# Patient Record
Sex: Female | Born: 1985 | Race: Black or African American | Hispanic: No | Marital: Single | Smoking: Current every day smoker
Health system: Southern US, Community
[De-identification: ages and names within clinical notes are randomized; demographics above are authoritative.]

## PROBLEM LIST (undated history)

## (undated) ENCOUNTER — Ambulatory Visit (HOSPITAL_COMMUNITY): Payer: Medicaid Other

## (undated) DIAGNOSIS — N200 Calculus of kidney: Secondary | ICD-10-CM

## (undated) DIAGNOSIS — J45909 Unspecified asthma, uncomplicated: Secondary | ICD-10-CM

## (undated) DIAGNOSIS — Z7289 Other problems related to lifestyle: Secondary | ICD-10-CM

## (undated) DIAGNOSIS — F32A Depression, unspecified: Secondary | ICD-10-CM

## (undated) DIAGNOSIS — N809 Endometriosis, unspecified: Secondary | ICD-10-CM

## (undated) DIAGNOSIS — I10 Essential (primary) hypertension: Secondary | ICD-10-CM

## (undated) DIAGNOSIS — R040 Epistaxis: Secondary | ICD-10-CM

## (undated) HISTORY — PX: CHOLECYSTECTOMY: SHX55

## (undated) HISTORY — DX: Depression, unspecified: F32.A

## (undated) HISTORY — PX: ABDOMINAL HYSTERECTOMY: SHX81

## (undated) HISTORY — PX: OTHER SURGICAL HISTORY: SHX169

---

## 2011-09-15 ENCOUNTER — Emergency Department (HOSPITAL_COMMUNITY)
Admission: EM | Admit: 2011-09-15 | Discharge: 2011-09-16 | Disposition: A | Discharge status: Home / Self Care | Payer: Medicaid Other | Attending: Emergency Medicine | Admitting: Emergency Medicine

## 2011-09-15 ENCOUNTER — Encounter (HOSPITAL_COMMUNITY): Payer: Self-pay | Admitting: *Deleted

## 2011-09-15 DIAGNOSIS — Q308 Other congenital malformations of nose: Secondary | ICD-10-CM | POA: Insufficient documentation

## 2011-09-15 DIAGNOSIS — R51 Headache: Secondary | ICD-10-CM | POA: Insufficient documentation

## 2011-09-15 DIAGNOSIS — J3489 Other specified disorders of nose and nasal sinuses: Secondary | ICD-10-CM | POA: Insufficient documentation

## 2011-09-15 DIAGNOSIS — R040 Epistaxis: Secondary | ICD-10-CM | POA: Insufficient documentation

## 2011-09-15 DIAGNOSIS — I10 Essential (primary) hypertension: Secondary | ICD-10-CM | POA: Insufficient documentation

## 2011-09-15 MED ORDER — ACETAMINOPHEN 325 MG PO TABS
650.0000 mg | ORAL_TABLET | Freq: Once | ORAL | Status: AC
  Administered 2011-09-16: 650 mg via ORAL
  Filled 2011-09-15: qty 2

## 2011-09-15 MED ORDER — PSEUDOEPHEDRINE HCL ER 120 MG PO TB12: 120.0000 mg | ORAL_TABLET | Freq: Two times a day (BID) | ORAL | Status: AC

## 2011-09-15 MED ORDER — OXYMETAZOLINE HCL 0.05 % NA SOLN: 2.0000 | NASAL | Status: AC | PRN

## 2011-09-15 NOTE — ED Notes (Signed)
Nose bleeding  All day x3.  None at present.  She has had a cold for the past few days.  Headache also

## 2011-09-15 NOTE — ED Notes (Addendum)
No bleeding from the nose at this time. Pt not in any kind of distress. Alert x4.

## 2011-09-16 NOTE — ED Provider Notes (Signed)
Medical screening examination/treatment/procedure(s) were performed by non-physician practitioner and as supervising physician I was immediately available for consultation/collaboration.  Loren Racer, MD 09/16/11 2317

## 2011-09-16 NOTE — ED Provider Notes (Signed)
History     CSN: [id number]  Arrival date & time 09/15/11  2205   First MD Initiated Contact with Patient 09/15/11 2308      Chief Complaint  Patient presents with  . Epistaxis    (Consider location/radiation/quality/duration/timing/severity/associated sxs/prior treatment) HPI History provided by pt.   Pt had three brief episodes of epistaxis today.  Has never had a nose bleed in the past.  Associated w/ pain in her nose and left frontal headache.  Denies trauma.  Has had sinus pressure, nasal congestion and rhinorrhea for the past week.  Is not anti-coagulated.    History reviewed. No pertinent past medical history.  Past Surgical History  Procedure Date  . Cholecystectomy     History reviewed. No pertinent family history.  History  Substance Use Topics  . Smoking status: Current Everyday Smoker  . Smokeless tobacco: Not on file  . Alcohol Use: Yes    OB History    Grav Para Term Preterm Abortions TAB SAB Ect Mult Living                  Review of Systems  All other systems reviewed and are negative.    Allergies  Review of patient's allergies indicates no known allergies.  Home Medications   Current Outpatient Rx  Name Route Sig Dispense Refill  . NYQUIL COLD & FLU PO Oral Take 2 tablets by mouth at bedtime as needed. For cold and sinus symptoms    . OXYMETAZOLINE HCL 0.05 % NA SOLN Nasal Place 2 sprays into the nose as needed (nose bleed). 30 mL 0  . PSEUDOEPHEDRINE HCL ER 120 MG PO TB12 Oral Take 1 tablet (120 mg total) by mouth every 12 (twelve) hours. 20 tablet 0    BP 140/85  Pulse 82  Temp(Src) 98.3 F (36.8 C) (Oral)  Resp 20  SpO2 99%  LMP 08/29/2011  Physical Exam  Nursing note and vitals reviewed. Constitutional: She is oriented to person, place, and time. She appears well-developed and well-nourished. No distress.  HENT:  Head: Normocephalic and atraumatic.       Pt has a congenital deformity of nose.  Mild tenderness at bridge of  nose as well as frontal, ethmoid and maxillary sinuses.  No active epistaxis and nasal mucosa appears normal.    Eyes:       Normal appearance  Neck: Normal range of motion.  Cardiovascular: Normal rate and regular rhythm.   Pulmonary/Chest: Effort normal and breath sounds normal.  Neurological: She is alert and oriented to person, place, and time.  Psychiatric: She has a normal mood and affect. Her behavior is normal.    ED Course  Procedures (including critical care time)  Labs Reviewed - No data to display No results found.   1. Epistaxis   2. Hypertension       MDM  Pt presents w/ intermittent epistaxis today. No trauma.  No active epistaxis on exam.  Has s/sx consistent w/ sinusitis, likely viral based on duration. Explained to patient how to control a nose bleed.  Recommended sudafed for nasal and sinus congestion.  Also discussed complications of uncontrolled HTN and advised compliance with her medications.  Referred to ENT in case of persistent sx.  Return precautions discussed.         Arie Sabina Mosses, [place] 09/16/11 1515

## 2013-05-18 ENCOUNTER — Emergency Department (HOSPITAL_COMMUNITY)
Admission: EM | Admit: 2013-05-18 | Discharge: 2013-05-19 | Disposition: A | Discharge status: Home / Self Care | Payer: Medicaid Other | Attending: Emergency Medicine | Admitting: Emergency Medicine

## 2013-05-18 DIAGNOSIS — S51809A Unspecified open wound of unspecified forearm, initial encounter: Secondary | ICD-10-CM | POA: Insufficient documentation

## 2013-05-18 DIAGNOSIS — Z7289 Other problems related to lifestyle: Secondary | ICD-10-CM

## 2013-05-18 DIAGNOSIS — S61509A Unspecified open wound of unspecified wrist, initial encounter: Secondary | ICD-10-CM | POA: Insufficient documentation

## 2013-05-18 DIAGNOSIS — X789XXA Intentional self-harm by unspecified sharp object, initial encounter: Secondary | ICD-10-CM | POA: Insufficient documentation

## 2013-05-18 DIAGNOSIS — Z3202 Encounter for pregnancy test, result negative: Secondary | ICD-10-CM | POA: Insufficient documentation

## 2013-05-18 LAB — CBC WITH DIFFERENTIAL/PLATELET
Eosinophils Relative: 2 % (ref 0–5)
HCT: 41.3 % (ref 36.0–46.0)
Lymphocytes Relative: 32 % (ref 12–46)
Lymphs Abs: 2.4 10*3/uL (ref 0.7–4.0)
MCV: 82.1 fL (ref 78.0–100.0)
Monocytes Absolute: 0.4 10*3/uL (ref 0.1–1.0)
RDW: 13.9 % (ref 11.5–15.5)
WBC: 7.4 10*3/uL (ref 4.0–10.5)

## 2013-05-18 LAB — RAPID URINE DRUG SCREEN, HOSP PERFORMED
Cocaine: NOT DETECTED
Opiates: NOT DETECTED

## 2013-05-18 LAB — COMPREHENSIVE METABOLIC PANEL
BUN: 9 mg/dL (ref 6–23)
CO2: 27 mEq/L (ref 19–32)
Calcium: 9.9 mg/dL (ref 8.4–10.5)
Creatinine, Ser: 0.91 mg/dL (ref 0.50–1.10)
GFR calc Af Amer: >90 mL/min (ref >90)
GFR calc non Af Amer: 86 mL/min — ABNORMAL LOW (ref >90)
Glucose, Bld: 104 mg/dL — ABNORMAL HIGH (ref 70–99)

## 2013-05-18 LAB — POCT PREGNANCY, URINE: Preg Test, Ur: NEGATIVE

## 2013-05-18 MED ORDER — ONDANSETRON HCL 4 MG PO TABS: 4.0000 mg | ORAL_TABLET | Freq: Three times a day (TID) | ORAL | Status: DC | PRN

## 2013-05-18 MED ORDER — ACETAMINOPHEN 325 MG PO TABS: 650.0000 mg | ORAL_TABLET | ORAL | Status: DC | PRN

## 2013-05-18 MED ORDER — IBUPROFEN 200 MG PO TABS: 600.0000 mg | ORAL_TABLET | Freq: Three times a day (TID) | ORAL | Status: DC | PRN

## 2013-05-18 MED ORDER — ALUM & MAG HYDROXIDE-SIMETH 200-200-20 MG/5ML PO SUSP: 30.0000 mL | ORAL | Status: DC | PRN

## 2013-05-18 MED ORDER — LORAZEPAM 1 MG PO TABS: 1.0000 mg | ORAL_TABLET | Freq: Three times a day (TID) | ORAL | Status: DC | PRN

## 2013-05-18 MED ORDER — ZOLPIDEM TARTRATE 5 MG PO TABS: 5.0000 mg | ORAL_TABLET | Freq: Every evening | ORAL | Status: DC | PRN

## 2013-05-18 MED ORDER — NICOTINE 21 MG/24HR TD PT24: 21.0000 mg | MEDICATED_PATCH | Freq: Every day | TRANSDERMAL | Status: DC | PRN

## 2013-05-18 NOTE — ED Notes (Signed)
EMS called to home.  Patient found on bathroom floor with response to verbal after drinking a whole  Bottle on Nyquil and cutting her wrist.  Stating she wants her husband to get away from her.

## 2013-05-18 NOTE — ED Provider Notes (Signed)
CSN: [id number]     Arrival date & time 05/18/13  2055 History   First MD Initiated Contact with Patient 05/18/13 2102     Chief Complaint  Patient presents with  . Suicide Attempt    nyquil   (Consider location/radiation/quality/duration/timing/severity/associated sxs/prior Treatment) The history is provided by the patient and medical records.   Patient presents to the ED via EMS for OD and self mutilation. Patient is very limited in her responses, mumbling a few words but does not give any details of the events from earlier tonight. Per EMS report patient found on her bathroom floor by her fianc with a razor blade and into bottle of NyQuil.  Patient states she did cut her left wrist and forearm, but states she did it to "calm herself down" not as a suicide attempt. She continuously states "I should not even be here".  I have asked several times what she means by that statement but she will not give further details.  Denies HI or AVH.  States no EtOH or illicit drug use today.  Tetanus UTD.  Spoke with the patient's fiance, states events from earlier occurred after a telephone conversation with her brother.  Fiance states she has no history of prior suicidal attempts. No history of anxiety or depression. States when she is under a great deal of stress, which she has been recently, she tends to isolate herself and will not talk with anyone.  States she has no known issues with EtOH or illicit drug abuse.  No past medical history on file. No past surgical history on file. No family history on file. History  Substance Use Topics  . Smoking status: Not on file  . Smokeless tobacco: Not on file  . Alcohol Use: Not on file   OB History   No data available     Review of Systems  Psychiatric/Behavioral: Positive for self-injury.       OD  All other systems reviewed and are negative.    Allergies  Review of patient's allergies indicates not on file.  Home Medications  No current  outpatient prescriptions on file. BP 155/101  Temp(Src) 98.7 F (37.1 C) (Oral)  Resp 15  SpO2 100%  Physical Exam  Nursing note and vitals reviewed. Constitutional: She is oriented to person, place, and time. She appears well-developed and well-nourished. No distress.  HENT:  Head: Normocephalic and atraumatic.  Mouth/Throat: Oropharynx is clear and moist.  Airway patent  Eyes: Conjunctivae and EOM are normal. Pupils are equal, round, and reactive to light.  Neck: Normal range of motion.  Cardiovascular: Normal rate, regular rhythm and normal heart sounds.   Pulmonary/Chest: Effort normal and breath sounds normal. No respiratory distress. She has no wheezes.  Abdominal: Soft. Bowel sounds are normal. There is no tenderness. There is no guarding.  Musculoskeletal: Normal range of motion.  Multiple superficial scratches to left wrist and forearm; bleeding minimal; no surrounding swelling, erythema, induration, or signs of cellulitis; strong radial pulse, sensation intact  Neurological: She is alert and oriented to person, place, and time. She has normal strength. She displays no tremor. No cranial nerve deficit or sensory deficit. She displays no seizure activity.  CN grossly intact, moves all extremities appropriately without ataxia, no focal neuro deficits or facial droop appreciated  Skin: Skin is warm and dry. She is not diaphoretic.  Psychiatric: She is not actively hallucinating. Thought content is not delusional. She expresses suicidal ideation. She expresses no homicidal ideation. She expresses suicidal plans.  She expresses no homicidal plans.  Pt tearful, limited responses to questioning, staring at her left arm; denies HI or AVH    ED Course  Procedures (including critical care time)   Date: 05/18/2013  Rate: 102  Rhythm: sinus tachycardia  QRS Axis: normal  Intervals: normal  ST/T Wave abnormalities: normal  Conduction Disutrbances:none  Narrative Interpretation:    Old EKG Reviewed: none available   Labs Review Labs Reviewed  COMPREHENSIVE METABOLIC PANEL - Abnormal; Notable for the following:    Potassium 3.4 (*)    Glucose, Bld 104 (*)    Total Bilirubin 0.2 (*)    GFR calc non Af Amer 86 (*)    All other components within normal limits  URINE RAPID DRUG SCREEN (HOSP PERFORMED) - Abnormal; Notable for the following:    Tetrahydrocannabinol POSITIVE (*)    All other components within normal limits  SALICYLATE LEVEL - Abnormal; Notable for the following:    Salicylate Lvl <2.0 (*)    All other components within normal limits  CBC WITH DIFFERENTIAL  ETHANOL  ACETAMINOPHEN LEVEL  POCT PREGNANCY, URINE   Imaging Review No results found.  MDM  No diagnosis found.  Labs as above-- ethanol WNL.  UDS + for cannabis.  Wounds cleansed and bandaged.  Pt has been resting comfortably in bed, VS remained stable.  Pt medically cleared.  Pt denies SI but given events that occurred, will obtain psych consult.  TTS contacted, they will see her.  Psych holding orders placed.  VS stable.  Garlon Hatchet, PA-C 05/19/13 0058  Garlon Hatchet, PA-C 05/19/13 0100

## 2013-05-18 NOTE — ED Notes (Signed)
Patient is lethargic and unwilling to answer questions.  She will make small murmuring comments, but they have no information on how or why she is here.

## 2013-05-18 NOTE — ED Notes (Signed)
Bed: RESA Expected date:  Expected time:  Means of arrival:  Comments: EMS overdose/SI 

## 2013-05-18 NOTE — ED Notes (Signed)
Patient wanded by security and belongings searched.  Lockers full.  Patient belongings at nurses station

## 2013-05-19 ENCOUNTER — Encounter (HOSPITAL_COMMUNITY): Payer: Self-pay | Admitting: *Deleted
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
## 2013-05-19 NOTE — ED Provider Notes (Signed)
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 She should cut herself superficially today and drank some NyQuil. She states was to calm herself down. She has has a history of cutting but no previous suicide attempt. Patient has information for followup at day [facility]. Will discharge. Does not appear to be in acute risk for self at this time.  Samantha Yoder. Rubin Payor, MD 05/19/13 [phone]

## 2013-05-19 NOTE — ED Notes (Signed)
Patient unable to ambulate without assistance.   Will move to phych ED with she is able to ambulate

## 2013-05-19 NOTE — ED Provider Notes (Signed)
Medical screening examination/treatment/procedure(s) were performed by non-physician practitioner and as supervising physician I was immediately available for consultation/collaboration.    Gilda Crease, MD 05/19/13 [phone]

## 2013-05-19 NOTE — ED Notes (Signed)
Bed: WA30 Expected date:  Expected time:  Means of arrival:  Comments: 

## 2013-11-02 ENCOUNTER — Encounter (HOSPITAL_COMMUNITY): Payer: Self-pay | Admitting: Emergency Medicine

## 2013-11-02 ENCOUNTER — Emergency Department (HOSPITAL_COMMUNITY)
Admission: EM | Admit: 2013-11-02 | Discharge: 2013-11-02 | Discharge status: Against Medical Advice | Payer: Medicaid Other | Attending: Emergency Medicine | Admitting: Emergency Medicine

## 2013-11-02 ENCOUNTER — Emergency Department (HOSPITAL_COMMUNITY): Payer: Medicaid Other

## 2013-11-02 DIAGNOSIS — J45909 Unspecified asthma, uncomplicated: Secondary | ICD-10-CM | POA: Insufficient documentation

## 2013-11-02 DIAGNOSIS — F17200 Nicotine dependence, unspecified, uncomplicated: Secondary | ICD-10-CM | POA: Insufficient documentation

## 2013-11-02 DIAGNOSIS — I10 Essential (primary) hypertension: Secondary | ICD-10-CM | POA: Insufficient documentation

## 2013-11-02 DIAGNOSIS — N898 Other specified noninflammatory disorders of vagina: Secondary | ICD-10-CM | POA: Insufficient documentation

## 2013-11-02 HISTORY — DX: Unspecified asthma, uncomplicated: J45.909

## 2013-11-02 HISTORY — DX: Essential (primary) hypertension: I10

## 2013-11-02 NOTE — ED Notes (Signed)
Pt updated.  PT states she is leaving d/t the wait and will come back tomorrow morning.

## 2013-11-02 NOTE — ED Notes (Signed)
Per pt sts that she has a vaginal infection with thick white discharge and foul odor. sts also that her BP has been up the last couple of days. sts she hasn't had her BP meds in 1 month. sts also cough and congestion and is out of her albuterol.

## 2013-11-06 ENCOUNTER — Emergency Department (HOSPITAL_COMMUNITY): Payer: Medicaid Other

## 2013-11-06 ENCOUNTER — Encounter (HOSPITAL_COMMUNITY): Payer: Self-pay | Admitting: Emergency Medicine

## 2013-11-06 ENCOUNTER — Emergency Department (HOSPITAL_COMMUNITY)
Admission: EM | Admit: 2013-11-06 | Discharge: 2013-11-06 | Disposition: A | Discharge status: Home / Self Care | Payer: Medicaid Other | Attending: Emergency Medicine | Admitting: Emergency Medicine

## 2013-11-06 DIAGNOSIS — I10 Essential (primary) hypertension: Secondary | ICD-10-CM | POA: Insufficient documentation

## 2013-11-06 DIAGNOSIS — Z9089 Acquired absence of other organs: Secondary | ICD-10-CM | POA: Insufficient documentation

## 2013-11-06 DIAGNOSIS — J45909 Unspecified asthma, uncomplicated: Secondary | ICD-10-CM | POA: Insufficient documentation

## 2013-11-06 DIAGNOSIS — Z3202 Encounter for pregnancy test, result negative: Secondary | ICD-10-CM | POA: Insufficient documentation

## 2013-11-06 DIAGNOSIS — F17200 Nicotine dependence, unspecified, uncomplicated: Secondary | ICD-10-CM | POA: Insufficient documentation

## 2013-11-06 DIAGNOSIS — N898 Other specified noninflammatory disorders of vagina: Secondary | ICD-10-CM | POA: Insufficient documentation

## 2013-11-06 DIAGNOSIS — Z79899 Other long term (current) drug therapy: Secondary | ICD-10-CM | POA: Insufficient documentation

## 2013-11-06 LAB — CBC WITH DIFFERENTIAL/PLATELET
BASOS ABS: 0 10*3/uL (ref 0.0–0.1)
BASOS PCT: 0 % (ref 0–1)
Eosinophils Absolute: 0.1 10*3/uL (ref 0.0–0.7)
Eosinophils Relative: 2 % (ref 0–5)
HEMATOCRIT: 48.1 % — AB (ref 36.0–46.0)
Hemoglobin: 16.4 g/dL — ABNORMAL HIGH (ref 12.0–15.0)
Lymphocytes Relative: 44 % (ref 12–46)
Lymphs Abs: 2.8 10*3/uL (ref 0.7–4.0)
MCH: 28 pg (ref 26.0–34.0)
MCHC: 34.1 g/dL (ref 30.0–36.0)
MCV: 82.2 fL (ref 78.0–100.0)
MONO ABS: 0.3 10*3/uL (ref 0.1–1.0)
Monocytes Relative: 4 % (ref 3–12)
NEUTROS ABS: 3.1 10*3/uL (ref 1.7–7.7)
NEUTROS PCT: 49 % (ref 43–77)
PLATELETS: 310 10*3/uL (ref 150–400)
RBC: 5.85 MIL/uL — ABNORMAL HIGH (ref 3.87–5.11)
RDW: 14.3 % (ref 11.5–15.5)
WBC: 6.3 10*3/uL (ref 4.0–10.5)

## 2013-11-06 LAB — COMPREHENSIVE METABOLIC PANEL
ALBUMIN: 3.2 g/dL — AB (ref 3.5–5.2)
ALT: 11 U/L (ref 0–35)
AST: 18 U/L (ref 0–37)
Alkaline Phosphatase: 69 U/L (ref 39–117)
BILIRUBIN TOTAL: 0.2 mg/dL — AB (ref 0.3–1.2)
BUN: 9 mg/dL (ref 6–23)
CHLORIDE: 102 meq/L (ref 96–112)
CO2: 25 mEq/L (ref 19–32)
CREATININE: 0.94 mg/dL (ref 0.50–1.10)
Calcium: 8.9 mg/dL (ref 8.4–10.5)
GFR calc Af Amer: >90 mL/min (ref >90)
GFR calc non Af Amer: 82 mL/min — ABNORMAL LOW (ref >90)
Glucose, Bld: 111 mg/dL — ABNORMAL HIGH (ref 70–99)
Potassium: 3.8 mEq/L (ref 3.7–5.3)
Sodium: 140 mEq/L (ref 137–147)
Total Protein: 6.2 g/dL (ref 6.0–8.3)

## 2013-11-06 LAB — URINALYSIS, ROUTINE W REFLEX MICROSCOPIC
Bilirubin Urine: NEGATIVE
GLUCOSE, UA: NEGATIVE mg/dL
Ketones, ur: NEGATIVE mg/dL
LEUKOCYTES UA: NEGATIVE
Nitrite: NEGATIVE
PH: 6 (ref 5.0–8.0)
Protein, ur: NEGATIVE mg/dL
Specific Gravity, Urine: 1.026 (ref 1.005–1.030)
Urobilinogen, UA: 0.2 mg/dL (ref 0.0–1.0)

## 2013-11-06 LAB — URINE MICROSCOPIC-ADD ON

## 2013-11-06 LAB — WET PREP, GENITAL
Clue Cells Wet Prep HPF POC: NONE SEEN
Trich, Wet Prep: NONE SEEN
WBC, Wet Prep HPF POC: NONE SEEN
Yeast Wet Prep HPF POC: NONE SEEN

## 2013-11-06 LAB — POC URINE PREG, ED: Preg Test, Ur: NEGATIVE

## 2013-11-06 MED ORDER — LISINOPRIL 10 MG PO TABS: 20.0000 mg | ORAL_TABLET | Freq: Every day | ORAL | Status: DC

## 2013-11-06 MED ORDER — AEROCHAMBER PLUS W/MASK MISC
Status: DC
Start: 2013-11-06 — End: 2014-04-06

## 2013-11-06 MED ORDER — ALBUTEROL SULFATE HFA 108 (90 BASE) MCG/ACT IN AERS: 2.0000 | INHALATION_SPRAY | RESPIRATORY_TRACT | Status: DC | PRN

## 2013-11-06 NOTE — ED Provider Notes (Signed)
CSN: [id number]     Arrival date & time 11/06/13  1523 History   First MD Initiated Contact with Patient 11/06/13 2033     Chief Complaint  Patient presents with  . Hypertension  . Abdominal Pain     (Consider location/radiation/quality/duration/timing/severity/associated sxs/prior Treatment) HPI Complains of mild crampy low bowel pain for one month accompanied by vaginal discharge for one month. Feels like vaginitis she's had in the past. He is asymptomatic presently. She denies urinary symptoms. Last menstrual period 10/27/2013. She also reports that she ran out of her lisinopril a month ago when she moved here, also ran out of her albuterol one month ago. She is presently asymptomatic she denies cough denies fever denies shortness of breath denies headache no bowel pain at present. No other associated symptoms. Nothing makes symptoms better or worse. Past Medical History  Diagnosis Date  . Hypertension   . Asthma    Past Surgical History  Procedure Laterality Date  . Cholecystectomy     History reviewed. No pertinent family history. History  Substance Use Topics  . Smoking status: Current Every Day Smoker  . Smokeless tobacco: Not on file  . Alcohol Use: Yes     Comment: Social drinker    positive marijuana use OB History   Grav Para Term Preterm Abortions TAB SAB Ect Mult Living                 Review of Systems  Gastrointestinal: Positive for abdominal pain.  Genitourinary: Positive for vaginal discharge.  All other systems reviewed and are negative.      Allergies  Review of patient's allergies indicates no known allergies.  Home Medications   Current Outpatient Rx  Name  Route  Sig  Dispense  Refill  . albuterol (PROVENTIL HFA;VENTOLIN HFA) 108 (90 BASE) MCG/ACT inhaler   Inhalation   Inhale 2 puffs into the lungs every 6 (six) hours as needed for wheezing or shortness of breath.         . lisinopril (PRINIVIL,ZESTRIL) 20 MG tablet   Oral   Take 20 mg  by mouth daily.          BP 135/94  Pulse 86  Temp(Src) 98.8 F (37.1 C) (Oral)  Resp 16  Ht 4\' 11"  (1.499 m)  Wt 187 lb (84.823 kg)  BMI 37.75 kg/m2  SpO2 99%  LMP 10/24/2013 Physical Exam  Nursing note and vitals reviewed. Constitutional: She appears well-developed and well-nourished. No distress.  HENT:  Head: Normocephalic and atraumatic.  Eyes: Conjunctivae are normal. Pupils are equal, round, and reactive to light.  Neck: Neck supple. No tracheal deviation present. No thyromegaly present.  Cardiovascular: Normal rate and regular rhythm.   No murmur heard. Pulmonary/Chest: Effort normal and breath sounds normal.  Abdominal: Soft. Bowel sounds are normal. She exhibits no distension. There is no tenderness.  Obese  Genitourinary:  No external lesion. Cervical os closed. No cervical motion tenderness no adnexal masses or tenderness. Positive slight brown vaginal discharge.  Musculoskeletal: Normal range of motion. She exhibits no edema and no tenderness.  Neurological: She is alert. Coordination normal.  Skin: Skin is warm and dry. No rash noted.  Psychiatric: She has a normal mood and affect.    ED Course  Procedures (including critical care time) Labs Review Labs Reviewed  CBC WITH DIFFERENTIAL - Abnormal; Notable for the following:    RBC 5.85 (*)    Hemoglobin 16.4 (*)    HCT 48.1 (*)  All other components within normal limits  COMPREHENSIVE METABOLIC PANEL - Abnormal; Notable for the following:    Glucose, Bld 111 (*)    Albumin 3.2 (*)    Total Bilirubin 0.2 (*)    GFR calc non Af Amer 82 (*)    All other components within normal limits  URINALYSIS, ROUTINE W REFLEX MICROSCOPIC  POC URINE PREG, ED   Imaging Review No results found.   EKG Interpretation None     11:15 PM patient remains asymptomatic. MDM  No evidence of pelvic infection. Final diagnoses:  None   CT scan of chest ordered as patient had abnormal chest x-ray earlier this week,  and did not wait for full ED evaluation No evidence of vaginitis. I feel the patient is a low risk for lung cancer, 28 year old smoker. She has been advised to stop smoking as it increased risk of cancer. Referral wellness Center in resource guide to get a primary care physician Cervical cultures HIV and RPR pending Safe sex encouraged. Prescriptions lisinopril, albuterol Diagnosis #1 vaginal discharge #2 medication noncompliance     Orlie Dakin, MD 11/07/13 0001

## 2013-11-06 NOTE — Discharge Instructions (Signed)
Stop smoking as smoking can lend itself to lung cancer and heart attacks. Call the wellness Center or any of the numbers on the resource guide to get a primary care physician. You can also call the [facility] at [phone] to get a gynecologist. use a condom each time that you have sex to prevent sexually transmitted diseases.  Emergency Department Resource Guide 1) Find a Doctor and Pay Out of Pocket Although you won't have to find out who is covered by your insurance plan, it is a good idea to ask around and get recommendations. You will then need to call the office and see if the doctor you have chosen will accept you as a new patient and what types of options they offer for patients who are self-pay. Some doctors offer discounts or will set up payment plans for their patients who do not have insurance, but you will need to ask so you aren't surprised when you get to your appointment.  2) Contact Your Local Health Department Not all health departments have doctors that can see patients for sick visits, but many do, so it is worth a call to see if yours does. If you don't know where your local health department is, you can check in your phone book. The CDC also has a tool to help you locate your state's health department, and many state websites also have listings of all of their local health departments.  3) Find a [facility] If your illness is not likely to be very severe or complicated, you may want to try a walk in clinic. These are popping up all over the country in pharmacies, drugstores, and shopping centers. They're usually staffed by nurse practitioners or physician assistants that have been trained to treat common illnesses and complaints. They're usually fairly quick and inexpensive. However, if you have serious medical issues or chronic medical problems, these are probably not your best option.  No Primary Care Doctor: - Call Health Connect at  [phone] - they can help you  locate a primary care doctor that  accepts your insurance, provides certain services, etc. - Physician Referral Service- [phone]  Chronic Pain Problems: Organization         Address  Phone   Notes  [facility]  [phone] Patients need to be referred by their primary care doctor.   Medication Assistance: Organization         Address  Phone   Notes  [facility] Medication [facility] [address] [place], [place] [address] [phone] --Must be a resident of [facility] -- Must have NO insurance coverage whatsoever (no Medicaid/ Medicare, etc.) -- The pt. MUST have a primary care doctor that directs their care regularly and follows them in the community   MedAssist  [phone]   [employer]  [phone]    Agencies that provide inexpensive medical care: Organization         Address  Phone   Notes  [place]  [phone]   [facility]    [phone]   [facility] [place], [place] [address] [phone]   [place] [address] [place] [phone]   Planned Parenthood    [phone]   [facility]    [phone]   [place] and [place], [place] Phone:  [phone],  Fax:  [phone] Hours of Operation:  9 am - 6 pm, M-F.  Also accepts Medicaid/Medicare and self-pay.  [facility] for [place], [address], [place] Phone: [phone], Fax: [phone]. Hours of Operation:  8:30 am - 5:30 pm, M-F.  Also accepts Medicaid and self-pay.  [facility] [place] [address], [place] Phone: [phone]   [place], [place], [place] [phone], Ext. [phone] Mondays & Thursdays: 7-9 AM.  First 15 patients are seen on a first come, first serve basis.    [place]  Providers:  Organization         Address  Phone   Notes  [facility] [address], [place] [phone] Also accepts self-pay patients.  [facility] [id number] [place], [place]  [phone]   [place], [address], [place] [phone]   [facility] [address], [place] [phone]   Lucianne Lei [address], [place]   [phone] Only accepts [place] Access [place] patients after they have their name applied to their card.   Self-Pay (no insurance) in [facility]:  Organization         Address  Phone   Notes  Sickle Cell Patients, [facility] [place] [phone]   [facility] [place] [phone]   [facility] [place]  [place], [place], [place] [phone]   [facility]/Dr. Osei-Bonsu  [address], [place] or [address], [place] [phone] Phone number for both [place] and [place] locations is the same.  Urgent Medical and [facility] [address], [place] [phone]   [facility] [address] [place] or [address] [phone]   [facility] [address], [place] [phone], phone; [phone], fax Sees patients 1st and 3rd Saturday of every month.  Must not qualify for public or private insurance (i.e. Medicaid, Medicare, [place] Health Choice, Veterans' Benefits)  Household income should be no more than 200% of the poverty level The clinic cannot treat you if you are pregnant or think you are pregnant  Sexually transmitted diseases are not treated at the clinic.    Dental Care: Organization         Address  Phone  Notes  [facility] of [facility] [place] [phone] Accepts children up to age 53 who are enrolled in [place] or [place]; pregnant women with a Medicaid card; and children who have applied for Medicaid or [place] Health Choice, but were declined, whose parents can pay a reduced fee at time of service.  [facility]  [address], [place] [phone] Accepts children up to age 75 who are enrolled in [place] or [place]; pregnant women with a Medicaid card; and children who have applied for Medicaid or [place] Health Choice, but were declined, whose parents can pay a reduced fee at time of service.  [facility] Adult [facility] Access PROGRAM  [place] [phone] Patients are seen by appointment only. Walk-ins are not  accepted. [place] will see patients 71 years of age and older. Monday - Tuesday (8am-5pm) Most Wednesdays (8:30-5pm) $30 per visit, cash only  [facility] Adult Dental Access PROGRAM  [address], [facility] [phone] Patients are seen by appointment only. Walk-ins are not accepted. [place] will see patients 36 years of age and older. One Wednesday Evening (Monthly: Volunteer Based).  $30 per visit, cash only  [place]  [phone] for adults; Children under age 68, call Graduate Pediatric Dentistry at [phone]. Children aged 110-14, please call [phone] to request a pediatric application.  Dental services are provided in all areas of dental care including fillings, crowns and bridges, complete and partial dentures, implants, gum treatment, root canals, and extractions. Preventive care is also provided. Treatment is provided to both adults and children. Patients are selected via a lottery and there is often a waiting list.   [facility] [address] [place]  [phone] [email or web address]   [facility]  [address] [place], [place] [phone], Ext. [phone] Second and Fourth Thursday of each month, opens at 6:30 AM; Clinic ends at 9 AM.  Patients are seen on a first-come first-served basis, and a limited number are seen during each clinic.   [facility]  [address] Hillard Danker [place], [place] [phone]   Eligibility Requirements You must have lived in [place], [place], or [place] counties for at least the last three months.   You cannot be eligible for state or federal sponsored [employer], including [employer], [place], or [employer].   You generally cannot be eligible for healthcare insurance through your employer.    How to apply: Eligibility screenings are held every Tuesday and Wednesday afternoon from 1:00 pm until 4:00 pm. You do not need an appointment for the interview!  [facility] [address] [place], [place]   [place]  Christine Department  [place]  [phone]    Behavioral Health Resources in the Community: Intensive Outpatient Programs Organization         Address  Phone  Notes  [facility] [address] [place], [place] [phone]   [facility] [address], [place], [place]   ADS: Alcohol & Drug Svcs [address] [place], [place]   [place] [address]  [place], [place] or [phone]   Substance Abuse Resources Organization         Address  Phone  Notes  Al[facility] and Drug Services  [phone]   [place]  [phone]   [place]   Chinita Pester  [phone]   Residential & Outpatient Substance Abuse Program  [phone]   Psychological Services Organization         Address  Phone  Notes  [facility] [place]  [place]  [phone]   [place] [address], [place] [phone] or [phone]    Mobile Crisis Teams Organization         Address  Phone  Notes  [facility], Mobile Crisis Care Unit  [phone]   [facility]ive Psychotherapeutic Services  [address] [place], [place]   [facility] [address], [place] [phone]    Self-Help/Support  Groups Organization         Address  Phone             Notes  [facility]ental [facility] Assoc. of [place] - variety of support groups  [place] Call for more information  Narcotics Anonymous (NA), [facility] [address], [employer] [place]  2 meetings at this location   Special educational needs teacher         Address  Phone  Notes  [facility] [place],    [place]  [phone])[phone]   [facility]  [address] [place] [address], [place], [place]   [place], [place] [phone] Admissions: 8am-3pm M-F  Incentives Substance [place] [place] [address]    [place], [place] [phone]   [facility] [address] [place], [place], [place]   [facility] [address],  [place], [place]   [facility]ight Programs - Intensive Outpatient [address] Kristeen [address], [place], [place]   [facility] [place].,  [place], [place] [phone] or [phone]   [facility] Services (RTS) [address] [place], [place] Accepts Medicaid  [facility] [place] [address]  [place] [phone] Substance Abuse/Addiction Treatment   [facility] Organization         Address  Phone  Notes  [facility]  [phone]   Domenic Schwab, PhD [address] Arlis Porta [place], [place]   [phone] or [phone]    [place], [place] [phone]   [facility] [address] [place], [place] [phone] Insurance/Medicaid/sponsorship through [facility] and Families [address] [place]                                    [place], [place] [phone] [place] [address], [place] [phone]    Dr. Adele Schilder  [phone]   [facility] Dept. 1) [address] [place] 2) [place] 3)  [place] [address], [place] [phone]  [phone]   [place] [phone] or [phone] (After Hours)

## 2013-11-06 NOTE — ED Notes (Signed)
Pt. Still having intercourse.

## 2013-11-06 NOTE — ED Notes (Addendum)
Pt reports her BP has been elevated for past 2 weeks when she checked it at home. She was on lisinopril but ran out 3 weeks ago. Shes also been congested for past month. Shes also had abd pain and was diagnosed with bacterial vaginosis but was not given any medications to treat it and her pain has gotten worse, shes having abnormal colored discharge. She came in a few days ago to be treated but had to leave to pick up child

## 2013-11-07 LAB — RPR: RPR: NONREACTIVE

## 2013-11-07 LAB — HIV ANTIBODY (ROUTINE TESTING W REFLEX): HIV: NONREACTIVE

## 2013-11-08 LAB — GC/CHLAMYDIA PROBE AMP
CT PROBE, AMP APTIMA: NEGATIVE
GC PROBE AMP APTIMA: NEGATIVE

## 2013-11-09 ENCOUNTER — Emergency Department (HOSPITAL_COMMUNITY): Payer: Medicaid Other

## 2013-11-09 ENCOUNTER — Encounter (HOSPITAL_COMMUNITY): Payer: Self-pay | Admitting: Emergency Medicine

## 2013-11-09 ENCOUNTER — Emergency Department (HOSPITAL_COMMUNITY)
Admission: EM | Admit: 2013-11-09 | Discharge: 2013-11-10 | Disposition: A | Discharge status: Home / Self Care | Payer: Medicaid Other | Attending: Emergency Medicine | Admitting: Emergency Medicine

## 2013-11-09 DIAGNOSIS — Z79899 Other long term (current) drug therapy: Secondary | ICD-10-CM | POA: Insufficient documentation

## 2013-11-09 DIAGNOSIS — K029 Dental caries, unspecified: Secondary | ICD-10-CM | POA: Insufficient documentation

## 2013-11-09 DIAGNOSIS — J45901 Unspecified asthma with (acute) exacerbation: Secondary | ICD-10-CM | POA: Insufficient documentation

## 2013-11-09 DIAGNOSIS — I959 Hypotension, unspecified: Secondary | ICD-10-CM

## 2013-11-09 DIAGNOSIS — R55 Syncope and collapse: Secondary | ICD-10-CM

## 2013-11-09 DIAGNOSIS — I10 Essential (primary) hypertension: Secondary | ICD-10-CM | POA: Insufficient documentation

## 2013-11-09 DIAGNOSIS — J45909 Unspecified asthma, uncomplicated: Secondary | ICD-10-CM

## 2013-11-09 DIAGNOSIS — F17200 Nicotine dependence, unspecified, uncomplicated: Secondary | ICD-10-CM | POA: Insufficient documentation

## 2013-11-09 DIAGNOSIS — R110 Nausea: Secondary | ICD-10-CM | POA: Insufficient documentation

## 2013-11-09 DIAGNOSIS — K089 Disorder of teeth and supporting structures, unspecified: Secondary | ICD-10-CM | POA: Insufficient documentation

## 2013-11-09 LAB — COMPREHENSIVE METABOLIC PANEL
ALK PHOS: 52 U/L (ref 39–117)
ALT: 9 U/L (ref 0–35)
AST: 17 U/L (ref 0–37)
Albumin: 2.6 g/dL — ABNORMAL LOW (ref 3.5–5.2)
BUN: 7 mg/dL (ref 6–23)
CO2: 25 meq/L (ref 19–32)
Calcium: 7.8 mg/dL — ABNORMAL LOW (ref 8.4–10.5)
Chloride: 103 mEq/L (ref 96–112)
Creatinine, Ser: 0.99 mg/dL (ref 0.50–1.10)
GFR, EST AFRICAN AMERICAN: 90 mL/min — AB (ref >90)
GFR, EST NON AFRICAN AMERICAN: 77 mL/min — AB (ref >90)
GLUCOSE: 147 mg/dL — AB (ref 70–99)
POTASSIUM: 3.4 meq/L — AB (ref 3.7–5.3)
SODIUM: 139 meq/L (ref 137–147)
TOTAL PROTEIN: 5 g/dL — AB (ref 6.0–8.3)
Total Bilirubin: <0.2 mg/dL — ABNORMAL LOW (ref 0.3–1.2)

## 2013-11-09 LAB — CBC
HCT: 41.5 % (ref 36.0–46.0)
HEMOGLOBIN: 14.3 g/dL (ref 12.0–15.0)
MCH: 28.2 pg (ref 26.0–34.0)
MCHC: 34.5 g/dL (ref 30.0–36.0)
MCV: 81.9 fL (ref 78.0–100.0)
Platelets: 222 10*3/uL (ref 150–400)
RBC: 5.07 MIL/uL (ref 3.87–5.11)
RDW: 14.3 % (ref 11.5–15.5)
WBC: 7.8 10*3/uL (ref 4.0–10.5)

## 2013-11-09 MED ORDER — ALBUTEROL SULFATE (2.5 MG/3ML) 0.083% IN NEBU
5.0000 mg | INHALATION_SOLUTION | Freq: Once | RESPIRATORY_TRACT | Status: AC
  Administered 2013-11-09: 5 mg via RESPIRATORY_TRACT
  Filled 2013-11-09: qty 6

## 2013-11-09 MED ORDER — ONDANSETRON HCL 4 MG/2ML IJ SOLN
4.0000 mg | Freq: Once | INTRAMUSCULAR | Status: AC
  Administered 2013-11-09: 4 mg via INTRAVENOUS

## 2013-11-09 MED ORDER — ONDANSETRON HCL 4 MG/2ML IJ SOLN
INTRAMUSCULAR | Status: AC
  Filled 2013-11-09: qty 2

## 2013-11-09 MED ORDER — SODIUM CHLORIDE 0.9 % IV BOLUS (SEPSIS)
1000.0000 mL | Freq: Once | INTRAVENOUS | Status: AC
  Administered 2013-11-09: 1000 mL via INTRAVENOUS

## 2013-11-09 NOTE — ED Notes (Signed)
Patient transported to X-ray 

## 2013-11-09 NOTE — ED Provider Notes (Signed)
CSN: [id number]     Arrival date & time 11/09/13  2208 History   First MD Initiated Contact with Patient 11/09/13 2215     Chief Complaint  Patient presents with  . Hypotension     (Consider location/radiation/quality/duration/timing/severity/associated sxs/prior Treatment) The history is provided by the patient and medical records. No language interpreter was used.    Samantha Yoder is a 28 y.o. female  with a hx of asthma, HTN (out of her medications for both > 1 week) presents to the Emergency Department complaining of gradual, persistent, progressively worsening general illness progressing to near syncope onset 30 min prior to arrival.  Pt reports she began donating plasma one week ago. She reports they told her she could donate on Mondays and Fridays. This is her third donation in 1 week.  Patient denies. Will episode, falling or hitting her head.  EMS reports first blood pressure was 56/34. Associated symptoms include nausea and shortness of breath.  Standing and position change make her symptoms worse. Laying flat makes it better. EMS gave 400 cc of fluid prior to arrival and place the patient in Trendelenburg with moderate improvement in her blood pressure..  Pt denies fever, chills, headache, neck pain, chest pain, abdominal pain, vomiting, diarrhea, dysuria, hematuria.  Patient reports cough x1 month and mild shortness of breath for the last several days.  She denies exogenous estrogen, recent travel, recent surgery or swelling in her legs. Patient reports that she smokes every day.     Past Medical History  Diagnosis Date  . Hypertension   . Asthma    Past Surgical History  Procedure Laterality Date  . Cholecystectomy     History reviewed. No pertinent family history. History  Substance Use Topics  . Smoking status: Current Every Day Smoker  . Smokeless tobacco: Not on file  . Alcohol Use: Yes     Comment: Social drinker    OB History   Grav Para Term Preterm Abortions  TAB SAB Ect Mult Living                 Review of Systems  Constitutional: Negative for fever, diaphoresis, appetite change, fatigue and unexpected weight change.  HENT: Positive for dental problem. Negative for mouth sores.   Eyes: Negative for visual disturbance.  Respiratory: Positive for shortness of breath and wheezing. Negative for cough and chest tightness.   Cardiovascular: Negative for chest pain.  Gastrointestinal: Negative for nausea, vomiting, abdominal pain, diarrhea and constipation.  Endocrine: Negative for polydipsia, polyphagia and polyuria.  Genitourinary: Negative for dysuria, urgency, frequency and hematuria.  Musculoskeletal: Negative for back pain and neck stiffness.  Skin: Negative for rash.  Allergic/Immunologic: Negative for immunocompromised state.  Neurological: Positive for syncope (near syncope). Negative for light-headedness and headaches.  Hematological: Does not bruise/bleed easily.  Psychiatric/Behavioral: Negative for sleep disturbance. The patient is not nervous/anxious.       Allergies  Review of patient's allergies indicates no known allergies.  Home Medications   Current Outpatient Rx  Name  Route  Sig  Dispense  Refill  . albuterol (PROVENTIL HFA;VENTOLIN HFA) 108 (90 BASE) MCG/ACT inhaler   Inhalation   Inhale 2 puffs into the lungs every 6 (six) hours as needed for wheezing or shortness of breath.         . lisinopril (PRINIVIL,ZESTRIL) 20 MG tablet   Oral   Take 20 mg by mouth daily.         [place] Kitchen Spacer/Aero-Holding Chambers (AEROCHAMBER PLUS WITH MASK)  inhaler      Use as instructed   1 each   2   . HYDROcodone-acetaminophen (NORCO/VICODIN) 5-325 MG per tablet   Oral   Take 1 tablet by mouth every 4 (four) hours as needed.   6 tablet   0   . penicillin v potassium (VEETID) 500 MG tablet   Oral   Take 1 tablet (500 mg total) by mouth 3 (three) times daily.   30 tablet   0    BP 125/70  Pulse 91  Resp 20  SpO2 99%   LMP 10/24/2013 Physical Exam  Nursing note and vitals reviewed. Constitutional: She is oriented to person, place, and time. She appears well-developed and well-nourished. No distress.  Awake, alert, nontoxic appearance  HENT:  Head: Normocephalic and atraumatic.  Mouth/Throat: Uvula is midline, oropharynx is clear and moist and mucous membranes are normal. Abnormal dentition. No dental abscesses or uvula swelling. No oropharyngeal exudate, posterior oropharyngeal edema, posterior oropharyngeal erythema or tonsillar abscesses.    Tooth #20 were given without erythema, induration or evidence of abscess  Eyes: Conjunctivae and EOM are normal. Pupils are equal, round, and reactive to light. No scleral icterus.  Neck: Normal range of motion. Neck supple.  Cardiovascular: Normal rate, regular rhythm, normal heart sounds and intact distal pulses.   Pulmonary/Chest: Effort normal. No accessory muscle usage. Not tachypneic. No respiratory distress. She has no decreased breath sounds. She has wheezes. She has no rhonchi. She has no rales.  No respiratory distress, mild wheezes throughout, no rhonchi or rales  Abdominal: Soft. Bowel sounds are normal. She exhibits no mass. There is no tenderness. There is no rebound and no guarding.  Musculoskeletal: Normal range of motion. She exhibits no edema.  Lymphadenopathy:    She has no cervical adenopathy.  Neurological: She is alert and oriented to person, place, and time. She has normal reflexes. No cranial nerve deficit. She exhibits normal muscle tone. Coordination normal.  Speech is clear and goal oriented, follows commands Cranial nerves III - XII without deficit, no facial droop Normal strength in upper and lower extremities bilaterally, strong and equal grip strength Sensation normal to light and sharp touch Moves extremities without ataxia, coordination intact Normal finger to nose and rapid alternating movements Neg romberg, no pronator  drift Normal gait Normal heel-shin and balance   Skin: Skin is warm and dry. No rash noted. She is not diaphoretic.  Psychiatric: She has a normal mood and affect. Her behavior is normal. Judgment and thought content normal.    ED Course  Procedures (including critical care time) Labs Review Labs Reviewed  COMPREHENSIVE METABOLIC PANEL - Abnormal; Notable for the following:    Potassium 3.4 (*)    Glucose, Bld 147 (*)    Calcium 7.8 (*)    Total Protein 5.0 (*)    Albumin 2.6 (*)    Total Bilirubin <0.2 (*)    GFR calc non Af Amer 77 (*)    GFR calc Af Amer 90 (*)    All other components within normal limits  CBC   Imaging Review Dg Chest 2 View  11/09/2013   CLINICAL DATA:  Shortness of breath, wheezing.  EXAM: CHEST  2 VIEW  COMPARISON:  November 02, 2013.  FINDINGS: The heart size and mediastinal contours are within normal limits. Both lungs are clear. No pleural effusion or pneumothorax is noted. The visualized skeletal structures are unremarkable.  IMPRESSION: No acute cardiopulmonary abnormality seen.   Electronically Signed  By: Sabino Dick M.D.   On: 11/09/2013 23:00     EKG Interpretation None     ECG:  Date: 11/10/2013  Rate: 86  Rhythm: normal sinus rhythm  QRS Axis: normal  Intervals: normal  ST/T Wave abnormalities: normal  Conduction Disutrbances:none  Narrative Interpretation: Nonischemic ECG, unchanged from 05/17/2013  Old EKG Reviewed: unchanged    MDM   Final diagnoses:  Hypotension  Nausea  Near syncope  Pain due to dental caries   Samantha Yoder presents hypotensive after for the third time in one week.  EMS reports systolic blood pressure in the 50s however after 400 mL she presents here with blood pressure in the 100s. Patient given 2 L of fluid. Basic labs checked and unremarkable. Patient with negative orthostatics after fluid challenge.  Nonischemic ECG in no personal or family history of cardiac disease.  Pt without full syncopal episode.   Patient advised not to donate plasma for a minimum of 3 weeks but highly recommend followup with primary care physician before any further plasma donation.    She also a history of asthma and out of her asthma medications. She presents with mild wheezing on exam and mild shortness of breath.  Clear and equal breath sounds immediately after one nebulizer treatment.  Patient reports this does not feel like an asthma exacerbation.  She reports wheezing only because she has been out of her home medications.  Chest x-ray without evidence of pneumonia.  Pt given MDI for home use.  I personally reviewed the imaging tests through PACS system  I reviewed available ER/hospitalization records through the EMR  Patient also with toothache.  No gross abscess.  Exam unconcerning for Ludwig's angina or spread of infection.  Will treat with penicillin and pain medicine.  Urged patient to follow-up with dentist.    It has been determined that no acute conditions requiring further emergency intervention are present at this time. The patient/guardian have been advised of the diagnosis and plan. We have discussed signs and symptoms that warrant return to the ED, such as changes or worsening in symptoms.   Vital signs are stable at discharge.   BP 125/70  Pulse 91  Resp 20  SpO2 99%  LMP 10/24/2013  Patient/guardian has voiced understanding and agreed to follow-up with the PCP or specialist.       Abigail Butts, PA-C 11/10/13 [phone]

## 2013-11-09 NOTE — ED Notes (Signed)
Pt gave plasma today; did not eat or drink well; pt diaphoretic; hypotensive 24'S systolic.

## 2013-11-09 NOTE — ED Notes (Signed)
Pt eatting and drinking without nausea or vomiting. Does have tooth pain.

## 2013-11-10 MED ORDER — PENICILLIN V POTASSIUM 500 MG PO TABS: 500.0000 mg | ORAL_TABLET | Freq: Three times a day (TID) | ORAL | Status: DC

## 2013-11-10 MED ORDER — ALBUTEROL SULFATE HFA 108 (90 BASE) MCG/ACT IN AERS
2.0000 | INHALATION_SPRAY | Freq: Once | RESPIRATORY_TRACT | Status: AC
  Administered 2013-11-10: 2 via RESPIRATORY_TRACT
  Filled 2013-11-10: qty 6.7

## 2013-11-10 MED ORDER — HYDROCODONE-ACETAMINOPHEN 5-325 MG PO TABS: 1.0000 | ORAL_TABLET | ORAL | Status: DC | PRN

## 2013-11-10 NOTE — Discharge Instructions (Signed)
1. Medications: penicillin, vicodin, usual home medications 2. Treatment: rest, drink plenty of fluids, do not donate plasma for 3 weeks 3. Follow Up: Please followup with your primary doctor for discussion of your diagnoses and further evaluation after today's visit; if you do not have a primary care doctor use the resource guide provided to find one;    Dental Pain A tooth ache may be caused by cavities (tooth decay). Cavities expose the nerve of the tooth to air and hot or cold temperatures. It may come from an infection or abscess (also called a boil or furuncle) around your tooth. It is also often caused by dental caries (tooth decay). This causes the pain you are having. DIAGNOSIS  Your caregiver can diagnose this problem by exam. TREATMENT   If caused by an infection, it may be treated with medications which kill germs (antibiotics) and pain medications as prescribed by your caregiver. Take medications as directed.  Only take over-the-counter or prescription medicines for pain, discomfort, or fever as directed by your caregiver.  Whether the tooth ache today is caused by infection or dental disease, you should see your dentist as soon as possible for further care. SEEK MEDICAL CARE IF: The exam and treatment you received today has been provided on an emergency basis only. This is not a substitute for complete medical or dental care. If your problem worsens or new problems (symptoms) appear, and you are unable to meet with your dentist, call or return to this location. SEEK IMMEDIATE MEDICAL CARE IF:   You have a fever.  You develop redness and swelling of your face, jaw, or neck.  You are unable to open your mouth.  You have severe pain uncontrolled by pain medicine. MAKE SURE YOU:   Understand these instructions.  Will watch your condition.  Will get help right away if you are not doing well or get worse. Document Released: 08/20/2005 Document Revised: 11/12/2011 Document  Reviewed: 04/07/2008 [facility] Patient Information 2014 [place].  Hypotension As your heart beats, it forces blood through your arteries. This force is your blood pressure. If your blood pressure is too low for you to go about your normal activities or to support the organs of your body, you have hypotension. Hypotension is also referred to as low blood pressure. When your blood pressure becomes too low, you may not get enough blood to your brain. As a result, you may feel weak, feel lightheaded, or develop a rapid heart rate. In a more severe case, you may faint. CAUSES Various conditions can cause hypotension. These include:  Blood loss.  Dehydration.  Heart or endocrine problems.  Pregnancy.  Severe infection.  Not having a well-balanced diet filled with needed nutrients.  Severe allergic reactions (anaphylaxis). Some medicines, such as blood pressure medicine or water pills (diuretics), may lower your blood pressure below normal. Sometimes taking too much medicine or taking medicine not as directed can cause hypotension. TREATMENT  Hospitalization is sometimes required for hypotension if fluid or blood replacement is needed, if time is needed for medicines to wear off, or if further monitoring is needed. Treatment might include changing your diet, changing your medicines (including medicines aimed at raising your blood pressure), and use of support stockings. HOME CARE INSTRUCTIONS   Drink enough fluids to keep your urine clear or pale yellow.  Take your medicines as directed by your health care provider.  Get up slowly from reclining or sitting positions. This gives your blood pressure a chance to adjust.  Wear support stockings as directed by your health care provider.  Maintain a healthy diet by including nutritious food, such as fruits, vegetables, nuts, whole grains, and lean meats. SEEK MEDICAL CARE IF:  You have vomiting or diarrhea.  You have a fever for more  than 2 3 days.  You feel more thirsty than usual.  You feel weak and tired. SEEK IMMEDIATE MEDICAL CARE IF:   You have chest pain or a fast or irregular heartbeat.  You have a loss of feeling in some part of your body, or you lose movement in your arms or legs.  You have trouble speaking.  You become sweaty or feel lightheaded.  You faint. MAKE SURE YOU:   Understand these instructions.  Will watch your condition.  Will get help right away if you are not doing well or get worse. Document Released: 08/20/2005 Document Revised: 06/10/2013 Document Reviewed: 02/20/2013 [facility] Patient Information 2014 [place].    Emergency Department Resource Guide 1) Find a Doctor and Pay Out of Pocket Although you won't have to find out who is covered by your insurance plan, it is a good idea to ask around and get recommendations. You will then need to call the office and see if the doctor you have chosen will accept you as a new patient and what types of options they offer for patients who are self-pay. Some doctors offer discounts or will set up payment plans for their patients who do not have insurance, but you will need to ask so you aren't surprised when you get to your appointment.  2) Contact Your Local Health Department Not all health departments have doctors that can see patients for sick visits, but many do, so it is worth a call to see if yours does. If you don't know where your local health department is, you can check in your phone book. The CDC also has a tool to help you locate your state's health department, and many state websites also have listings of all of their local health departments.  3) Find a [facility] If your illness is not likely to be very severe or complicated, you may want to try a walk in clinic. These are popping up all over the country in pharmacies, drugstores, and shopping centers. They're usually staffed by nurse practitioners or physician  assistants that have been trained to treat common illnesses and complaints. They're usually fairly quick and inexpensive. However, if you have serious medical issues or chronic medical problems, these are probably not your best option.  No Primary Care Doctor: - Call Health Connect at  [phone] - they can help you locate a primary care doctor that  accepts your insurance, provides certain services, etc. - Physician Referral Service- [phone]  Chronic Pain Problems: Organization         Address  Phone   Notes  [facility]  [phone] Patients need to be referred by their primary care doctor.   Medication Assistance: Organization         Address  Phone   Notes  [facility] Medication [facility] [place], [place] [address] [phone] --Must be a resident of [facility] -- Must have NO insurance coverage whatsoever (no Medicaid/ Medicare, etc.) -- The pt. MUST have a primary care doctor that directs their care regularly and follows them in the community   MedAssist  [phone]   [employer]  [phone]    Agencies  that provide inexpensive medical care: Organization         Address  Phone   Notes  [place]  [phone]   [facility]    [phone]   [facility] [place], [place] [address] [phone]   [place] [address], [place] [phone]   Planned Parenthood    [phone]   [facility]    [phone]   [place] and [place] [address], [place] Phone:  [phone], Fax:  [phone] Hours of Operation:  9 am - 6 pm, M-F.  Also accepts Medicaid/Medicare and self-pay.  [facility] for [place], [address], [place] Phone: [phone], Fax: [phone]. Hours of Operation:  8:30 am - 5:30 pm, M-F.  Also accepts  Medicaid and self-pay.  [facility] [address] [place] Phone: [phone]   [place], [place], [place] [phone], Ext. [phone] Mondays & Thursdays: 7-9 AM.  First 15 patients are seen on a first come, first serve basis.    [place] Providers:  Organization         Address  Phone   Notes  [facility] [address], [place] [phone] Also accepts self-pay patients.  [facility] [address], [place]  [phone]   [place], [address], [place] [phone]   [facility] [address], [place] [phone]   Lucianne Lei [address], [place]   [phone] Only accepts [place] Access [place] patients after they have their name applied to their card.   Self-Pay (no insurance) in [facility]:  Organization         Address  Phone   Notes  Sickle Cell Patients, [facility] [place] [phone]   [facility] [place] [phone]   [facility] [place]  [place], [place], [place] [phone]   [facility]/Dr. Osei-Bonsu  [address] [place] or [address], [place] [phone] Phone number for both [place] and [place] locations is the same.  Urgent Medical and [facility] [address], [place] [phone]   [facility] [address], [place] or [address] [phone]   [facility] [address] [place] [phone], phone; [phone], fax Sees patients 1st and 3rd Saturday of every month.  Must not qualify for public or private insurance (i.e. Medicaid, Medicare, [place] Health Choice, Veterans' Benefits)  Household  income should be no more than 200% of the poverty level The clinic cannot treat you if you are pregnant or think you are pregnant  Sexually transmitted diseases are not treated at the clinic.    Dental Care: Organization         Address  Phone  Notes  [facility] Department of [facility] [place] [phone] Accepts children up to age 25 who are enrolled in [place] or [place]; pregnant women with a Medicaid card; and children who have applied for Medicaid or [place]  Health Choice, but were declined, whose parents can pay a reduced fee at time of service.  [facility] Department of [facility]  [address], [place] [phone] Accepts children up to age 28 who are enrolled in [place] or [place]; pregnant women with a Medicaid card; and children who have applied for Medicaid or [place] Health Choice, but were declined, whose parents can pay a reduced fee at time of service.  [place] Adult Dental Access PROGRAM  [place] [phone] Patients are seen by appointment only. Walk-ins are not accepted. [place] will see patients 78 years of age and older. Monday - Tuesday (8am-5pm) Most Wednesdays (8:30-5pm) $30 per visit, cash only  [facility] Adult Dental Access PROGRAM  [address], [facility] [phone] Patients are seen by appointment only. Walk-ins are not accepted. [place] will see patients 104 years of age and older. One Wednesday Evening (Monthly: Volunteer Based).  $30 per visit, cash only  [place]  [phone] for adults; Children under age 53, call Graduate Pediatric Dentistry at [phone]. Children aged 58-14, please call [phone] to request a pediatric application.  Dental services are provided in all areas of dental care including fillings, crowns and bridges, complete and partial dentures, implants, gum  treatment, root canals, and extractions. Preventive care is also provided. Treatment is provided to both adults and children. Patients are selected via a lottery and there is often a waiting list.   [facility] [address], [place]  [phone] [email or web address]   [facility] [address] [place], [place] [phone], Ext. [phone] Second and Fourth Thursday of each month, opens at 6:30 AM; Clinic ends at 9 AM.  Patients are seen on a first-come first-served basis, and a limited number are seen during each clinic.   [facility]  [address] Hillard Danker [place], [place] [phone]   Eligibility Requirements You must have lived in [place], [place], or [place] counties for at least the last three months.   You cannot be eligible for state or federal sponsored [employer], including [employer], [place], or [employer].   You generally cannot be eligible for healthcare insurance through your employer.    How to apply: Eligibility screenings are held every Tuesday and Wednesday afternoon from 1:00 pm until 4:00 pm. You do not need an appointment for the interview!  [facility] [address] [place], [place]   [place]  [place] Department  [place]  [phone]    Behavioral Health Resources in the Community: Intensive Outpatient Programs Organization         Address  Phone  Notes  [place]. [address], [place], [place] [phone]   [facility] [address] [place], [place]   ADS: Alcohol & Drug Svcs [address], [place], [place]   [employer] [address]  [place], [place] or [phone]   Substance Abuse Resources Organization         Address  Phone  Notes  Alcohol and  Drug Services  [phone]   [facility]  [phone]   [facility] [place]  [phone]   Chinita Pester  [phone]   Residential & Outpatient Substance Abuse Program  [phone]  Psychological Services Organization         Address  Phone  Notes  [facility] [place]  [place]  [phone]   [place] [address] [place] or [phone]    Mobile Crisis Teams Organization         Address  Phone  Notes  [facility]atives, Mobile Crisis Care Unit  [phone]   [facility]  [address] [place], [place]   [place] [address], [place] [phone]    Self-Help/Support Groups Organization         Address  Phone             Notes  [place]. of [place] - variety of support groups  [place] Call for more information  Narcotics Anonymous (NA), [facility] [address], [employer] [place]  2 meetings at this location   Special educational needs teacher         Address  Phone  Notes  [facility]AP Residential Treatment [place],    [place]  [phone]   [facility]  [address], [place] [address], [place], [place]   [place], [place] [phone] Admissions: 8am-3pm M-F  Incentives Substance [place] [address]    [place], [place] [phone]   [facility] [address] [place], [place], [place]   [facility] [address],  [place], [place]   [facility]ight Programs - Intensive Outpatient [address], [place], [place]   [facility] ([place].) [place].,  [place], [place] [phone] or [phone]   [facility] (RTS) [address], [place], [place] Accepts Medicaid  Fellowship  [place] [address],  [place] [phone] Substance Abuse/Addiction Treatment   [facility] Organization         Address  Phone  Notes  [facility]  [phone]   Domenic Schwab, PhD [address] Arlis [place], [place]   [phone] or [phone]   [place], [place] [phone]   [facility] [address], [place], [place] [phone] Insurance/Medicaid/sponsorship through [facility] and Families [address], [address] [place]                                    [place], [place] [phone] [place] [address] [place], [place] [phone]    Dr. Adele Schilder  [phone]   [facility] Dept. 1) [address] [place] 2) [place] 3)  [place] [address], [place] [phone]  [phone]   [place] [phone] or [phone] (After Hours)

## 2013-11-11 NOTE — ED Provider Notes (Signed)
Medical screening examination/treatment/procedure(s) were performed by non-physician practitioner and as supervising physician I was immediately available for consultation/collaboration.   EKG Interpretation   Date/Time:  Monday November 09 2013 22:18:02 EDT Ventricular Rate:  86 PR Interval:  170 QRS Duration: 90 QT Interval:  378 QTC Calculation: 452 R Axis:   50 Text Interpretation:  Sinus rhythm possible left atrial enlargement  Otherwise normal ECG No previous tracing Reconfirmed by [facility]  MD, Hoover Browns  ([id number]) on 11/11/2013 8:39:15 PM        Saddie Benders. Dorna Mai, MD 11/11/13 2039

## 2014-02-21 ENCOUNTER — Encounter (HOSPITAL_COMMUNITY): Payer: Self-pay | Admitting: Emergency Medicine

## 2014-02-21 ENCOUNTER — Emergency Department (HOSPITAL_COMMUNITY)
Admission: EM | Admit: 2014-02-21 | Discharge: 2014-02-21 | Discharge status: Against Medical Advice | Payer: Medicaid Other | Attending: Emergency Medicine | Admitting: Emergency Medicine

## 2014-02-21 DIAGNOSIS — K089 Disorder of teeth and supporting structures, unspecified: Secondary | ICD-10-CM | POA: Insufficient documentation

## 2014-02-21 DIAGNOSIS — F17200 Nicotine dependence, unspecified, uncomplicated: Secondary | ICD-10-CM | POA: Insufficient documentation

## 2014-02-21 DIAGNOSIS — I10 Essential (primary) hypertension: Secondary | ICD-10-CM | POA: Insufficient documentation

## 2014-02-21 DIAGNOSIS — J45909 Unspecified asthma, uncomplicated: Secondary | ICD-10-CM | POA: Insufficient documentation

## 2014-02-21 DIAGNOSIS — N898 Other specified noninflammatory disorders of vagina: Secondary | ICD-10-CM | POA: Insufficient documentation

## 2014-02-21 LAB — URINALYSIS, ROUTINE W REFLEX MICROSCOPIC
BILIRUBIN URINE: NEGATIVE
GLUCOSE, UA: NEGATIVE mg/dL
Ketones, ur: NEGATIVE mg/dL
Leukocytes, UA: NEGATIVE
Nitrite: NEGATIVE
PH: 5.5 (ref 5.0–8.0)
Protein, ur: NEGATIVE mg/dL
Specific Gravity, Urine: 1.027 (ref 1.005–1.030)
Urobilinogen, UA: 1 mg/dL (ref 0.0–1.0)

## 2014-02-21 LAB — COMPREHENSIVE METABOLIC PANEL
ALBUMIN: 3.9 g/dL (ref 3.5–5.2)
ALK PHOS: 88 U/L (ref 39–117)
ALT: 19 U/L (ref 0–35)
AST: 21 U/L (ref 0–37)
BILIRUBIN TOTAL: 0.2 mg/dL — AB (ref 0.3–1.2)
BUN: 9 mg/dL (ref 6–23)
CHLORIDE: 101 meq/L (ref 96–112)
CO2: 22 mEq/L (ref 19–32)
Calcium: 10 mg/dL (ref 8.4–10.5)
Creatinine, Ser: 0.98 mg/dL (ref 0.50–1.10)
GFR calc Af Amer: >90 mL/min (ref >90)
GFR calc non Af Amer: 78 mL/min — ABNORMAL LOW (ref >90)
Glucose, Bld: 93 mg/dL (ref 70–99)
POTASSIUM: 3.5 meq/L — AB (ref 3.7–5.3)
SODIUM: 140 meq/L (ref 137–147)
TOTAL PROTEIN: 8.2 g/dL (ref 6.0–8.3)

## 2014-02-21 LAB — CBC WITH DIFFERENTIAL/PLATELET
BASOS PCT: 0 % (ref 0–1)
Basophils Absolute: 0 10*3/uL (ref 0.0–0.1)
Eosinophils Absolute: 0.2 10*3/uL (ref 0.0–0.7)
Eosinophils Relative: 2 % (ref 0–5)
HCT: 40.5 % (ref 36.0–46.0)
HEMOGLOBIN: 13.9 g/dL (ref 12.0–15.0)
Lymphocytes Relative: 34 % (ref 12–46)
Lymphs Abs: 2.9 10*3/uL (ref 0.7–4.0)
MCH: 28.2 pg (ref 26.0–34.0)
MCHC: 34.3 g/dL (ref 30.0–36.0)
MCV: 82.2 fL (ref 78.0–100.0)
Monocytes Absolute: 0.4 10*3/uL (ref 0.1–1.0)
Monocytes Relative: 4 % (ref 3–12)
NEUTROS ABS: 5.1 10*3/uL (ref 1.7–7.7)
NEUTROS PCT: 60 % (ref 43–77)
PLATELETS: 279 10*3/uL (ref 150–400)
RBC: 4.93 MIL/uL (ref 3.87–5.11)
RDW: 14.3 % (ref 11.5–15.5)
WBC: 8.5 10*3/uL (ref 4.0–10.5)

## 2014-02-21 LAB — URINE MICROSCOPIC-ADD ON

## 2014-02-21 LAB — PREGNANCY, URINE: Preg Test, Ur: NEGATIVE

## 2014-02-21 LAB — LIPASE, BLOOD: LIPASE: 26 U/L (ref 11–59)

## 2014-02-21 NOTE — ED Notes (Signed)
The pt is  C/o a toothache for 3 days.  She has a cold  With chest ciongestion for 4 days and she also wants to bec=seen for a vaginal discharge that she has had for 7 daYS.Marland Kitchen lmp may 25

## 2014-02-21 NOTE — ED Notes (Signed)
Pt gave pager to registration and told them she was leaving.

## 2014-04-06 ENCOUNTER — Emergency Department (HOSPITAL_COMMUNITY)
Admission: EM | Admit: 2014-04-06 | Discharge: 2014-04-07 | Disposition: A | Discharge status: Home / Self Care | Payer: Medicaid Other | Attending: Emergency Medicine | Admitting: Emergency Medicine

## 2014-04-06 ENCOUNTER — Encounter (HOSPITAL_COMMUNITY): Payer: Self-pay | Admitting: Emergency Medicine

## 2014-04-06 DIAGNOSIS — Z9089 Acquired absence of other organs: Secondary | ICD-10-CM | POA: Diagnosis not present

## 2014-04-06 DIAGNOSIS — Z3202 Encounter for pregnancy test, result negative: Secondary | ICD-10-CM | POA: Diagnosis not present

## 2014-04-06 DIAGNOSIS — I10 Essential (primary) hypertension: Secondary | ICD-10-CM | POA: Diagnosis not present

## 2014-04-06 DIAGNOSIS — N83209 Unspecified ovarian cyst, unspecified side: Secondary | ICD-10-CM | POA: Diagnosis not present

## 2014-04-06 DIAGNOSIS — R1030 Lower abdominal pain, unspecified: Secondary | ICD-10-CM

## 2014-04-06 DIAGNOSIS — R109 Unspecified abdominal pain: Secondary | ICD-10-CM | POA: Insufficient documentation

## 2014-04-06 DIAGNOSIS — F17200 Nicotine dependence, unspecified, uncomplicated: Secondary | ICD-10-CM | POA: Insufficient documentation

## 2014-04-06 DIAGNOSIS — Z8659 Personal history of other mental and behavioral disorders: Secondary | ICD-10-CM | POA: Insufficient documentation

## 2014-04-06 DIAGNOSIS — J45909 Unspecified asthma, uncomplicated: Secondary | ICD-10-CM | POA: Diagnosis not present

## 2014-04-06 DIAGNOSIS — N83201 Unspecified ovarian cyst, right side: Secondary | ICD-10-CM

## 2014-04-06 DIAGNOSIS — N83202 Unspecified ovarian cyst, left side: Secondary | ICD-10-CM

## 2014-04-06 HISTORY — DX: Epistaxis: R04.0

## 2014-04-06 HISTORY — DX: Other problems related to lifestyle: Z72.89

## 2014-04-06 LAB — URINALYSIS, ROUTINE W REFLEX MICROSCOPIC
BILIRUBIN URINE: NEGATIVE
GLUCOSE, UA: NEGATIVE mg/dL
HGB URINE DIPSTICK: NEGATIVE
Ketones, ur: NEGATIVE mg/dL
Leukocytes, UA: NEGATIVE
Nitrite: NEGATIVE
PROTEIN: NEGATIVE mg/dL
Specific Gravity, Urine: 1.018 (ref 1.005–1.030)
Urobilinogen, UA: 0.2 mg/dL (ref 0.0–1.0)
pH: 7 (ref 5.0–8.0)

## 2014-04-06 LAB — CBC WITH DIFFERENTIAL/PLATELET
BASOS PCT: 0 % (ref 0–1)
Basophils Absolute: 0 10*3/uL (ref 0.0–0.1)
Eosinophils Absolute: 0.1 10*3/uL (ref 0.0–0.7)
Eosinophils Relative: 2 % (ref 0–5)
HCT: 39.3 % (ref 36.0–46.0)
HEMOGLOBIN: 13.3 g/dL (ref 12.0–15.0)
Lymphocytes Relative: 44 % (ref 12–46)
Lymphs Abs: 3.5 10*3/uL (ref 0.7–4.0)
MCH: 28.1 pg (ref 26.0–34.0)
MCHC: 33.8 g/dL (ref 30.0–36.0)
MCV: 82.9 fL (ref 78.0–100.0)
MONO ABS: 0.6 10*3/uL (ref 0.1–1.0)
Monocytes Relative: 7 % (ref 3–12)
NEUTROS ABS: 3.8 10*3/uL (ref 1.7–7.7)
NEUTROS PCT: 47 % (ref 43–77)
Platelets: 323 10*3/uL (ref 150–400)
RBC: 4.74 MIL/uL (ref 3.87–5.11)
RDW: 14.3 % (ref 11.5–15.5)
WBC: 8 10*3/uL (ref 4.0–10.5)

## 2014-04-06 LAB — COMPREHENSIVE METABOLIC PANEL
ALK PHOS: 83 U/L (ref 39–117)
ALT: 15 U/L (ref 0–35)
AST: 20 U/L (ref 0–37)
Albumin: 3.9 g/dL (ref 3.5–5.2)
Anion gap: 13 (ref 5–15)
BUN: 7 mg/dL (ref 6–23)
CO2: 26 mEq/L (ref 19–32)
CREATININE: 1.01 mg/dL (ref 0.50–1.10)
Calcium: 9.2 mg/dL (ref 8.4–10.5)
Chloride: 100 mEq/L (ref 96–112)
GFR calc Af Amer: 88 mL/min — ABNORMAL LOW (ref >90)
GFR, EST NON AFRICAN AMERICAN: 76 mL/min — AB (ref >90)
Glucose, Bld: 100 mg/dL — ABNORMAL HIGH (ref 70–99)
POTASSIUM: 3.8 meq/L (ref 3.7–5.3)
Sodium: 139 mEq/L (ref 137–147)
Total Bilirubin: 0.2 mg/dL — ABNORMAL LOW (ref 0.3–1.2)
Total Protein: 7.4 g/dL (ref 6.0–8.3)

## 2014-04-06 LAB — PREGNANCY, URINE: Preg Test, Ur: NEGATIVE

## 2014-04-06 MED ORDER — ONDANSETRON HCL 4 MG/2ML IJ SOLN
4.0000 mg | Freq: Once | INTRAMUSCULAR | Status: AC
  Administered 2014-04-06: 4 mg via INTRAVENOUS
  Filled 2014-04-06: qty 2

## 2014-04-06 MED ORDER — FENTANYL CITRATE 0.05 MG/ML IJ SOLN
50.0000 ug | Freq: Once | INTRAMUSCULAR | Status: AC
  Administered 2014-04-06: 50 ug via INTRAVENOUS
  Filled 2014-04-06: qty 2

## 2014-04-06 NOTE — ED Notes (Signed)
Pt. reports RLQ pain onset this evening , denies nausea/vomittting or diarrhea . No fever or chills.

## 2014-04-06 NOTE — ED Provider Notes (Signed)
CSN: [id number]     Arrival date & time 04/06/14  2032 History   First MD Initiated Contact with Patient 04/06/14 2223     Chief Complaint  Patient presents with  . Abdominal Pain     (Consider location/radiation/quality/duration/timing/severity/associated sxs/prior Treatment) HPI Comments: Patient presents to the emergency department with chief complaint of abdominal pain. She states pain started this morning. She states pain has been constant since. She states pain is moderate in severity. She denies any associated nausea, vomiting, or diarrhea. Denies any fevers or chills. She denies any dysuria. She has not tried taking anything to alleviate her symptoms. She reports having a bowel movement earlier today, but states that it was bright red. She is uncertain if this was because of something she, or if it was bloody. She has a past surgical history remarkable for cholecystectomy.  The history is provided by the patient. No language interpreter was used.    Past Medical History  Diagnosis Date  . Hypertension   . Asthma   . Deliberate self-cutting   . Epistaxis    Past Surgical History  Procedure Laterality Date  . Cholecystectomy     No family history on file. History  Substance Use Topics  . Smoking status: Current Every Day Smoker  . Smokeless tobacco: Not on file  . Alcohol Use: Yes     Comment: Social drinker    OB History   Grav Para Term Preterm Abortions TAB SAB Ect Mult Living                 Review of Systems  Constitutional: Negative for fever and chills.  Respiratory: Negative for shortness of breath.   Cardiovascular: Negative for chest pain.  Gastrointestinal: Positive for abdominal pain. Negative for nausea, vomiting, diarrhea and constipation.  Genitourinary: Negative for dysuria.  All other systems reviewed and are negative.     Allergies  Other  Home Medications   Prior to Admission medications   Not on File   BP 139/81  Pulse 69  Temp(Src)  98 F (36.7 C) (Oral)  Resp 20  Ht 4\' 11"  (1.499 m)  Wt 185 lb (83.915 kg)  BMI 37.35 kg/m2  SpO2 100%  LMP 03/17/2014 Physical Exam  Nursing note and vitals reviewed. Constitutional: She is oriented to person, place, and time. She appears well-developed and well-nourished.  HENT:  Head: Normocephalic and atraumatic.  Eyes: Conjunctivae and EOM are normal. Pupils are equal, round, and reactive to light.  Neck: Normal range of motion. Neck supple.  Cardiovascular: Normal rate and regular rhythm.  Exam reveals no gallop and no friction rub.   No murmur heard. Pulmonary/Chest: Effort normal and breath sounds normal. No respiratory distress. She has no wheezes. She has no rales. She exhibits no tenderness.  Abdominal: Soft. Bowel sounds are normal. She exhibits no distension and no mass. There is no tenderness. There is no rebound and no guarding.  Abdomen is moderately tender to palpation diffusely,   Musculoskeletal: Normal range of motion. She exhibits no edema and no tenderness.  Neurological: She is alert and oriented to person, place, and time.  Skin: Skin is warm and dry.  Psychiatric: She has a normal mood and affect. Her behavior is normal. Judgment and thought content normal.    ED Course  Procedures (including critical care time) Labs Review Labs Reviewed  COMPREHENSIVE METABOLIC PANEL - Abnormal; Notable for the following:    Glucose, Bld 100 (*)    Total Bilirubin 0.2 (*)  GFR calc non Af Amer 76 (*)    GFR calc Af Amer 88 (*)    All other components within normal limits  URINALYSIS, ROUTINE W REFLEX MICROSCOPIC - Abnormal; Notable for the following:    APPearance HAZY (*)    All other components within normal limits  CBC WITH DIFFERENTIAL  PREGNANCY, URINE    Imaging Review No results found.   EKG Interpretation None      MDM   Final diagnoses:  None    Patient with abdominal pain that started today. She did not have leukocytosis. No fevers, no  vomiting, no diarrhea. Patient refuses rectal exam, which I explained to her as needed because of her bright red stool. Upon offering this exam, she sat up, and did not seem to be in any pain where previously she was in the fetal position. She adamantly refuses rectal exam, and states she'll go somewhere else. I told her that I'm not taking her out of the emergency department, but informed her that I cannot rule out a GI bleed without doing a rectal exam. She states that she is already "agitated," and that she would just go elsewhere. I discussed patient with Dr. Sabra Heck, who was able to convince the patient that we can still do other tests here and that she can defer the rectal exam and still have the remaining tests done today.  0100 Patient signed out to Dr. Sabra Heck, who will continue care.    Montine Circle, PA-C 04/07/14 [phone]

## 2014-04-06 NOTE — ED Notes (Signed)
Patient reports her last period was the middle of July.

## 2014-04-07 ENCOUNTER — Emergency Department (HOSPITAL_COMMUNITY): Payer: Medicaid Other

## 2014-04-07 ENCOUNTER — Encounter (HOSPITAL_COMMUNITY): Payer: Self-pay | Admitting: Radiology

## 2014-04-07 MED ORDER — KETOROLAC TROMETHAMINE 30 MG/ML IJ SOLN
30.0000 mg | Freq: Once | INTRAMUSCULAR | Status: AC
  Administered 2014-04-07: 30 mg via INTRAVENOUS
  Filled 2014-04-07: qty 1

## 2014-04-07 MED ORDER — IOHEXOL 300 MG/ML  SOLN
25.0000 mL | Freq: Once | INTRAMUSCULAR | Status: AC | PRN
  Administered 2014-04-07: 25 mL via ORAL

## 2014-04-07 MED ORDER — IOHEXOL 300 MG/ML  SOLN
80.0000 mL | Freq: Once | INTRAMUSCULAR | Status: AC | PRN
  Administered 2014-04-07: 80 mL via INTRAVENOUS

## 2014-04-07 MED ORDER — MORPHINE SULFATE 4 MG/ML IJ SOLN
4.0000 mg | Freq: Once | INTRAMUSCULAR | Status: AC
  Administered 2014-04-07: 4 mg via INTRAVENOUS
  Filled 2014-04-07: qty 1

## 2014-04-07 MED ORDER — NAPROXEN 500 MG PO TABS: 500.0000 mg | ORAL_TABLET | Freq: Two times a day (BID) | ORAL | Status: DC

## 2014-04-07 NOTE — ED Provider Notes (Signed)
And 28 year old female with history of multiple abdominal surgeries including a laparoscopic cholecystectomy, C-sections, D&C, laparoscopic surgery. She has both of her ovaries and her appendix. She describes onset of right lower quadrant pain radiating to her back as well as to her vagina and her rectum. On exam the patient has tenderness in her right lower quadrant with guarding. She is tearful, she has no left-sided abdominal tenderness, no surgical signs. Lab work and urinalysis are normal without hematuria or leukocytosis, CT scan pending to rule out either kidney stone or appendicitis of most concern however she has had multiple surgeries and has adhesions with possible postsurgical complications related to those.  Pt informed of results - Stable for d/c.  Medical screening examination/treatment/procedure(s) were conducted as a shared visit with non-physician practitioner(s) and myself.  I personally evaluated the patient during the encounter.  Clinical Impression: Ovarian cyst      Johnna Acosta, MD 04/07/14 [phone]

## 2014-04-07 NOTE — ED Provider Notes (Signed)
Medical screening examination/treatment/procedure(s) were conducted as a shared visit with non-physician practitioner(s) and myself.  I personally evaluated the patient during the encounter  Please see my separate respective documentation pertaining to this patient encounter   Johnna Acosta, MD 04/07/14 [phone]

## 2014-04-07 NOTE — ED Notes (Signed)
Patient finished drinking contrast. Called CT to inform.

## 2014-04-07 NOTE — ED Notes (Signed)
Discussed with the patient not to drive home.  Patient acknowledges, ride is present at the bedside.

## 2014-04-07 NOTE — Discharge Instructions (Signed)
Your CT scan shows cysts on your ovaries - no other acute findings - NO APPENDICITIS!  Please call your doctor for a followup appointment within 24-48 hours. When you talk to your doctor please let them know that you were seen in the emergency department and have them acquire all of your records so that they can discuss the findings with you and formulate a treatment plan to fully care for your new and ongoing problems. .   Emergency Department Resource Guide 1) Find a Doctor and Pay Out of Pocket Although you won't have to find out who is covered by your insurance plan, it is a good idea to ask around and get recommendations. You will then need to call the office and see if the doctor you have chosen will accept you as a new patient and what types of options they offer for patients who are self-pay. Some doctors offer discounts or will set up payment plans for their patients who do not have insurance, but you will need to ask so you aren't surprised when you get to your appointment.  2) Contact Your Local Health Department Not all health departments have doctors that can see patients for sick visits, but many do, so it is worth a call to see if yours does. If you don't know where your local health department is, you can check in your phone book. The CDC also has a tool to help you locate your state's health department, and many state websites also have listings of all of their local health departments.  3) Find a [facility] If your illness is not likely to be very severe or complicated, you may want to try a walk in clinic. These are popping up all over the country in pharmacies, drugstores, and shopping centers. They're usually staffed by nurse practitioners or physician assistants that have been trained to treat common illnesses and complaints. They're usually fairly quick and inexpensive. However, if you have serious medical issues or chronic medical problems, these are probably not your best  option.  No Primary Care Doctor: - Call Health Connect at  [phone] - they can help you locate a primary care doctor that  accepts your insurance, provides certain services, etc. - Physician Referral Service- [phone]  Chronic Pain Problems: Organization         Address  Phone   Notes  [facility]  [phone] Patients need to be referred by their primary care doctor.   Medication Assistance: Organization         Address  Phone   Notes  [facility] Medication [facility] [address] [place], [place] [address] [phone] --Must be a resident of [facility] -- Must have NO insurance coverage whatsoever (no Medicaid/ Medicare, etc.) -- The pt. MUST have a primary care doctor that directs their care regularly and follows them in the community   [facility]ist  [phone]   [employer]  [phone]    Agencies that provide inexpensive medical care: Organization         Address  Phone   Notes  [place]  [phone]   [facility]    [phone]   [facility] [place],  [phone]   [place] [address] [place] [phone]   Planned Parenthood    [phone]   [facility]    (  [phone]   [place] [address], [place] Phone:  [phone], Fax:  [phone] Hours of Operation:  9 am - 6 pm, M-F.  Also accepts Medicaid/Medicare and self-pay.  [facility] for [place], [address], [place] Phone: [phone], Fax: [phone]. Hours of Operation:  8:30 am - 5:30 pm, M-F.  Also accepts Medicaid and self-pay.  [facility] [address], [place] Phone: [phone]   [place], [place], [place] [phone], Ext. [phone] Mondays & Thursdays: 7-9 AM.  First 15  patients are seen on a first come, first serve basis.    [place] Providers:  Organization         Address  Phone   Notes  [facility] [address], [place] [phone] Also accepts self-pay patients.  [facility] [address], [place]  [phone]   [place], [address], [place] [phone]   [facility] [address] [place] [phone]   Lucianne Lei [address], [place]   [phone] Only accepts [place] Access [place] patients after they have their name applied to their card.   Self-Pay (no insurance) in [facility]:  Organization         Address  Phone   Notes  Sickle Cell Patients, [facility] [place] [phone]   [facility] [place] [phone]   [facility] [place]  [place], [place], [place] [phone]   [facility]/Dr. Osei-Bonsu  [address], [place] or [address], [place] [phone] Phone number for both [place] and [place] locations is the same.  Urgent Medical and [facility] [address] [place] [phone]   [facility] [address] [place] or [address] [phone]   [facility] [address] [place] [phone], phone; [phone], fax Sees patients 1st and 3rd Saturday of every month.  Must not qualify for public or private insurance (i.e. Medicaid, Medicare, [place] Health Choice, Veterans' Benefits)  Household income should be no more than 200% of the poverty level The clinic cannot treat you if you are pregnant or think you are pregnant  Sexually transmitted diseases are not treated at the clinic.    Dental  Care: Organization         Address  Phone  Notes  [facility] Department of [facility] [place] [phone] Accepts children up to age 46 who are enrolled in [place] or [place]; pregnant women with a Medicaid card; and children who have applied for Medicaid or [place] Health Choice, but were declined, whose parents can pay a reduced fee at time of service.  [facility] Department of [facility]  [address], [place] [phone] Accepts children up to age 35 who are enrolled in [place] or [place]; pregnant women with a Medicaid card; and children who have applied for Medicaid or [place] Health Choice, but were declined, whose parents can pay a reduced fee at time of service.  [place]  Access PROGRAM  [place] [phone] Patients are seen by appointment only. Walk-ins are not accepted. [place] will see patients 53 years of age and older. Monday - Tuesday (8am-5pm) Most Wednesdays (8:30-5pm) $30 per visit, cash only  [facility] Adult Dental Access PROGRAM  [address], [facility] [phone] Patients are seen by appointment only. Walk-ins are not accepted. [place] will see patients 54 years of age and older. One Wednesday Evening (Monthly: Volunteer Based).  $30 per visit, cash only  [place]  [phone] for adults; Children under age 34, call Graduate Pediatric Dentistry at [phone]. Children aged 25-14, please call [phone] to request a pediatric application.  Dental services are provided in all areas of dental care including fillings, crowns and bridges, complete and partial dentures, implants, gum treatment, root canals, and extractions. Preventive care is also provided. Treatment is provided to both adults and children. Patients are selected via a lottery and there is often a waiting list.   [facility] [address] [place]  [phone] [email or web address]   [facility] [address] [place], [place] [phone], Ext. [phone] Second and Fourth Thursday of each month, opens at 6:30 AM; Clinic ends at 9 AM.  Patients are seen on a first-come first-served basis, and a limited number are seen during each clinic.   [facility]  [address] Hillard Danker [place], [place] [phone]   Eligibility Requirements You must have lived in [place], [place], or [place] counties for at least the last three months.   You cannot be eligible for state or federal sponsored [employer], including [employer], [place], or [employer].   You generally cannot be eligible for healthcare insurance through your employer.    How to apply: Eligibility screenings are held every Tuesday and Wednesday afternoon from 1:00 pm until 4:00 pm. You do not need an appointment for the interview!  [facility] [address], [place], [place]   [place]  [place] Department  [place]  [phone]    Behavioral Health Resources in the Community: Intensive Outpatient Programs Organization         Address  Phone  Notes  [facility] [place]. [address], [place], [place] [phone]   [facility] Outpatient [address] [place], [place]   ADS: Alcohol & Drug Svcs [address] [place], [place]   [place] [address],  [place], [place] or [phone]   Substance Abuse Resources Organization         Address  Phone  Notes  Alcohol and Drug Services  [phone]   [place]  [phone]   [facility] [place]   Chinita Pester  [phone]   Residential & Outpatient Substance Abuse Program  [phone]    Psychological Services Organization         Address  Phone  Notes  [facility] [place]  [place]  [phone]   [place] [address] [place] or [phone]    Mobile Crisis Teams Organization         Address  Phone  Notes  [facility], Mobile Crisis Care Unit  [phone]   [facility]  [address] [place],  [place] [address] [place] [phone]    Self-Help/Support Groups Organization         Address  Phone             Notes  Mental Health Assoc. of [place] - variety of support groups  [place] Call for more information  Narcotics Anonymous (NA), [facility] [address], [employer] [place]  2 meetings at this location   Special educational needs teacher         Address  Phone  Notes  [facility] Residential Treatment [place],    [place]  [phone]   [facility]  [address], [place] [address], [place], [place]   [place], [place] [phone] Admissions: 8am-3pm M-F  Incentives Substance [place] [address],    [place], [place] [phone]   [facility] [address] [place], [place], [place]   [facility] [address]  [place], [place]   [facility] Programs - Intensive Outpatient [address] Kristeen [address], [place], [place]   [facility] ([place].,  [place], [place] [phone] or [phone]   [facility] Services (RTS) [address] [place], [place] Accepts Medicaid  [facility] [place] [address],  [place] [phone] Substance Abuse/Addiction Treatment   [facility] Organization         Address  Phone  Notes  [facility]  [phone]   Domenic Schwab, PhD [address] Arlis Porta [place], [place]   [phone] or [phone]   [place], [place] [phone]   [facility] [address] [place], [place] [phone] Insurance/Medicaid/sponsorship through [facility] and Families [address], [address] [place]                                    [place], [place] [phone] [place] [address], [place] [phone]    Dr. Adele Schilder  [phone]   [facility] [place] Dept. 1) [address] [place] 2) [facility] 3)  [place] [address], [place] [phone]  [phone]   [place] [phone] or [phone] (After Hours)

## 2014-04-14 ENCOUNTER — Emergency Department (HOSPITAL_COMMUNITY): Payer: Medicaid Other

## 2014-04-14 ENCOUNTER — Emergency Department (HOSPITAL_COMMUNITY)
Admission: EM | Admit: 2014-04-14 | Discharge: 2014-04-14 | Disposition: A | Discharge status: Home / Self Care | Payer: Medicaid Other | Attending: Emergency Medicine | Admitting: Emergency Medicine

## 2014-04-14 ENCOUNTER — Encounter (HOSPITAL_COMMUNITY): Payer: Self-pay | Admitting: Emergency Medicine

## 2014-04-14 DIAGNOSIS — F17200 Nicotine dependence, unspecified, uncomplicated: Secondary | ICD-10-CM | POA: Diagnosis not present

## 2014-04-14 DIAGNOSIS — Z8659 Personal history of other mental and behavioral disorders: Secondary | ICD-10-CM | POA: Diagnosis not present

## 2014-04-14 DIAGNOSIS — N80109 Endometriosis of ovary, unspecified side, unspecified depth: Secondary | ICD-10-CM | POA: Insufficient documentation

## 2014-04-14 DIAGNOSIS — J45909 Unspecified asthma, uncomplicated: Secondary | ICD-10-CM | POA: Insufficient documentation

## 2014-04-14 DIAGNOSIS — Z791 Long term (current) use of non-steroidal anti-inflammatories (NSAID): Secondary | ICD-10-CM | POA: Diagnosis not present

## 2014-04-14 DIAGNOSIS — N949 Unspecified condition associated with female genital organs and menstrual cycle: Secondary | ICD-10-CM | POA: Insufficient documentation

## 2014-04-14 DIAGNOSIS — N801 Endometriosis of ovary: Secondary | ICD-10-CM | POA: Diagnosis not present

## 2014-04-14 DIAGNOSIS — N898 Other specified noninflammatory disorders of vagina: Secondary | ICD-10-CM | POA: Insufficient documentation

## 2014-04-14 DIAGNOSIS — Z9089 Acquired absence of other organs: Secondary | ICD-10-CM | POA: Diagnosis not present

## 2014-04-14 DIAGNOSIS — I10 Essential (primary) hypertension: Secondary | ICD-10-CM | POA: Insufficient documentation

## 2014-04-14 DIAGNOSIS — R109 Unspecified abdominal pain: Secondary | ICD-10-CM | POA: Diagnosis present

## 2014-04-14 DIAGNOSIS — Z3202 Encounter for pregnancy test, result negative: Secondary | ICD-10-CM | POA: Insufficient documentation

## 2014-04-14 DIAGNOSIS — N80129 Deep endometriosis of ovary, unspecified ovary: Secondary | ICD-10-CM

## 2014-04-14 LAB — CBC WITH DIFFERENTIAL/PLATELET
BASOS ABS: 0 10*3/uL (ref 0.0–0.1)
Basophils Relative: 0 % (ref 0–1)
EOS ABS: 0.1 10*3/uL (ref 0.0–0.7)
Eosinophils Relative: 2 % (ref 0–5)
HCT: 39.2 % (ref 36.0–46.0)
Hemoglobin: 13.2 g/dL (ref 12.0–15.0)
LYMPHS ABS: 2.2 10*3/uL (ref 0.7–4.0)
LYMPHS PCT: 37 % (ref 12–46)
MCH: 28.1 pg (ref 26.0–34.0)
MCHC: 33.7 g/dL (ref 30.0–36.0)
MCV: 83.4 fL (ref 78.0–100.0)
Monocytes Absolute: 0.3 10*3/uL (ref 0.1–1.0)
Monocytes Relative: 5 % (ref 3–12)
NEUTROS PCT: 56 % (ref 43–77)
Neutro Abs: 3.5 10*3/uL (ref 1.7–7.7)
PLATELETS: 250 10*3/uL (ref 150–400)
RBC: 4.7 MIL/uL (ref 3.87–5.11)
RDW: 14.5 % (ref 11.5–15.5)
WBC: 6.1 10*3/uL (ref 4.0–10.5)

## 2014-04-14 LAB — URINALYSIS, ROUTINE W REFLEX MICROSCOPIC
Bilirubin Urine: NEGATIVE
GLUCOSE, UA: NEGATIVE mg/dL
Ketones, ur: NEGATIVE mg/dL
Nitrite: NEGATIVE
Protein, ur: NEGATIVE mg/dL
Specific Gravity, Urine: 1.018 (ref 1.005–1.030)
Urobilinogen, UA: 0.2 mg/dL (ref 0.0–1.0)
pH: 6.5 (ref 5.0–8.0)

## 2014-04-14 LAB — COMPREHENSIVE METABOLIC PANEL
ALT: 13 U/L (ref 0–35)
AST: 17 U/L (ref 0–37)
Albumin: 3.7 g/dL (ref 3.5–5.2)
Alkaline Phosphatase: 81 U/L (ref 39–117)
Anion gap: 13 (ref 5–15)
BUN: 11 mg/dL (ref 6–23)
CO2: 23 meq/L (ref 19–32)
CREATININE: 0.93 mg/dL (ref 0.50–1.10)
Calcium: 9.2 mg/dL (ref 8.4–10.5)
Chloride: 102 mEq/L (ref 96–112)
GFR, EST NON AFRICAN AMERICAN: 83 mL/min — AB (ref >90)
GLUCOSE: 83 mg/dL (ref 70–99)
Potassium: 3.9 mEq/L (ref 3.7–5.3)
Sodium: 138 mEq/L (ref 137–147)
Total Bilirubin: <0.2 mg/dL — ABNORMAL LOW (ref 0.3–1.2)
Total Protein: 7.2 g/dL (ref 6.0–8.3)

## 2014-04-14 LAB — WET PREP, GENITAL
CLUE CELLS WET PREP: NEGATIVE — AB
Trich, Wet Prep: NEGATIVE — AB
YEAST WET PREP: NEGATIVE — AB

## 2014-04-14 LAB — POC URINE PREG, ED: PREG TEST UR: NEGATIVE

## 2014-04-14 LAB — URINE MICROSCOPIC-ADD ON

## 2014-04-14 MED ORDER — NAPROXEN 500 MG PO TABS: 500.0000 mg | ORAL_TABLET | Freq: Two times a day (BID) | ORAL | Status: DC

## 2014-04-14 MED ORDER — FLUCONAZOLE 100 MG PO TABS
150.0000 mg | ORAL_TABLET | Freq: Once | ORAL | Status: AC
  Administered 2014-04-14: 150 mg via ORAL
  Filled 2014-04-14: qty 2

## 2014-04-14 MED ORDER — OXYCODONE-ACETAMINOPHEN 5-325 MG PO TABS
1.0000 | ORAL_TABLET | Freq: Once | ORAL | Status: AC
  Administered 2014-04-14: 1 via ORAL
  Filled 2014-04-14: qty 1

## 2014-04-14 MED ORDER — HYDROCODONE-ACETAMINOPHEN 5-325 MG PO TABS: 2.0000 | ORAL_TABLET | ORAL | Status: DC | PRN

## 2014-04-14 NOTE — ED Provider Notes (Signed)
CSN: [id number]     Arrival date & time 04/14/14  1119 History   First MD Initiated Contact with Patient 04/14/14 1140     Chief Complaint  Patient presents with  . Abdominal Pain     (Consider location/radiation/quality/duration/timing/severity/associated sxs/prior Treatment) Patient is a 28 y.o. female presenting with female genitourinary complaint. The history is provided by the patient.  Female GU Problem This is a recurrent problem. The current episode started yesterday. The problem occurs constantly. The problem has been unchanged. Associated symptoms include abdominal pain. Pertinent negatives include no arthralgias, chest pain, chills, congestion, fever, headaches, myalgias, nausea or vomiting. Nothing aggravates the symptoms. She has tried nothing for the symptoms. The treatment provided no relief.    28 yo F with a chief complaint of right lower pelvic pain. Patient says this been going on for about a week. Patient was seen here yesterday had a CT scan that was negative for appendicitis but showed a left-sided ovarian cyst. Patient was discharged home with naproxen. Patient states that the pain has continued she had some spotting associated with it as well. Patient denies any chance of being pregnant. Patient denies any prior sexual transmitted diseases. Patient currently without OB/GYN. Patient denies any fevers or chills. Patient denies any nausea or vomiting or diarrhea. Patient has had itching from her vaginal orifice. Patient states that is consistent with a yeast infection in the past. The pain had a sharp abrupt onset this morning. Patient has taken 4 Tylenol without relief.  Past Medical History  Diagnosis Date  . Hypertension   . Asthma   . Deliberate self-cutting   . Epistaxis    Past Surgical History  Procedure Laterality Date  . Cholecystectomy     History reviewed. No pertinent family history. History  Substance Use Topics  . Smoking status: Current Every Day  Smoker  . Smokeless tobacco: Not on file  . Alcohol Use: Yes     Comment: Social drinker    OB History   Grav Para Term Preterm Abortions TAB SAB Ect Mult Living                 Review of Systems  Constitutional: Negative for fever and chills.  HENT: Negative for congestion and rhinorrhea.   Eyes: Negative for redness and visual disturbance.  Respiratory: Negative for shortness of breath and wheezing.   Cardiovascular: Negative for chest pain and palpitations.  Gastrointestinal: Positive for abdominal pain. Negative for nausea and vomiting.  Genitourinary: Positive for vaginal bleeding, vaginal discharge and vaginal pain. Negative for dysuria, urgency, frequency and flank pain.  Musculoskeletal: Negative for arthralgias and myalgias.  Skin: Negative for pallor and wound.  Neurological: Negative for dizziness and headaches.      Allergies  Other  Home Medications   Prior to Admission medications   Medication Sig Start Date End Date Taking? Authorizing Provider  naproxen (NAPROSYN) 500 MG tablet Take 1 tablet (500 mg total) by mouth 2 (two) times daily with a meal. 04/07/14  Yes Johnna Acosta, MD  HYDROcodone-acetaminophen (NORCO/VICODIN) 5-325 MG per tablet Take 2 tablets by mouth every 4 (four) hours as needed for moderate pain or severe pain. 04/14/14   Deno Etienne, MD   BP 153/104  Pulse 71  Temp(Src) 98.5 F (36.9 C) (Oral)  Resp 18  SpO2 100%  LMP 03/17/2014 Physical Exam  Constitutional: She is oriented to person, place, and time. She appears well-developed and well-nourished. No distress.  HENT:  Head: Normocephalic and atraumatic.  Eyes: EOM are normal. Pupils are equal, round, and reactive to light.  Neck: Normal range of motion. Neck supple.  Cardiovascular: Normal rate and regular rhythm.  Exam reveals no gallop and no friction rub.   No murmur heard. Pulmonary/Chest: Effort normal. She has no wheezes. She has no rales.  Abdominal: Soft. She exhibits no  distension. There is no tenderness.  Genitourinary: Cervix exhibits discharge. Cervix exhibits no motion tenderness. Right adnexum displays tenderness. Right adnexum displays no mass and no fullness. Left adnexum displays no mass, no tenderness and no fullness.  Difficult to access CMT as patient with severe pain on insertion.  Felt that pain was most in the R adnexa.   Musculoskeletal: She exhibits no edema and no tenderness.  Neurological: She is alert and oriented to person, place, and time.  Skin: Skin is warm and dry. She is not diaphoretic.  Psychiatric: She has a normal mood and affect. Her behavior is normal.    ED Course  Procedures (including critical care time) Labs Review Labs Reviewed  WET PREP, GENITAL - Abnormal; Notable for the following:    Yeast Wet Prep HPF POC NEGATIVE (*)    Trich, Wet Prep NEGATIVE (*)    Clue Cells Wet Prep HPF POC NEGATIVE (*)    WBC, Wet Prep HPF POC FEW (*)    All other components within normal limits  COMPREHENSIVE METABOLIC PANEL - Abnormal; Notable for the following:    Total Bilirubin <0.2 (*)    GFR calc non Af Amer 83 (*)    All other components within normal limits  URINALYSIS, ROUTINE W REFLEX MICROSCOPIC - Abnormal; Notable for the following:    Hgb urine dipstick TRACE (*)    Leukocytes, UA SMALL (*)    All other components within normal limits  URINE MICROSCOPIC-ADD ON - Abnormal; Notable for the following:    Squamous Epithelial / LPF FEW (*)    All other components within normal limits  GC/CHLAMYDIA PROBE AMP  CBC WITH DIFFERENTIAL  POC URINE PREG, ED    Imaging Review US Transvaginal Non-ob  04/14/2014   CLINICAL DATA:  Abdominal pain.  EXAM: TRANSABDOMINAL AND TRANSVAGINAL ULTRASOUND OF PELVIS  DOPPLER ULTRASOUND OF OVARIES  TECHNIQUE: Both transabdominal and transvaginal ultrasound examinations of the pelvis were performed. Transabdominal technique was performed for global imaging of the pelvis including uterus, ovaries,  adnexal regions, and pelvic cul-de-sac.  It was necessary to proceed with endovaginal exam following the transabdominal exam to visualize the ovaries. Color and duplex Doppler ultrasound was utilized to evaluate blood flow to the ovaries.  COMPARISON:  CT 04/07/2014  FINDINGS: Uterus  Measurements: 7.5 x 4.1 x 5.1 cm. No fibroids or other mass visualized.  Endometrium  Thickness: 9 mm.  No focal abnormality visualized.  Right ovary  Measurements: 4.0 x 3.8 x 2.8 cm. Hypoechoic area within the right ovary measures 2.9 x 2.8 x 2.0 cm  Left ovary  Measurements: 3.5 x 3.1 x 3.7 cm. 2.4 cm cyst with internal echoes.  Pulsed Doppler evaluation of both ovaries demonstrates normal low-resistance arterial and venous waveforms.  Other findings  No free fluid.  IMPRESSION: 2.9 cm hypoechoic lesion within the right ovary, favor endometrioma. This could be followed with repeat ultrasound in 6-12 weeks.  2.4 cm hemorrhagic cyst within the left ovary. Again, this could be followed with repeat ultrasound in 6-12 weeks.  No evidence of ovarian torsion.   Electronically Signed   By: Rolm Baptise M.D.   On:  04/14/2014 14:12   US Pelvis Complete  04/14/2014   CLINICAL DATA:  Abdominal pain.  EXAM: TRANSABDOMINAL AND TRANSVAGINAL ULTRASOUND OF PELVIS  DOPPLER ULTRASOUND OF OVARIES  TECHNIQUE: Both transabdominal and transvaginal ultrasound examinations of the pelvis were performed. Transabdominal technique was performed for global imaging of the pelvis including uterus, ovaries, adnexal regions, and pelvic cul-de-sac.  It was necessary to proceed with endovaginal exam following the transabdominal exam to visualize the ovaries. Color and duplex Doppler ultrasound was utilized to evaluate blood flow to the ovaries.  COMPARISON:  CT 04/07/2014  FINDINGS: Uterus  Measurements: 7.5 x 4.1 x 5.1 cm. No fibroids or other mass visualized.  Endometrium  Thickness: 9 mm.  No focal abnormality visualized.  Right ovary  Measurements: 4.0 x 3.8 x  2.8 cm. Hypoechoic area within the right ovary measures 2.9 x 2.8 x 2.0 cm  Left ovary  Measurements: 3.5 x 3.1 x 3.7 cm. 2.4 cm cyst with internal echoes.  Pulsed Doppler evaluation of both ovaries demonstrates normal low-resistance arterial and venous waveforms.  Other findings  No free fluid.  IMPRESSION: 2.9 cm hypoechoic lesion within the right ovary, favor endometrioma. This could be followed with repeat ultrasound in 6-12 weeks.  2.4 cm hemorrhagic cyst within the left ovary. Again, this could be followed with repeat ultrasound in 6-12 weeks.  No evidence of ovarian torsion.   Electronically Signed   By: Rolm Baptise M.D.   On: 04/14/2014 14:12   [place] Art/ven Flow Abd Pelv Doppler  04/14/2014   CLINICAL DATA:  Abdominal pain.  EXAM: TRANSABDOMINAL AND TRANSVAGINAL ULTRASOUND OF PELVIS  DOPPLER ULTRASOUND OF OVARIES  TECHNIQUE: Both transabdominal and transvaginal ultrasound examinations of the pelvis were performed. Transabdominal technique was performed for global imaging of the pelvis including uterus, ovaries, adnexal regions, and pelvic cul-de-sac.  It was necessary to proceed with endovaginal exam following the transabdominal exam to visualize the ovaries. Color and duplex Doppler ultrasound was utilized to evaluate blood flow to the ovaries.  COMPARISON:  CT 04/07/2014  FINDINGS: Uterus  Measurements: 7.5 x 4.1 x 5.1 cm. No fibroids or other mass visualized.  Endometrium  Thickness: 9 mm.  No focal abnormality visualized.  Right ovary  Measurements: 4.0 x 3.8 x 2.8 cm. Hypoechoic area within the right ovary measures 2.9 x 2.8 x 2.0 cm  Left ovary  Measurements: 3.5 x 3.1 x 3.7 cm. 2.4 cm cyst with internal echoes.  Pulsed Doppler evaluation of both ovaries demonstrates normal low-resistance arterial and venous waveforms.  Other findings  No free fluid.  IMPRESSION: 2.9 cm hypoechoic lesion within the right ovary, favor endometrioma. This could be followed with repeat ultrasound in 6-12 weeks.  2.4 cm  hemorrhagic cyst within the left ovary. Again, this could be followed with repeat ultrasound in 6-12 weeks.  No evidence of ovarian torsion.   Electronically Signed   By: Rolm Baptise M.D.   On: 04/14/2014 14:12     EKG Interpretation None      MDM   Final diagnoses:  Endometrioma of ovary    28 yo F who presents with right pelvic pain. Concern for possible torsion. We'll obtain a pelvic ultrasound.  No signs of torsion on [place], found to have endometrioma.  Spoke with [facility] Roselie Awkward from [facility], recommend outpatient follow up.  Patient will continue NSAIDs at home, short course of norco.  Patient given diflucan for yeast infection.    2:38 PM:  I have discussed the diagnosis/risks/treatment options with the patient  and believe the pt to be eligible for discharge home to follow-up with OB. We also discussed returning to the ED immediately if new or worsening sx occur. We discussed the sx which are most concerning (e.g., sudden worsening pain, fever) that necessitate immediate return. Medications administered to the patient during their visit and any new prescriptions provided to the patient are listed below.  Medications given during this visit Medications  fluconazole (DIFLUCAN) tablet 150 mg (not administered)  oxyCODONE-acetaminophen (PERCOCET/ROXICET) 5-325 MG per tablet 1 tablet (1 tablet Oral Given 04/14/14 1423)    New Prescriptions   HYDROCODONE-ACETAMINOPHEN (NORCO/VICODIN) 5-325 MG PER TABLET    Take 2 tablets by mouth every 4 (four) hours as needed for moderate pain or severe pain.     Deno Etienne, MD 04/14/14 1438

## 2014-04-14 NOTE — ED Notes (Signed)
Pt reports lower abd pain, pt reports being seen recently for the pain and was given prescriptions  But still having the pain and now vaginal discharge. No acute distress noted at triage.

## 2014-04-14 NOTE — Discharge Instructions (Signed)
Take 4 over the counter ibuprofen tablets 3 times a day or 2 over-the-counter naproxen tablets twice a day for pain.  Abdominal (belly) pain can be caused by many things. Your caregiver performed an examination and possibly ordered blood/urine tests and imaging (CT scan, x-rays, ultrasound). Many cases can be observed and treated at home after initial evaluation in the emergency department. Even though you are being discharged home, abdominal pain can be unpredictable. Therefore, you need a repeated exam if your pain does not resolve, returns, or worsens. Most patients with abdominal pain don't have to be admitted to the hospital or have surgery, but serious problems like appendicitis and gallbladder attacks can start out as nonspecific pain. Many abdominal conditions cannot be diagnosed in one visit, so follow-up evaluations are very important. SEEK IMMEDIATE MEDICAL ATTENTION IF: The pain does not go away or becomes severe.  A temperature above 101 develops.  Repeated vomiting occurs (multiple episodes).  The pain becomes localized to portions of the abdomen. The right side could possibly be appendicitis. In an adult, the left lower portion of the abdomen could be colitis or diverticulitis.  Blood is being passed in stools or vomit (bright red or black tarry stools).  Return also if you develop chest pain, difficulty breathing, dizziness or fainting, or become confused, poorly responsive, or inconsolable (young children).  Ovarian Cyst An ovarian cyst is a fluid-filled sac that forms on an ovary. The ovaries are small organs that produce eggs in women. Various types of cysts can form on the ovaries. Most are not cancerous. Many do not cause problems, and they often go away on their own. Some may cause symptoms and require treatment. Common types of ovarian cysts include:  Functional cysts--These cysts may occur every month during the menstrual cycle. This is normal. The cysts usually go away with the  next menstrual cycle if the woman does not get pregnant. Usually, there are no symptoms with a functional cyst.  Endometrioma cysts--These cysts form from the tissue that lines the uterus. They are also called "chocolate cysts" because they become filled with blood that turns brown. This type of cyst can cause pain in the lower abdomen during intercourse and with your menstrual period.  Cystadenoma cysts--This type develops from the cells on the outside of the ovary. These cysts can get very big and cause lower abdomen pain and pain with intercourse. This type of cyst can twist on itself, cut off its blood supply, and cause severe pain. It can also easily rupture and cause a lot of pain.  Dermoid cysts--This type of cyst is sometimes found in both ovaries. These cysts may contain different kinds of body tissue, such as skin, teeth, hair, or cartilage. They usually do not cause symptoms unless they get very big.  Theca lutein cysts--These cysts occur when too much of a certain hormone (human chorionic gonadotropin) is produced and overstimulates the ovaries to produce an egg. This is most common after procedures used to assist with the conception of a baby (in vitro fertilization). CAUSES   Fertility drugs can cause a condition in which multiple large cysts are formed on the ovaries. This is called ovarian hyperstimulation syndrome.  A condition called polycystic ovary syndrome can cause hormonal imbalances that can lead to nonfunctional ovarian cysts. SIGNS AND SYMPTOMS  Many ovarian cysts do not cause symptoms. If symptoms are present, they may include:  Pelvic pain or pressure.  Pain in the lower abdomen.  Pain during sexual intercourse.  Increasing  girth (swelling) of the abdomen.  Abnormal menstrual periods.  Increasing pain with menstrual periods.  Stopping having menstrual periods without being pregnant. DIAGNOSIS  These cysts are commonly found during a routine or annual pelvic  exam. Tests may be ordered to find out more about the cyst. These tests may include:  Ultrasound.  X-ray of the pelvis.  CT scan.  MRI.  Blood tests. TREATMENT  Many ovarian cysts go away on their own without treatment. Your health care provider may want to check your cyst regularly for 2-3 months to see if it changes. For women in menopause, it is particularly important to monitor a cyst closely because of the higher rate of ovarian cancer in menopausal women. When treatment is needed, it may include any of the following:  A procedure to drain the cyst (aspiration). This may be done using a long needle and ultrasound. It can also be done through a laparoscopic procedure. This involves using a thin, lighted tube with a tiny camera on the end (laparoscope) inserted through a small incision.  Surgery to remove the whole cyst. This may be done using laparoscopic surgery or an open surgery involving a larger incision in the lower abdomen.  Hormone treatment or birth control pills. These methods are sometimes used to help dissolve a cyst. HOME CARE INSTRUCTIONS   Only take over-the-counter or prescription medicines as directed by your health care provider.  Follow up with your health care provider as directed.  Get regular pelvic exams and Pap tests. SEEK MEDICAL CARE IF:   Your periods are late, irregular, or painful, or they stop.  Your pelvic pain or abdominal pain does not go away.  Your abdomen becomes larger or swollen.  You have pressure on your bladder or trouble emptying your bladder completely.  You have pain during sexual intercourse.  You have feelings of fullness, pressure, or discomfort in your stomach.  You lose weight for no apparent reason.  You feel generally ill.  You become constipated.  You lose your appetite.  You develop acne.  You have an increase in body and facial hair.  You are gaining weight, without changing your exercise and eating  habits.  You think you are pregnant. SEEK IMMEDIATE MEDICAL CARE IF:   You have increasing abdominal pain.  You feel sick to your stomach (nauseous), and you throw up (vomit).  You develop a fever that comes on suddenly.  You have abdominal pain during a bowel movement.  Your menstrual periods become heavier than usual. MAKE SURE YOU:  Understand these instructions.  Will watch your condition.  Will get help right away if you are not doing well or get worse. Document Released: 08/20/2005 Document Revised: 08/25/2013 Document Reviewed: 04/27/2013 [facility] Patient Information 2015 [place], [place]. This information is not intended to replace advice given to you by your health care provider. Make sure you discuss any questions you have with your health care provider.

## 2014-04-15 LAB — GC/CHLAMYDIA PROBE AMP
CT Probe RNA: NEGATIVE
GC Probe RNA: NEGATIVE

## 2014-04-16 NOTE — ED Provider Notes (Signed)
I saw and evaluated the patient, reviewed the resident's note and I agree with the findings and plan.   EKG Interpretation None       U/S without torsion, consult GYN for endometrioma  Ephraim Hamburger, MD 04/16/14 [id number]

## 2014-05-17 ENCOUNTER — Inpatient Hospital Stay (HOSPITAL_COMMUNITY)
Admission: AD | Admit: 2014-05-17 | Discharge: 2014-05-17 | Disposition: A | Discharge status: Home / Self Care | Payer: Medicaid Other | Source: Ambulatory Visit | Attending: Family Medicine | Admitting: Family Medicine

## 2014-05-17 ENCOUNTER — Encounter (HOSPITAL_COMMUNITY): Payer: Self-pay | Admitting: *Deleted

## 2014-05-17 DIAGNOSIS — N925 Other specified irregular menstruation: Secondary | ICD-10-CM

## 2014-05-17 DIAGNOSIS — I10 Essential (primary) hypertension: Secondary | ICD-10-CM | POA: Diagnosis not present

## 2014-05-17 DIAGNOSIS — N920 Excessive and frequent menstruation with regular cycle: Secondary | ICD-10-CM | POA: Diagnosis present

## 2014-05-17 DIAGNOSIS — N83209 Unspecified ovarian cyst, unspecified side: Secondary | ICD-10-CM | POA: Insufficient documentation

## 2014-05-17 DIAGNOSIS — F17200 Nicotine dependence, unspecified, uncomplicated: Secondary | ICD-10-CM | POA: Insufficient documentation

## 2014-05-17 DIAGNOSIS — N938 Other specified abnormal uterine and vaginal bleeding: Secondary | ICD-10-CM | POA: Insufficient documentation

## 2014-05-17 DIAGNOSIS — N949 Unspecified condition associated with female genital organs and menstrual cycle: Secondary | ICD-10-CM | POA: Diagnosis not present

## 2014-05-17 MED ORDER — IBUPROFEN 600 MG PO TABS: 600.0000 mg | ORAL_TABLET | Freq: Four times a day (QID) | ORAL | Status: DC | PRN

## 2014-05-17 NOTE — MAU Provider Note (Signed)
History     CSN: [id number]  Arrival date and time: 05/17/14 1333   First Provider Initiated Contact with Patient 05/17/14 1424      Chief Complaint  Patient presents with  . Vaginal Bleeding   HPI This is a 28 y.o. female who presents with c/o heavy period bleeding. Felt like it was pouring out tonight. Got worried. States was recently diagnosed with bilateral ovarian cysts (endometriomas).  Has not had this happen before. Usually periods are lighter.   RN Note:   Sat night, started bleeding. Early for period heavier than usual. Today was at work and could feel it pouring out. Was dx with bilateral ovarian cysts 3 wks ago       OB History   Grav Para Term Preterm Abortions TAB SAB Ect Mult Living                  Past Medical History  Diagnosis Date  . Hypertension   . Asthma   . Deliberate self-cutting   . Epistaxis     Past Surgical History  Procedure Laterality Date  . Cholecystectomy      No family history on file.  History  Substance Use Topics  . Smoking status: Current Every Day Smoker  . Smokeless tobacco: Not on file  . Alcohol Use: Yes     Comment: Social drinker     Allergies:  Allergies  Allergen Reactions  . Other     clorox makes her "itch really bad". Allergy to "fresh roses" also.     No prescriptions prior to admission    Review of Systems  Constitutional: Negative for fever, chills and malaise/fatigue.  Gastrointestinal: Negative for nausea, vomiting and abdominal pain.  Genitourinary:       Vaginal bleeding   Neurological: Negative for weakness.   Physical Exam   Blood pressure 157/101, pulse 77, temperature 98.6 F (37 C), temperature source Oral, resp. rate 18, height 4' 10.5" (1.486 m), weight 187 lb (84.823 kg), last menstrual period 05/15/2014.  Physical Exam  Constitutional: She is oriented to person, place, and time. She appears well-developed and well-nourished. No distress.  HENT:  Head: Normocephalic.   Cardiovascular: Normal rate.   Respiratory: Effort normal.  GI: Soft. She exhibits no distension. There is no tenderness. There is no rebound and no guarding.  Genitourinary: Uterus normal. Vaginal discharge (moderate blood in vault, not flowing) found.  Uterus small and nontender Adnexae nontender   Musculoskeletal: Normal range of motion.  Neurological: She is alert and oriented to person, place, and time.  Skin: Skin is warm and dry.  Psychiatric: She has a normal mood and affect.    MAU Course  Procedures  MDM Results for CHANESE, HARTSOUGH (MRN [id number]) as of 05/18/2014 16:30  Ref. Range 04/14/2014 11:40  Hemoglobin Latest Range: 12.0-15.0 g/dL 13.2  HCT Latest Range: 36.0-46.0 % 39.2  Results for ARELYS, GLASSCO (MRN [id number]) as of 05/18/2014 16:30  Ref. Range 04/14/2014 12:36  GC Probe RNA Latest Range: NEGATIVE  NEGATIVE  CT Probe RNA Latest Range: NEGATIVE  NEGATIVE  Yeast Wet Prep HPF POC Latest Range: NONE SEEN  NEGATIVE (A)  Trich, Wet Prep Latest Range: NONE SEEN  NEGATIVE (A)  Clue Cells Wet Prep HPF POC Latest Range: NONE SEEN  NEGATIVE (A)  WBC, Wet Prep HPF POC Latest Range: NONE SEEN  FEW (A)    Assessment and Plan  A:  Dysfunctional uterine bleeding, one episode, probable anovulatory cycle  Now bleeding has slowed      Known bilateral ovarian cysts.  P:  Discussed menstrual cycle and occasional anovulatory cycles in detail       Advised watch and wait for now       If persistent, could offer hormonal therapy       Followup with Health Department          [facility] 05/17/2014, 2:56 PM

## 2014-05-17 NOTE — MAU Note (Addendum)
Sat night, started bleeding. Early for period heavier than usual.  Today was at work and could feel it pouring out. Was dx with bilateral ovarian cysts 3 wks ago

## 2014-05-17 NOTE — Discharge Instructions (Signed)
Abnormal Uterine Bleeding Abnormal uterine bleeding can affect women at various stages in life, including teenagers, women in their reproductive years, pregnant women, and women who have reached menopause. Several kinds of uterine bleeding are considered abnormal, including:  Bleeding or spotting between periods.   Bleeding after sexual intercourse.   Bleeding that is heavier or more than normal.   Periods that last longer than usual.  Bleeding after menopause.  Many cases of abnormal uterine bleeding are minor and simple to treat, while others are more serious. Any type of abnormal bleeding should be evaluated by your health care provider. Treatment will depend on the cause of the bleeding. HOME CARE INSTRUCTIONS Monitor your condition for any changes. The following actions may help to alleviate any discomfort you are experiencing:  Avoid the use of tampons and douches as directed by your health care provider.  Change your pads frequently. You should get regular pelvic exams and Pap tests. Keep all follow-up appointments for diagnostic tests as directed by your health care provider.  SEEK MEDICAL CARE IF:   Your bleeding lasts more than 1 week.   You feel dizzy at times.  SEEK IMMEDIATE MEDICAL CARE IF:   You pass out.   You are changing pads every 15 to 30 minutes.   You have abdominal pain.  You have a fever.   You become sweaty or weak.   You are passing large blood clots from the vagina.   You start to feel nauseous and vomit. MAKE SURE YOU:   Understand these instructions.  Will watch your condition.  Will get help right away if you are not doing well or get worse. Document Released: 08/20/2005 Document Revised: 08/25/2013 Document Reviewed: 03/19/2013 [employer] Patient Information 2015 [employer]. This information is not intended to replace advice given to you by your health care provider. Make sure you discuss any questions you have with your  health care provider.  

## 2014-05-19 NOTE — MAU Provider Note (Signed)
Attestation of Attending Supervision of Advanced Practitioner (PA/CNM/NP): Evaluation and management procedures were performed by the Advanced Practitioner under my supervision and collaboration.  I have reviewed the Advanced Practitioner's note and chart, and I agree with the management and plan.  Shariece Viveiros, MD, FACOG Attending Obstetrician & Gynecologist Faculty Practice, [facility]   

## 2014-09-15 ENCOUNTER — Encounter (HOSPITAL_COMMUNITY): Payer: Self-pay | Admitting: Emergency Medicine

## 2014-09-15 DIAGNOSIS — N898 Other specified noninflammatory disorders of vagina: Secondary | ICD-10-CM | POA: Diagnosis not present

## 2014-09-15 DIAGNOSIS — Z720 Tobacco use: Secondary | ICD-10-CM | POA: Insufficient documentation

## 2014-09-15 DIAGNOSIS — Z3202 Encounter for pregnancy test, result negative: Secondary | ICD-10-CM | POA: Insufficient documentation

## 2014-09-15 DIAGNOSIS — Z8759 Personal history of other complications of pregnancy, childbirth and the puerperium: Secondary | ICD-10-CM | POA: Diagnosis not present

## 2014-09-15 DIAGNOSIS — I10 Essential (primary) hypertension: Secondary | ICD-10-CM | POA: Diagnosis not present

## 2014-09-15 DIAGNOSIS — R102 Pelvic and perineal pain: Secondary | ICD-10-CM | POA: Diagnosis not present

## 2014-09-15 DIAGNOSIS — J45909 Unspecified asthma, uncomplicated: Secondary | ICD-10-CM | POA: Insufficient documentation

## 2014-09-15 DIAGNOSIS — R109 Unspecified abdominal pain: Secondary | ICD-10-CM | POA: Diagnosis present

## 2014-09-15 DIAGNOSIS — M545 Low back pain: Secondary | ICD-10-CM | POA: Insufficient documentation

## 2014-09-15 LAB — LIPASE, BLOOD: Lipase: 36 U/L (ref 11–59)

## 2014-09-15 LAB — URINALYSIS, ROUTINE W REFLEX MICROSCOPIC
BILIRUBIN URINE: NEGATIVE
Glucose, UA: NEGATIVE mg/dL
HGB URINE DIPSTICK: NEGATIVE
Ketones, ur: NEGATIVE mg/dL
Leukocytes, UA: NEGATIVE
Nitrite: NEGATIVE
Protein, ur: NEGATIVE mg/dL
SPECIFIC GRAVITY, URINE: 1.023 (ref 1.005–1.030)
UROBILINOGEN UA: 1 mg/dL (ref 0.0–1.0)
pH: 6.5 (ref 5.0–8.0)

## 2014-09-15 LAB — COMPREHENSIVE METABOLIC PANEL
ALT: 13 U/L (ref 0–35)
AST: 19 U/L (ref 0–37)
Albumin: 3.8 g/dL (ref 3.5–5.2)
Alkaline Phosphatase: 79 U/L (ref 39–117)
Anion gap: 8 (ref 5–15)
BILIRUBIN TOTAL: 0.4 mg/dL (ref 0.3–1.2)
BUN: 10 mg/dL (ref 6–23)
CHLORIDE: 106 meq/L (ref 96–112)
CO2: 27 mmol/L (ref 19–32)
CREATININE: 1.07 mg/dL (ref 0.50–1.10)
Calcium: 9.5 mg/dL (ref 8.4–10.5)
GFR calc non Af Amer: 70 mL/min — ABNORMAL LOW (ref >90)
GFR, EST AFRICAN AMERICAN: 82 mL/min — AB (ref >90)
GLUCOSE: 83 mg/dL (ref 70–99)
Potassium: 3.7 mmol/L (ref 3.5–5.1)
Sodium: 141 mmol/L (ref 135–145)
Total Protein: 7.1 g/dL (ref 6.0–8.3)

## 2014-09-15 LAB — CBC WITH DIFFERENTIAL/PLATELET
Basophils Absolute: 0 10*3/uL (ref 0.0–0.1)
Basophils Relative: 0 % (ref 0–1)
EOS PCT: 2 % (ref 0–5)
Eosinophils Absolute: 0.1 10*3/uL (ref 0.0–0.7)
HCT: 38.8 % (ref 36.0–46.0)
Hemoglobin: 13.3 g/dL (ref 12.0–15.0)
LYMPHS ABS: 3.3 10*3/uL (ref 0.7–4.0)
LYMPHS PCT: 44 % (ref 12–46)
MCH: 28.4 pg (ref 26.0–34.0)
MCHC: 34.3 g/dL (ref 30.0–36.0)
MCV: 82.9 fL (ref 78.0–100.0)
Monocytes Absolute: 0.4 10*3/uL (ref 0.1–1.0)
Monocytes Relative: 5 % (ref 3–12)
Neutro Abs: 3.7 10*3/uL (ref 1.7–7.7)
Neutrophils Relative %: 49 % (ref 43–77)
Platelets: 327 10*3/uL (ref 150–400)
RBC: 4.68 MIL/uL (ref 3.87–5.11)
RDW: 14.6 % (ref 11.5–15.5)
WBC: 7.5 10*3/uL (ref 4.0–10.5)

## 2014-09-15 LAB — POC URINE PREG, ED: Preg Test, Ur: NEGATIVE

## 2014-09-15 NOTE — ED Notes (Signed)
Pt c/o pelvic pain and lower back pain x 1 1/2 months.  C/o pain with urination.  Pt states she was seen at health dept 12/29 for STD check and everything was negative.

## 2014-09-16 ENCOUNTER — Emergency Department (HOSPITAL_COMMUNITY)
Admission: EM | Admit: 2014-09-16 | Discharge: 2014-09-16 | Disposition: A | Discharge status: Home / Self Care | Payer: Medicaid Other | Attending: Emergency Medicine | Admitting: Emergency Medicine

## 2014-09-16 ENCOUNTER — Emergency Department (HOSPITAL_COMMUNITY): Payer: Medicaid Other

## 2014-09-16 DIAGNOSIS — R102 Pelvic and perineal pain: Secondary | ICD-10-CM

## 2014-09-16 DIAGNOSIS — N898 Other specified noninflammatory disorders of vagina: Secondary | ICD-10-CM

## 2014-09-16 LAB — WET PREP, GENITAL
Trich, Wet Prep: NONE SEEN
YEAST WET PREP: NONE SEEN

## 2014-09-16 LAB — GC/CHLAMYDIA PROBE AMP (~~LOC~~) NOT AT ARMC
Chlamydia: NEGATIVE
Neisseria Gonorrhea: NEGATIVE

## 2014-09-16 MED ORDER — CEFTRIAXONE SODIUM 250 MG IJ SOLR
250.0000 mg | Freq: Once | INTRAMUSCULAR | Status: AC
  Administered 2014-09-16: 250 mg via INTRAMUSCULAR
  Filled 2014-09-16: qty 250

## 2014-09-16 MED ORDER — OXYCODONE-ACETAMINOPHEN 5-325 MG PO TABS
1.0000 | ORAL_TABLET | Freq: Once | ORAL | Status: AC
  Administered 2014-09-16: 1 via ORAL
  Filled 2014-09-16: qty 1

## 2014-09-16 MED ORDER — LIDOCAINE HCL (PF) 1 % IJ SOLN
INTRAMUSCULAR | Status: AC
  Filled 2014-09-16: qty 5

## 2014-09-16 MED ORDER — AZITHROMYCIN 250 MG PO TABS
1000.0000 mg | ORAL_TABLET | Freq: Once | ORAL | Status: AC
  Administered 2014-09-16: 1000 mg via ORAL
  Filled 2014-09-16: qty 4

## 2014-09-16 MED ORDER — OXYCODONE-ACETAMINOPHEN 5-325 MG PO TABS: 1.0000 | ORAL_TABLET | ORAL | Status: DC | PRN

## 2014-09-16 NOTE — ED Provider Notes (Signed)
CSN: [id number]     Arrival date & time 09/15/14  2026 History  This chart was scribed for Richarda Blade, MD by Einar Pheasant, ED Scribe. This patient was seen in room A08C/A08C and the patient's care was started at 12:52 AM.    Chief Complaint  Patient presents with  . Abdominal Pain  . Back Pain   The history is provided by the patient and medical records. No language interpreter was used.   HPI Comments: Samantha Yoder is a 29 y.o. female with PMhx of HTN, asthma, epistaxis presents to the Emergency Department complaining of gradual onset pelvic pain that started approximately 1.5 months ago. She is also complaining of dysuria and lower back and right flank pain. Samantha Yoder states that her pain has been gradually increasing for the past 3-4 days. She admits to vaginal discharge. Pt states that she was seen at the health department 08/31/14 for STD and low grade fever of 100 but everything came back negative and was sent home with no specific medication. She endorses taking OTC medication to alleviate her symptoms. Denies any fever, chills, nausea, emesis, nausea, or HA.  Past Medical History  Diagnosis Date  . Hypertension   . Asthma   . Deliberate self-cutting   . Epistaxis    Past Surgical History  Procedure Laterality Date  . Cholecystectomy     Family History  Problem Relation Age of Onset  . COPD Mother   . Depression Mother   . Hypertension Mother   . Hypertension Father   . Drug abuse Brother   . Cancer Maternal Aunt   . Hyperlipidemia Maternal Uncle   . Miscarriages / Stillbirths Maternal Uncle   . Diabetes Maternal Grandmother   . Miscarriages / Stillbirths Paternal Grandmother    History  Substance Use Topics  . Smoking status: Current Every Day Smoker -- 0.50 packs/day for 15 years    Types: Cigarettes  . Smokeless tobacco: Not on file  . Alcohol Use: Yes     Comment: Social drinker    OB History    No data available     Review of Systems   Constitutional: Negative for appetite change.  Respiratory: Negative for cough and shortness of breath.   Gastrointestinal: Positive for abdominal pain. Negative for nausea and vomiting.  Genitourinary: Positive for dysuria.  Musculoskeletal: Positive for back pain. Negative for neck pain.  Neurological: Negative for headaches.  All other systems reviewed and are negative.     Allergies  Other  Home Medications   Prior to Admission medications   Medication Sig Start Date End Date Taking? Authorizing Provider  oxyCODONE-acetaminophen (PERCOCET) 5-325 MG per tablet Take 1 tablet by mouth every 4 (four) hours as needed for severe pain. 09/16/14   Richarda Blade, MD   BP 148/82 mmHg  Pulse 82  Temp(Src) 98.2 F (36.8 C) (Oral)  Resp 16  Ht 4\' 11"  (1.499 m)  Wt 183 lb (83.008 kg)  BMI 36.94 kg/m2  SpO2 97%  LMP 09/06/2014  Physical Exam  Constitutional: She is oriented to person, place, and time. She appears well-developed and well-nourished.  HENT:  Head: Normocephalic and atraumatic.  Eyes: Conjunctivae and EOM are normal. Pupils are equal, round, and reactive to light.  Neck: Normal range of motion and phonation normal. Neck supple.  Cardiovascular: Normal rate and regular rhythm.   Pulmonary/Chest: Effort normal and breath sounds normal. She exhibits no tenderness.  Abdominal: Soft. She exhibits no distension. There is tenderness in  the suprapubic area. There is no guarding.  Suprapubic tenderness with a sense of urgency.  Genitourinary:  No CVA tenderness to percussion.  Normal external female genitalia.  Small amount white vaginal discharge.  Cervix is somewhat inflamed in appearance.  Cervix is friable.  Cervix is exquisitely tender with the maneuver of placing the speculum, and obtaining specimens.  Therefore, a bimanual examination, was deferred.  Musculoskeletal: Normal range of motion.  Neurological: She is alert and oriented to person, place, and time. She  exhibits normal muscle tone.  Skin: Skin is warm and dry.  Psychiatric: She has a normal mood and affect. Her behavior is normal. Judgment and thought content normal.  Nursing note and vitals reviewed.   ED Course  Procedures (including critical care time)  DIAGNOSTIC STUDIES: Oxygen Saturation is 97% on RA, adequate by my interpretation.    COORDINATION OF CARE: Medications  oxyCODONE-acetaminophen (PERCOCET/ROXICET) 5-325 MG per tablet 1 tablet (1 tablet Oral Given 09/16/14 0321)  cefTRIAXone (ROCEPHIN) injection 250 mg (250 mg Intramuscular Given 09/16/14 0634)  azithromycin (ZITHROMAX) tablet 1,000 mg (1,000 mg Oral Given 09/16/14 1191)    Patient Vitals for the past 24 hrs:  BP Temp Temp src Pulse Resp SpO2  09/16/14 0736 134/72 mmHg 97.8 F (36.6 C) Oral 77 20 100 %   12:57 AM- Pt advised of plan for treatment and pt agrees.   Results for orders placed or performed during the hospital encounter of 09/16/14  Wet prep, genital  Result Value Ref Range   Yeast Wet Prep HPF POC NONE SEEN NONE SEEN   Trich, Wet Prep NONE SEEN NONE SEEN   Clue Cells Wet Prep HPF POC FEW (A) NONE SEEN   WBC, Wet Prep HPF POC FEW (A) NONE SEEN  CBC with Differential  Result Value Ref Range   WBC 7.5 4.0 - 10.5 K/uL   RBC 4.68 3.87 - 5.11 MIL/uL   Hemoglobin 13.3 12.0 - 15.0 g/dL   HCT 38.8 36.0 - 46.0 %   MCV 82.9 78.0 - 100.0 fL   MCH 28.4 26.0 - 34.0 pg   MCHC 34.3 30.0 - 36.0 g/dL   RDW 14.6 11.5 - 15.5 %   Platelets 327 150 - 400 K/uL   Neutrophils Relative % 49 43 - 77 %   Neutro Abs 3.7 1.7 - 7.7 K/uL   Lymphocytes Relative 44 12 - 46 %   Lymphs Abs 3.3 0.7 - 4.0 K/uL   Monocytes Relative 5 3 - 12 %   Monocytes Absolute 0.4 0.1 - 1.0 K/uL   Eosinophils Relative 2 0 - 5 %   Eosinophils Absolute 0.1 0.0 - 0.7 K/uL   Basophils Relative 0 0 - 1 %   Basophils Absolute 0.0 0.0 - 0.1 K/uL  Comprehensive metabolic panel  Result Value Ref Range   Sodium 141 135 - 145 mmol/L    Potassium 3.7 3.5 - 5.1 mmol/L   Chloride 106 96 - 112 mEq/L   CO2 27 19 - 32 mmol/L   Glucose, Bld 83 70 - 99 mg/dL   BUN 10 6 - 23 mg/dL   Creatinine, Ser 1.07 0.50 - 1.10 mg/dL   Calcium 9.5 8.4 - 10.5 mg/dL   Total Protein 7.1 6.0 - 8.3 g/dL   Albumin 3.8 3.5 - 5.2 g/dL   AST 19 0 - 37 U/L   ALT 13 0 - 35 U/L   Alkaline Phosphatase 79 39 - 117 U/L   Total Bilirubin 0.4 0.3 - 1.2  mg/dL   GFR calc non Af Amer 70 (L) >90 mL/min   GFR calc Af Amer 82 (L) >90 mL/min   Anion gap 8 5 - 15  Lipase, blood  Result Value Ref Range   Lipase 36 11 - 59 U/L  Urinalysis, Routine w reflex microscopic  Result Value Ref Range   Color, Urine YELLOW YELLOW   APPearance CLEAR CLEAR   Specific Gravity, Urine 1.023 1.005 - 1.030   pH 6.5 5.0 - 8.0   Glucose, UA NEGATIVE NEGATIVE mg/dL   Hgb urine dipstick NEGATIVE NEGATIVE   Bilirubin Urine NEGATIVE NEGATIVE   Ketones, ur NEGATIVE NEGATIVE mg/dL   Protein, ur NEGATIVE NEGATIVE mg/dL   Urobilinogen, UA 1.0 0.0 - 1.0 mg/dL   Nitrite NEGATIVE NEGATIVE   Leukocytes, UA NEGATIVE NEGATIVE  RPR  Result Value Ref Range   RPR Ser Ql Non Reactive Non Reactive  HIV antibody  Result Value Ref Range   HIV 1/O/2 Abs-Index Value <1.00 <1.00   HIV-1/HIV-2 Ab Non Reactive Non Reactive  POC Urine Pregnancy, ED  (If Pre-menopausal female) - do not order at [facility]  Result Value Ref Range   Preg Test, Ur NEGATIVE NEGATIVE  GC/Chlamydia probe amp ([place])  Result Value Ref Range   Chlamydia CT: Negative    Neisseria gonorrhea NG: Negative     MDM   Final diagnoses:  Pelvic pain in female  Vaginal discharge    Nonspecific pelvic pain.  Vaginal exams abnormal and consistent with cervicitis.  Covered for STD, in the emergency department.  She will need follow-up, cause you for ongoing pelvic pain.  There is no evidence for acute ovarian or uterine abnormality.  Nursing Notes Reviewed/ Care Coordinated Applicable Imaging Reviewed Interpretation of  Laboratory Data incorporated into ED treatment  The patient appears reasonably screened and/or stabilized for discharge and I doubt any other medical condition or other [facility] requiring further screening, evaluation, or treatment in the ED at this time prior to discharge.  Plan: Home Medications- usual; Home Treatments- rest, heat; return here if the recommended treatment, does not improve the symptoms; Recommended follow up- GYN asap   I personally performed the services described in this documentation, which was scribed in my presence. The recorded information has been reviewed and is accurate.      Richarda Blade, MD 09/17/14 [phone]

## 2014-09-16 NOTE — ED Notes (Signed)
Called CT, pt is second in line for [place]

## 2014-09-16 NOTE — ED Notes (Signed)
States the pain medicine "took the edge off her pain "

## 2014-09-16 NOTE — ED Notes (Signed)
Pt reports lower abdominal pain and left back pain x6 months.  Pt also reports brown vaginal d/c and was tested 2 weeks ago for STDs and was negative.  Pt noticed burning while urination, and also vaginal itching.

## 2014-09-16 NOTE — Discharge Instructions (Signed)
Abdominal Pain, Women °Abdominal (stomach, pelvic, or belly) pain can be caused by many things. It is important to tell your doctor: °· The location of the pain. °· Does it come and go or is it present all the time? °· Are there things that start the pain (eating certain foods, exercise)? °· Are there other symptoms associated with the pain (fever, nausea, vomiting, diarrhea)? °All of this is helpful to know when trying to find the cause of the pain. °CAUSES  °· Stomach: virus or bacteria infection, or ulcer. °· Intestine: appendicitis (inflamed appendix), regional ileitis (Crohn's disease), ulcerative colitis (inflamed colon), irritable bowel syndrome, diverticulitis (inflamed diverticulum of the colon), or cancer of the stomach or intestine. °· Gallbladder disease or stones in the gallbladder. °· Kidney disease, kidney stones, or infection. °· Pancreas infection or cancer. °· Fibromyalgia (pain disorder). °· Diseases of the female organs: °¨ Uterus: fibroid (non-cancerous) tumors or infection. °¨ Fallopian tubes: infection or tubal pregnancy. °¨ Ovary: cysts or tumors. °¨ Pelvic adhesions (scar tissue). °¨ Endometriosis (uterus lining tissue growing in the pelvis and on the pelvic organs). °¨ Pelvic congestion syndrome (female organs filling up with blood just before the menstrual period). °¨ Pain with the menstrual period. °¨ Pain with ovulation (producing an egg). °¨ Pain with an IUD (intrauterine device, birth control) in the uterus. °¨ Cancer of the female organs. °· Functional pain (pain not caused by a disease, may improve without treatment). °· Psychological pain. °· Depression. °DIAGNOSIS  °Your doctor will decide the seriousness of your pain by doing an examination. °· Blood tests. °· X-rays. °· Ultrasound. °· CT scan (computed tomography, special type of X-ray). °· MRI (magnetic resonance imaging). °· Cultures, for infection. °· Barium enema (dye inserted in the large intestine, to better view it with  X-rays). °· Colonoscopy (looking in intestine with a lighted tube). °· Laparoscopy (minor surgery, looking in abdomen with a lighted tube). °· Major abdominal exploratory surgery (looking in abdomen with a large incision). °TREATMENT  °The treatment will depend on the cause of the pain.  °· Many cases can be observed and treated at home. °· Over-the-counter medicines recommended by your caregiver. °· Prescription medicine. °· Antibiotics, for infection. °· Birth control pills, for painful periods or for ovulation pain. °· Hormone treatment, for endometriosis. °· Nerve blocking injections. °· Physical therapy. °· Antidepressants. °· Counseling with a psychologist or psychiatrist. °· Minor or major surgery. °HOME CARE INSTRUCTIONS  °· Do not take laxatives, unless directed by your caregiver. °· Take over-the-counter pain medicine only if ordered by your caregiver. Do not take aspirin because it can cause an upset stomach or bleeding. °· Try a clear liquid diet (broth or water) as ordered by your caregiver. Slowly move to a bland diet, as tolerated, if the pain is related to the stomach or intestine. °· Have a thermometer and take your temperature several times a day, and record it. °· Bed rest and sleep, if it helps the pain. °· Avoid sexual intercourse, if it causes pain. °· Avoid stressful situations. °· Keep your follow-up appointments and tests, as your caregiver orders. °· If the pain does not go away with medicine or surgery, you may try: °¨ Acupuncture. °¨ Relaxation exercises (yoga, meditation). °¨ Group therapy. °¨ Counseling. °SEEK MEDICAL CARE IF:  °· You notice certain foods cause stomach pain. °· Your home care treatment is not helping your pain. °· You need stronger pain medicine. °· You want your IUD removed. °· You feel faint or   lightheaded. °· You develop nausea and vomiting. °· You develop a rash. °· You are having side effects or an allergy to your medicine. °SEEK IMMEDIATE MEDICAL CARE IF:  °· Your  pain does not go away or gets worse. °· You have a fever. °· Your pain is felt only in portions of the abdomen. The right side could possibly be appendicitis. The left lower portion of the abdomen could be colitis or diverticulitis. °· You are passing blood in your stools (bright red or black tarry stools, with or without vomiting). °· You have blood in your urine. °· You develop chills, with or without a fever. °· You pass out. °MAKE SURE YOU:  °· Understand these instructions. °· Will watch your condition. °· Will get help right away if you are not doing well or get worse. °Document Released: 06/17/2007 Document Revised: 01/04/2014 Document Reviewed: 07/07/2009 °[employer]® Patient Information ©2015 [employer]. This information is not intended to replace advice given to you by your health care provider. Make sure you discuss any questions you have with your health care provider. ° °

## 2014-09-16 NOTE — ED Notes (Signed)
Attempted to call US with no answer.

## 2014-09-17 LAB — RPR: RPR Ser Ql: NONREACTIVE

## 2014-09-17 LAB — HIV ANTIBODY (ROUTINE TESTING W REFLEX)
HIV 1/O/2 Abs-Index Value: <1 (ref <1.00)
HIV-1/HIV-2 Ab: NONREACTIVE

## 2014-11-01 ENCOUNTER — Encounter: Payer: Medicaid Other | Admitting: Obstetrics & Gynecology

## 2014-11-03 ENCOUNTER — Encounter: Payer: Medicaid Other | Admitting: Obstetrics & Gynecology

## 2014-11-05 ENCOUNTER — Encounter (HOSPITAL_COMMUNITY): Payer: Self-pay | Admitting: Emergency Medicine

## 2014-11-05 DIAGNOSIS — Z791 Long term (current) use of non-steroidal anti-inflammatories (NSAID): Secondary | ICD-10-CM | POA: Insufficient documentation

## 2014-11-05 DIAGNOSIS — N760 Acute vaginitis: Secondary | ICD-10-CM | POA: Diagnosis not present

## 2014-11-05 DIAGNOSIS — L02215 Cutaneous abscess of perineum: Secondary | ICD-10-CM | POA: Diagnosis not present

## 2014-11-05 DIAGNOSIS — Z79899 Other long term (current) drug therapy: Secondary | ICD-10-CM | POA: Insufficient documentation

## 2014-11-05 DIAGNOSIS — J45909 Unspecified asthma, uncomplicated: Secondary | ICD-10-CM | POA: Diagnosis not present

## 2014-11-05 DIAGNOSIS — N858 Other specified noninflammatory disorders of uterus: Secondary | ICD-10-CM | POA: Diagnosis not present

## 2014-11-05 DIAGNOSIS — I10 Essential (primary) hypertension: Secondary | ICD-10-CM | POA: Diagnosis not present

## 2014-11-05 DIAGNOSIS — Z7952 Long term (current) use of systemic steroids: Secondary | ICD-10-CM | POA: Diagnosis not present

## 2014-11-05 DIAGNOSIS — Z720 Tobacco use: Secondary | ICD-10-CM | POA: Diagnosis not present

## 2014-11-05 DIAGNOSIS — R1031 Right lower quadrant pain: Secondary | ICD-10-CM | POA: Diagnosis present

## 2014-11-05 DIAGNOSIS — Z3202 Encounter for pregnancy test, result negative: Secondary | ICD-10-CM | POA: Insufficient documentation

## 2014-11-05 NOTE — ED Notes (Signed)
Pt with known L ovarian cyst and reports pain to that area and sts same pain on R side. Pt also reports abscess to L breast that drained last night and another one to groin. Pt sts she had a pelvic exam completed and told she had a bacterial infection but unable to get prescription. Pt tx in past for chlamydia and gonarrhea and sx got worse but sts tests were negative. Pt c/o pelvic pain/pressure and difficulty having intercourse.

## 2014-11-06 ENCOUNTER — Emergency Department (HOSPITAL_COMMUNITY)
Admission: EM | Admit: 2014-11-06 | Discharge: 2014-11-06 | Disposition: A | Discharge status: Home / Self Care | Payer: Medicaid Other | Attending: Emergency Medicine | Admitting: Emergency Medicine

## 2014-11-06 DIAGNOSIS — N760 Acute vaginitis: Secondary | ICD-10-CM

## 2014-11-06 DIAGNOSIS — N888 Other specified noninflammatory disorders of cervix uteri: Secondary | ICD-10-CM

## 2014-11-06 DIAGNOSIS — B9689 Other specified bacterial agents as the cause of diseases classified elsewhere: Secondary | ICD-10-CM

## 2014-11-06 LAB — WET PREP, GENITAL
Trich, Wet Prep: NONE SEEN
Yeast Wet Prep HPF POC: NONE SEEN

## 2014-11-06 LAB — URINALYSIS, ROUTINE W REFLEX MICROSCOPIC
Glucose, UA: NEGATIVE mg/dL
KETONES UR: NEGATIVE mg/dL
Leukocytes, UA: NEGATIVE
Nitrite: NEGATIVE
PH: 5.5 (ref 5.0–8.0)
Protein, ur: NEGATIVE mg/dL
SPECIFIC GRAVITY, URINE: 1.039 — AB (ref 1.005–1.030)
Urobilinogen, UA: 1 mg/dL (ref 0.0–1.0)

## 2014-11-06 LAB — URINE MICROSCOPIC-ADD ON

## 2014-11-06 LAB — POC URINE PREG, ED: PREG TEST UR: NEGATIVE

## 2014-11-06 MED ORDER — HYDROCODONE-ACETAMINOPHEN 5-325 MG PO TABS: 1.0000 | ORAL_TABLET | Freq: Two times a day (BID) | ORAL | Status: DC | PRN

## 2014-11-06 MED ORDER — FLUCONAZOLE 150 MG PO TABS: 150.0000 mg | ORAL_TABLET | Freq: Once | ORAL | Status: DC

## 2014-11-06 MED ORDER — METRONIDAZOLE 500 MG PO TABS
500.0000 mg | ORAL_TABLET | Freq: Two times a day (BID) | ORAL | Status: DC
Start: 2014-11-06 — End: 2014-12-13

## 2014-11-06 MED ORDER — METRONIDAZOLE 500 MG PO TABS: 2000.0000 mg | ORAL_TABLET | Freq: Once | ORAL | Status: DC

## 2014-11-06 MED ORDER — HYDROCODONE-ACETAMINOPHEN 5-325 MG PO TABS
2.0000 | ORAL_TABLET | Freq: Once | ORAL | Status: AC
  Administered 2014-11-06: 2 via ORAL
  Filled 2014-11-06: qty 2

## 2014-11-06 MED ORDER — METRONIDAZOLE 500 MG PO TABS
500.0000 mg | ORAL_TABLET | Freq: Once | ORAL | Status: AC
Start: 2014-11-06 — End: 2014-11-06
  Administered 2014-11-06: 500 mg via ORAL
  Filled 2014-11-06: qty 1

## 2014-11-06 NOTE — ED Provider Notes (Signed)
CSN: [id number]     Arrival date & time 11/05/14  2209 History  This chart was scribed for Samantha Balls, MD by Evelene Croon, ED Scribe. This patient was seen in room D33C/D33C and the patient's care was started 1:34 AM.    Chief Complaint  Patient presents with  . Abdominal Pain    The history is provided by the patient. No language interpreter was used.   HPI Comments:  Samantha Yoder is a 29 y.o. female with a h/o left ovarian cyst and abdominal surgeries who presents to the Emergency Department complaining of moderate constant RLQ abdominal pain that started on 11/04/14. She states pain is similar to pain felt with past ovarian cyst. She also reports abnormal vaginal discharge for months and boil in her groin area. Pt notes she was called by GYN a few days ago and informed that she had BV and needed antibiotics but has been unable to pick up her prescription. She has applied a heating pad to her abdominal area and taken tylenol without relief.     Past Medical History  Diagnosis Date  . Hypertension   . Asthma   . Deliberate self-cutting   . Epistaxis    Past Surgical History  Procedure Laterality Date  . Cholecystectomy     Family History  Problem Relation Age of Onset  . COPD Mother   . Depression Mother   . Hypertension Mother   . Hypertension Father   . Drug abuse Brother   . Cancer Maternal Aunt   . Hyperlipidemia Maternal Uncle   . Miscarriages / Stillbirths Maternal Uncle   . Diabetes Maternal Grandmother   . Miscarriages / Stillbirths Paternal Grandmother    History  Substance Use Topics  . Smoking status: Current Every Day Smoker -- 0.50 packs/day for 15 years    Types: Cigarettes  . Smokeless tobacco: Not on file  . Alcohol Use: Yes     Comment: Social drinker    OB History    No data available     Review of Systems  Gastrointestinal: Positive for abdominal pain.  Genitourinary: Positive for vaginal discharge.  Skin: Positive for rash (Boil).  All  other systems reviewed and are negative.     Allergies  Other  Home Medications   Prior to Admission medications   Medication Sig Start Date End Date Taking? Authorizing Provider  Aspirin-Acetaminophen-Caffeine (EXCEDRIN PO) Take 2 tablets by mouth daily as needed (pain).   Yes Historical Provider, MD  fluconazole (DIFLUCAN) 150 MG tablet Take 150 mg by mouth daily.   Yes Historical Provider, MD  hydrocortisone cream 1 % Apply 1 application topically 2 (two) times daily.   Yes Historical Provider, MD  naproxen sodium (ANAPROX) 220 MG tablet Take 440 mg by mouth 2 (two) times daily with a meal.   Yes Historical Provider, MD  oxyCODONE-acetaminophen (PERCOCET) 5-325 MG per tablet Take 1 tablet by mouth every 4 (four) hours as needed for severe pain. Patient not taking: Reported on 11/05/2014 09/16/14   Richarda Blade, MD   BP 102/59 mmHg  Pulse 85  Temp(Src) 98.1 F (36.7 C) (Oral)  Resp 18  SpO2 100%  LMP 11/02/2014 Physical Exam  Constitutional: She is oriented to person, place, and time. She appears well-developed and well-nourished. No distress.  HENT:  Head: Normocephalic and atraumatic.  Nose: Nose normal.  Mouth/Throat: Oropharynx is clear and moist. No oropharyngeal exudate.  Eyes: Conjunctivae and EOM are normal. Pupils are equal, round, and reactive to  light. No scleral icterus.  Neck: Normal range of motion. Neck supple. No JVD present. No tracheal deviation present. No thyromegaly present.  Cardiovascular: Normal rate, regular rhythm and normal heart sounds.  Exam reveals no gallop and no friction rub.   No murmur heard. Pulmonary/Chest: Effort normal and breath sounds normal. No respiratory distress. She has no wheezes. She exhibits no tenderness.  Abdominal: Soft. Bowel sounds are normal. She exhibits no distension and no mass. There is no tenderness. There is no rebound and no guarding.  Genitourinary: Vagina normal and uterus normal. No vaginal discharge found.   Abnormal mass seen on the cervix. No CMT or adnexal tenderness.  Musculoskeletal: Normal range of motion. She exhibits no edema or tenderness.  Lymphadenopathy:    She has no cervical adenopathy.  Neurological: She is alert and oriented to person, place, and time. No cranial nerve deficit. She exhibits normal muscle tone.  Skin: Skin is warm and dry. No rash noted. No erythema. No pallor.  Small 0.5cm abcess seen between anus and vagina.  There is TTP.  There is no warmth, area appears to have been recently draining.  Nursing note and vitals reviewed.   ED Course  Procedures   DIAGNOSTIC STUDIES:  Oxygen Saturation is 100% on RA, normal by my interpretation.    COORDINATION OF CARE:  1:43 AM Will order UA. Discussed treatment plan with pt at bedside and pt agreed to plan.  Labs Review Labs Reviewed  URINALYSIS, ROUTINE W REFLEX MICROSCOPIC - Abnormal; Notable for the following:    APPearance CLOUDY (*)    Specific Gravity, Urine 1.039 (*)    Hgb urine dipstick LARGE (*)    Bilirubin Urine SMALL (*)    All other components within normal limits  URINE MICROSCOPIC-ADD ON - Abnormal; Notable for the following:    Squamous Epithelial / LPF FEW (*)    Bacteria, UA FEW (*)    All other components within normal limits  WET PREP, GENITAL  POC URINE PREG, ED  GC/CHLAMYDIA PROBE AMP ()    Imaging Review No results found.   EKG Interpretation None      MDM   Final diagnoses:  None    Patient since emergency department for chronic pelvic pain. Her pain is normally on the left however is bilateral today. She does have a history of ovarian cyst and feels like this is the same. Patient has had recent evaluation by her OB/GYN including screening for cervical cancer which has been negative. She did not pick up her recent antibiotics for her BV infection. Physical exam reveals a concerning mass on her cervix. This was relayed to the patient, and her need for gynecology  oncology follow-up. Patient demonstrated understanding and will get an appointment with her primary OB/GYN. She was treated for BV and will be sent home with a prescription for Flagyl. At this time her pain is well controlled, she will get a Norco prescription for a short course as well. Vital signs remain within her normal limits and she is safe for discharge.  I personally performed the services described in this documentation, which was scribed in my presence. The recorded information has been reviewed and is accurate.    Samantha Balls, MD 11/06/14 [phone]

## 2014-11-06 NOTE — Discharge Instructions (Signed)
Pelvic Pain Samantha Yoder, your exam shows an abnormal mass on your cervix. Follow-up with her OB/GYN within 3 days for continued evaluation of this. Take antibiotics as prescribed. If symptoms worsen come back to emergency department immediately. Thank you. Pelvic pain is pain felt below the belly button and between your hips. It can be caused by many different things. It is important to get help right away. This is especially true for severe, sharp, or unusual pain that comes on suddenly.  HOME CARE  Only take medicine as told by your doctor.  Rest as told by your doctor.  Eat a healthy diet, such as fruits, vegetables, and lean meats.  Drink enough fluids to keep your pee (urine) clear or pale yellow, or as told.  Avoid sex (intercourse) if it causes pain.  Apply warm or cold packs to your lower belly (abdomen). Use the type of pack that helps the pain.  Avoid situations that cause you stress.  Keep a journal to track your pain. Write down:  When the pain started.  Where it is located.  If there are things that seem to be related to the pain, such as food or your period.  Follow up with your doctor as told. GET HELP RIGHT AWAY IF:   You have heavy bleeding from the vagina.  You have more pelvic pain.  You feel lightheaded or pass out (faint).  You have chills.  You have pain when you pee or have blood in your pee.  You cannot stop having watery poop (diarrhea).  You cannot stop throwing up (vomiting).  You have a fever or lasting symptoms for more than 3 days.  You have a fever and your symptoms suddenly get worse.  You are being physically or sexually abused.  Your medicine does not help your pain.  You have fluid (discharge) coming from your vagina that is not normal. MAKE SURE YOU:  Understand these instructions.  Will watch your condition.  Will get help if you are not doing well or get worse. Document Released: 02/06/2008 Document Revised: 02/19/2012  Document Reviewed: 12/10/2011 [facility] Patient Information 2015 [place], [place]. This information is not intended to replace advice given to you by your health care provider. Make sure you discuss any questions you have with your health care provider.

## 2014-11-08 LAB — GC/CHLAMYDIA PROBE AMP (~~LOC~~) NOT AT ARMC
Chlamydia: NEGATIVE
Neisseria Gonorrhea: NEGATIVE

## 2014-12-13 ENCOUNTER — Inpatient Hospital Stay (HOSPITAL_COMMUNITY)
Admission: AD | Admit: 2014-12-13 | Discharge: 2014-12-13 | Disposition: A | Discharge status: Home / Self Care | Payer: Medicaid Other | Source: Ambulatory Visit | Attending: Obstetrics & Gynecology | Admitting: Obstetrics & Gynecology

## 2014-12-13 ENCOUNTER — Encounter (HOSPITAL_COMMUNITY): Payer: Self-pay | Admitting: *Deleted

## 2014-12-13 DIAGNOSIS — A499 Bacterial infection, unspecified: Secondary | ICD-10-CM | POA: Diagnosis not present

## 2014-12-13 DIAGNOSIS — R102 Pelvic and perineal pain: Secondary | ICD-10-CM | POA: Insufficient documentation

## 2014-12-13 DIAGNOSIS — F17210 Nicotine dependence, cigarettes, uncomplicated: Secondary | ICD-10-CM | POA: Diagnosis not present

## 2014-12-13 DIAGNOSIS — J45909 Unspecified asthma, uncomplicated: Secondary | ICD-10-CM | POA: Diagnosis not present

## 2014-12-13 DIAGNOSIS — B9689 Other specified bacterial agents as the cause of diseases classified elsewhere: Secondary | ICD-10-CM | POA: Insufficient documentation

## 2014-12-13 DIAGNOSIS — N760 Acute vaginitis: Secondary | ICD-10-CM | POA: Diagnosis not present

## 2014-12-13 DIAGNOSIS — I10 Essential (primary) hypertension: Secondary | ICD-10-CM | POA: Insufficient documentation

## 2014-12-13 LAB — WET PREP, GENITAL
TRICH WET PREP: NONE SEEN
Yeast Wet Prep HPF POC: NONE SEEN

## 2014-12-13 LAB — CBC
HEMATOCRIT: 39.7 % (ref 36.0–46.0)
Hemoglobin: 13.4 g/dL (ref 12.0–15.0)
MCH: 28.1 pg (ref 26.0–34.0)
MCHC: 33.8 g/dL (ref 30.0–36.0)
MCV: 83.2 fL (ref 78.0–100.0)
Platelets: 275 10*3/uL (ref 150–400)
RBC: 4.77 MIL/uL (ref 3.87–5.11)
RDW: 14.1 % (ref 11.5–15.5)
WBC: 8.5 10*3/uL (ref 4.0–10.5)

## 2014-12-13 LAB — URINALYSIS, ROUTINE W REFLEX MICROSCOPIC
Bilirubin Urine: NEGATIVE
GLUCOSE, UA: NEGATIVE mg/dL
Hgb urine dipstick: NEGATIVE
KETONES UR: NEGATIVE mg/dL
LEUKOCYTES UA: NEGATIVE
Nitrite: NEGATIVE
PROTEIN: NEGATIVE mg/dL
Specific Gravity, Urine: 1.025 (ref 1.005–1.030)
UROBILINOGEN UA: 0.2 mg/dL (ref 0.0–1.0)
pH: 5.5 (ref 5.0–8.0)

## 2014-12-13 LAB — POCT PREGNANCY, URINE: PREG TEST UR: NEGATIVE

## 2014-12-13 MED ORDER — METRONIDAZOLE 0.75 % VA GEL: 1.0000 | Freq: Two times a day (BID) | VAGINAL | Status: DC

## 2014-12-13 MED ORDER — FLUCONAZOLE 150 MG PO TABS: 150.0000 mg | ORAL_TABLET | Freq: Once | ORAL | Status: DC

## 2014-12-13 MED ORDER — IBUPROFEN 800 MG PO TABS: 800.0000 mg | ORAL_TABLET | Freq: Three times a day (TID) | ORAL | Status: DC

## 2014-12-13 MED ORDER — IBUPROFEN 800 MG PO TABS
800.0000 mg | ORAL_TABLET | Freq: Once | ORAL | Status: AC
  Administered 2014-12-13: 800 mg via ORAL
  Filled 2014-12-13: qty 1

## 2014-12-13 NOTE — Discharge Instructions (Signed)
Abdominal Pain, Women °Abdominal (stomach, pelvic, or belly) pain can be caused by many things. It is important to tell your doctor: °· The location of the pain. °· Does it come and go or is it present all the time? °· Are there things that start the pain (eating certain foods, exercise)? °· Are there other symptoms associated with the pain (fever, nausea, vomiting, diarrhea)? °All of this is helpful to know when trying to find the cause of the pain. °CAUSES  °· Stomach: virus or bacteria infection, or ulcer. °· Intestine: appendicitis (inflamed appendix), regional ileitis (Crohn's disease), ulcerative colitis (inflamed colon), irritable bowel syndrome, diverticulitis (inflamed diverticulum of the colon), or cancer of the stomach or intestine. °· Gallbladder disease or stones in the gallbladder. °· Kidney disease, kidney stones, or infection. °· Pancreas infection or cancer. °· Fibromyalgia (pain disorder). °· Diseases of the female organs: °¨ Uterus: fibroid (non-cancerous) tumors or infection. °¨ Fallopian tubes: infection or tubal pregnancy. °¨ Ovary: cysts or tumors. °¨ Pelvic adhesions (scar tissue). °¨ Endometriosis (uterus lining tissue growing in the pelvis and on the pelvic organs). °¨ Pelvic congestion syndrome (female organs filling up with blood just before the menstrual period). °¨ Pain with the menstrual period. °¨ Pain with ovulation (producing an egg). °¨ Pain with an IUD (intrauterine device, birth control) in the uterus. °¨ Cancer of the female organs. °· Functional pain (pain not caused by a disease, may improve without treatment). °· Psychological pain. °· Depression. °DIAGNOSIS  °Your doctor will decide the seriousness of your pain by doing an examination. °· Blood tests. °· X-rays. °· Ultrasound. °· CT scan (computed tomography, special type of X-ray). °· MRI (magnetic resonance imaging). °· Cultures, for infection. °· Barium enema (dye inserted in the large intestine, to better view it with  X-rays). °· Colonoscopy (looking in intestine with a lighted tube). °· Laparoscopy (minor surgery, looking in abdomen with a lighted tube). °· Major abdominal exploratory surgery (looking in abdomen with a large incision). °TREATMENT  °The treatment will depend on the cause of the pain.  °· Many cases can be observed and treated at home. °· Over-the-counter medicines recommended by your caregiver. °· Prescription medicine. °· Antibiotics, for infection. °· Birth control pills, for painful periods or for ovulation pain. °· Hormone treatment, for endometriosis. °· Nerve blocking injections. °· Physical therapy. °· Antidepressants. °· Counseling with a psychologist or psychiatrist. °· Minor or major surgery. °HOME CARE INSTRUCTIONS  °· Do not take laxatives, unless directed by your caregiver. °· Take over-the-counter pain medicine only if ordered by your caregiver. Do not take aspirin because it can cause an upset stomach or bleeding. °· Try a clear liquid diet (broth or water) as ordered by your caregiver. Slowly move to a bland diet, as tolerated, if the pain is related to the stomach or intestine. °· Have a thermometer and take your temperature several times a day, and record it. °· Bed rest and sleep, if it helps the pain. °· Avoid sexual intercourse, if it causes pain. °· Avoid stressful situations. °· Keep your follow-up appointments and tests, as your caregiver orders. °· If the pain does not go away with medicine or surgery, you may try: °¨ Acupuncture. °¨ Relaxation exercises (yoga, meditation). °¨ Group therapy. °¨ Counseling. °SEEK MEDICAL CARE IF:  °· You notice certain foods cause stomach pain. °· Your home care treatment is not helping your pain. °· You need stronger pain medicine. °· You want your IUD removed. °· You feel faint or   lightheaded. °· You develop nausea and vomiting. °· You develop a rash. °· You are having side effects or an allergy to your medicine. °SEEK IMMEDIATE MEDICAL CARE IF:  °· Your  pain does not go away or gets worse. °· You have a fever. °· Your pain is felt only in portions of the abdomen. The right side could possibly be appendicitis. The left lower portion of the abdomen could be colitis or diverticulitis. °· You are passing blood in your stools (bright red or black tarry stools, with or without vomiting). °· You have blood in your urine. °· You develop chills, with or without a fever. °· You pass out. °MAKE SURE YOU:  °· Understand these instructions. °· Will watch your condition. °· Will get help right away if you are not doing well or get worse. °Document Released: 06/17/2007 Document Revised: 01/04/2014 Document Reviewed: 07/07/2009 °[employer]® Patient Information ©2015 [employer]. This information is not intended to replace advice given to you by your health care provider. Make sure you discuss any questions you have with your health care provider. ° °

## 2014-12-13 NOTE — MAU Provider Note (Signed)
History     CSN: [id number]  Arrival date and time: 12/13/14 1448   First Provider Initiated Contact with Patient 12/13/14 [phone]      Chief Complaint  Patient presents with  . Pelvic Pain   HPI   Samantha Yoder is a 29 year old G1P1 with PMH of HTN, Asthma, Endometriosis, and left adnexal cyst presenting for vaginal odor and pelvic pain. The patient reports having been diagnosed with BV a few months ago but was unable to complete the treatment with Flagyl due to nausea and vomiting. She was seen in the [facility] ED in March for her infection and pelvic pain but did not complete her course of treatment at that time either. She did follow-up with her OB-GYN (Dr. Humberto Seals) in [place], [place] last month and says he performed a PAP and checked for STI's and several other things, but she says the only results she was informed of was she was positive for BV and her PAP was normal.   She reports still having a "fishy odor" from her vagina that has been present for over 3 months. She also notes a brownish discharge present for the past week. She reports having irritation and itchiness as well. Her LMP was 12/06/2014 and was 5 days in duration with normal flow. She does report having unprotected sex 3 times in the past 3 months, but says she is not concerned about having an STI.  As for her abdominal pain, she reports it has been present for several years but fluctuates in severity. She has been experiencing an intermittent, shooting pain in her LLQ for the past 3 days. She has tried taking Tylenol, Advil, and Motrin but has not received any relief. She denies any association between food intake and the pain severity. She has been doing a "detox" diet for the past 3 days consisting of a cyan pepper drink along with consuming more fruits and vegetables. Denies any nausea, vomiting, diarrhea, or constipation.   She was scheduled in March in the [facility] and missed her appointment.     Past Medical History   Diagnosis Date  . Hypertension   . Asthma   . Deliberate self-cutting   . Epistaxis     Past Surgical History  Procedure Laterality Date  . Cholecystectomy      Family History  Problem Relation Age of Onset  . COPD Mother   . Depression Mother   . Hypertension Mother   . Hypertension Father   . Drug abuse Brother   . Cancer Maternal Aunt   . Hyperlipidemia Maternal Uncle   . Miscarriages / Stillbirths Maternal Uncle   . Diabetes Maternal Grandmother   . Miscarriages / Stillbirths Paternal Grandmother     History  Substance Use Topics  . Smoking status: Current Every Day Smoker -- 0.50 packs/day for 15 years    Types: Cigarettes  . Smokeless tobacco: Not on file  . Alcohol Use: Yes     Comment: Social drinker     Allergies:  Allergies  Allergen Reactions  . Other     clorox makes her "itch really bad". Allergy to "fresh roses" also.     Prescriptions prior to admission  Medication Sig Dispense Refill Last Dose  . Aspirin-Acetaminophen-Caffeine (EXCEDRIN PO) Take 2 tablets by mouth daily as needed (pain).   11/05/2014 at 500 pm  . fluconazole (DIFLUCAN) 150 MG tablet Take 1 tablet (150 mg total) by mouth once. 1 tablet 0   . HYDROcodone-acetaminophen (NORCO/VICODIN) 5-325  MG per tablet Take 1 tablet by mouth 2 (two) times daily as needed for severe pain. 8 tablet 0   . hydrocortisone cream 1 % Apply 1 application topically 2 (two) times daily.   11/05/2014 at Unknown time  . metroNIDAZOLE (FLAGYL) 500 MG tablet Take 1 tablet (500 mg total) by mouth 2 (two) times daily. 14 tablet 0   . naproxen sodium (ANAPROX) 220 MG tablet Take 440 mg by mouth 2 (two) times daily with a meal.   11/04/2014 at Unknown time  . oxyCODONE-acetaminophen (PERCOCET) 5-325 MG per tablet Take 1 tablet by mouth every 4 (four) hours as needed for severe pain. (Patient not taking: Reported on 11/05/2014) 20 tablet 0 Completed Course at Unknown time   Results for orders placed or performed during the  hospital encounter of 12/13/14 (from the past 48 hour(s))  Urinalysis, Routine w reflex microscopic     Status: None   Collection Time: 12/13/14  8:50 AM  Result Value Ref Range   Color, Urine YELLOW YELLOW   APPearance CLEAR CLEAR   Specific Gravity, Urine 1.025 1.005 - 1.030   pH 5.5 5.0 - 8.0   Glucose, UA NEGATIVE NEGATIVE mg/dL   Hgb urine dipstick NEGATIVE NEGATIVE   Bilirubin Urine NEGATIVE NEGATIVE   Ketones, ur NEGATIVE NEGATIVE mg/dL   Protein, ur NEGATIVE NEGATIVE mg/dL   Urobilinogen, UA 0.2 0.0 - 1.0 mg/dL   Nitrite NEGATIVE NEGATIVE   Leukocytes, UA NEGATIVE NEGATIVE    Comment: MICROSCOPIC NOT DONE ON URINES WITH NEGATIVE PROTEIN, BLOOD, LEUKOCYTES, NITRITE, OR GLUCOSE <1000 mg/dL.  Pregnancy, urine POC     Status: None   Collection Time: 12/13/14  8:57 AM  Result Value Ref Range   Preg Test, Ur NEGATIVE NEGATIVE    Comment:        THE SENSITIVITY OF THIS METHODOLOGY IS >24 mIU/mL   CBC     Status: None   Collection Time: 12/13/14  9:40 AM  Result Value Ref Range   WBC 8.5 4.0 - 10.5 K/uL   RBC 4.77 3.87 - 5.11 MIL/uL   Hemoglobin 13.4 12.0 - 15.0 g/dL   HCT 39.7 36.0 - 46.0 %   MCV 83.2 78.0 - 100.0 fL   MCH 28.1 26.0 - 34.0 pg   MCHC 33.8 30.0 - 36.0 g/dL   RDW 14.1 11.5 - 15.5 %   Platelets 275 150 - 400 K/uL  Wet prep, genital     Status: Abnormal   Collection Time: 12/13/14  9:55 AM  Result Value Ref Range   Yeast Wet Prep HPF POC NONE SEEN NONE SEEN   Trich, Wet Prep NONE SEEN NONE SEEN   Clue Cells Wet Prep HPF POC FEW (A) NONE SEEN   WBC, Wet Prep HPF POC FEW (A) NONE SEEN    Comment: FEW BACTERIA SEEN     Review of Systems  Constitutional: Negative for fever, chills and malaise/fatigue.  HENT: Negative for congestion, nosebleeds and sore throat.   Respiratory: Negative for cough, shortness of breath and wheezing.   Cardiovascular: Negative for chest pain, palpitations, claudication and leg swelling.  Gastrointestinal: Positive for abdominal  pain. Negative for nausea, vomiting, diarrhea, constipation and blood in stool.  Genitourinary: Negative for dysuria, urgency, frequency, hematuria and flank pain.   Physical Exam   Blood pressure 150/85, pulse 84, temperature 98.5 F (36.9 C), resp. rate 18, height 4\' 11"  (1.499 m), weight 84.369 kg (186 lb), last menstrual period 12/06/2014.  Physical Exam  Nursing  note and vitals reviewed. Constitutional: She is oriented to person, place, and time. She appears well-developed and well-nourished. No distress.  HENT:  Head: Normocephalic and atraumatic.  Eyes: Right eye exhibits no discharge. Left eye exhibits no discharge.  Neck: Normal range of motion. Neck supple. No thyromegaly present.  Cardiovascular: Normal rate, regular rhythm, normal heart sounds and intact distal pulses.  Exam reveals no gallop and no friction rub.   No murmur heard. Respiratory: Effort normal and breath sounds normal. No stridor. No respiratory distress. She has no wheezes. She has no rales. She exhibits no tenderness.  GI: Soft. Bowel sounds are normal. She exhibits no distension and no mass. There is tenderness (in left lower quadrant). There is no rebound and no guarding.  Genitourinary:  Patient was unable to relax for pelvic exam and refused the exam. Outer labia appeared normal without lesions.  Wet prep and GC swabs collected by RN  Musculoskeletal: She exhibits no edema or tenderness.  Lymphadenopathy:    She has no cervical adenopathy.  Neurological: She is alert and oriented to person, place, and time.  Skin: Skin is warm and dry. No rash noted. She is not diaphoretic. No erythema. No pallor.    MAU Course  Procedures  None  MDM:  Ordering HIV, CBC, Urinalysis, Urine Pregnancy. Patient refused pelvic exam. Will order GC probes.  800mg  of Ibuprofen given to patient and she reports the abdominal pain improved.   Treatment of BV explained to the patient and she was informed it is not medically  necessary to treat unless symptomatic. She prefers treatment but declines PO Flagyl due to nausea side effect. Rx given for vaginal Metrogel and explained to the patient. She requests Diflucan if prescribed this medication due to personal history of vaginal suppositories causing yeast infections. Patient upset that metrogel may be higher in cost and I voiced that I was unsure whether it was covered by medicaid. I informed patient that she could call me if metrogel was not covered and we could discuss other options, however I did voice that flagyl was going to be the cheapest/ recommended treatment choice.    Assessment and Plan   A: Abdominal Pain P:  -Will obtain pelvic ultrasound outpatient.  -Referral made to clinic. Clinic will call patient after ultrasound is done.  -Rx given for 800mg  Ibuprofen.  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    Lezlie Lye, NP 12/13/2014 12:04 PM

## 2014-12-13 NOTE — MAU Note (Signed)
Pt presents to MAU with complaints of pelvic pain, states that she was evaluated at [facility] recently and was told she had a mass on her cervix. Was also treated for BV but was unable to keep medications down.

## 2014-12-14 LAB — GC/CHLAMYDIA PROBE AMP (~~LOC~~) NOT AT ARMC
Chlamydia: NEGATIVE
NEISSERIA GONORRHEA: NEGATIVE

## 2014-12-14 LAB — HIV ANTIBODY (ROUTINE TESTING W REFLEX): HIV Screen 4th Generation wRfx: NONREACTIVE

## 2014-12-15 ENCOUNTER — Other Ambulatory Visit (HOSPITAL_COMMUNITY): Payer: Self-pay | Admitting: Obstetrics and Gynecology

## 2014-12-15 ENCOUNTER — Ambulatory Visit (HOSPITAL_COMMUNITY)
Admission: RE | Admit: 2014-12-15 | Discharge: 2014-12-15 | Disposition: A | Discharge status: Home / Self Care | Payer: Medicaid Other | Source: Ambulatory Visit | Attending: Obstetrics and Gynecology | Admitting: Obstetrics and Gynecology

## 2014-12-15 DIAGNOSIS — N8320 Unspecified ovarian cysts: Secondary | ICD-10-CM | POA: Diagnosis not present

## 2014-12-15 DIAGNOSIS — R102 Pelvic and perineal pain: Secondary | ICD-10-CM

## 2014-12-19 ENCOUNTER — Emergency Department (HOSPITAL_COMMUNITY)
Admission: EM | Admit: 2014-12-19 | Discharge: 2014-12-20 | Disposition: A | Discharge status: Home / Self Care | Payer: Medicaid Other | Attending: Emergency Medicine | Admitting: Emergency Medicine

## 2014-12-19 ENCOUNTER — Encounter (HOSPITAL_COMMUNITY): Payer: Self-pay | Admitting: *Deleted

## 2014-12-19 DIAGNOSIS — F332 Major depressive disorder, recurrent severe without psychotic features: Secondary | ICD-10-CM | POA: Diagnosis not present

## 2014-12-19 DIAGNOSIS — F329 Major depressive disorder, single episode, unspecified: Secondary | ICD-10-CM

## 2014-12-19 DIAGNOSIS — J45909 Unspecified asthma, uncomplicated: Secondary | ICD-10-CM | POA: Insufficient documentation

## 2014-12-19 DIAGNOSIS — R45851 Suicidal ideations: Secondary | ICD-10-CM | POA: Diagnosis not present

## 2014-12-19 DIAGNOSIS — I10 Essential (primary) hypertension: Secondary | ICD-10-CM | POA: Insufficient documentation

## 2014-12-19 DIAGNOSIS — Z79899 Other long term (current) drug therapy: Secondary | ICD-10-CM | POA: Insufficient documentation

## 2014-12-19 DIAGNOSIS — F1910 Other psychoactive substance abuse, uncomplicated: Secondary | ICD-10-CM | POA: Diagnosis not present

## 2014-12-19 DIAGNOSIS — Z720 Tobacco use: Secondary | ICD-10-CM | POA: Insufficient documentation

## 2014-12-19 DIAGNOSIS — F32A Depression, unspecified: Secondary | ICD-10-CM

## 2014-12-19 DIAGNOSIS — Z008 Encounter for other general examination: Secondary | ICD-10-CM | POA: Diagnosis present

## 2014-12-19 LAB — COMPREHENSIVE METABOLIC PANEL
ALT: 24 U/L (ref 0–35)
AST: 25 U/L (ref 0–37)
Albumin: 3.8 g/dL (ref 3.5–5.2)
Alkaline Phosphatase: 75 U/L (ref 39–117)
Anion gap: 11 (ref 5–15)
BUN: 9 mg/dL (ref 6–23)
CO2: 23 mmol/L (ref 19–32)
Calcium: 9 mg/dL (ref 8.4–10.5)
Chloride: 103 mmol/L (ref 96–112)
Creatinine, Ser: 0.97 mg/dL (ref 0.50–1.10)
GFR calc Af Amer: >90 mL/min (ref >90)
GFR calc non Af Amer: 79 mL/min — ABNORMAL LOW (ref >90)
Glucose, Bld: 81 mg/dL (ref 70–99)
Potassium: 3.4 mmol/L — ABNORMAL LOW (ref 3.5–5.1)
Sodium: 137 mmol/L (ref 135–145)
Total Bilirubin: 0.6 mg/dL (ref 0.3–1.2)
Total Protein: 7.1 g/dL (ref 6.0–8.3)

## 2014-12-19 LAB — RAPID URINE DRUG SCREEN, HOSP PERFORMED
Amphetamines: NOT DETECTED
Barbiturates: NOT DETECTED
Benzodiazepines: NOT DETECTED
Cocaine: NOT DETECTED
Opiates: NOT DETECTED
Tetrahydrocannabinol: POSITIVE — AB

## 2014-12-19 LAB — POC URINE PREG, ED: Preg Test, Ur: NEGATIVE

## 2014-12-19 LAB — CBC
HCT: 39.4 % (ref 36.0–46.0)
Hemoglobin: 12.8 g/dL (ref 12.0–15.0)
MCH: 26.9 pg (ref 26.0–34.0)
MCHC: 32.5 g/dL (ref 30.0–36.0)
MCV: 82.8 fL (ref 78.0–100.0)
Platelets: 289 10*3/uL (ref 150–400)
RBC: 4.76 MIL/uL (ref 3.87–5.11)
RDW: 14 % (ref 11.5–15.5)
WBC: 7.8 10*3/uL (ref 4.0–10.5)

## 2014-12-19 LAB — ETHANOL: Alcohol, Ethyl (B): <5 mg/dL (ref 0–9)

## 2014-12-19 LAB — ACETAMINOPHEN LEVEL: Acetaminophen (Tylenol), Serum: <10 ug/mL — ABNORMAL LOW (ref 10–30)

## 2014-12-19 LAB — SALICYLATE LEVEL: Salicylate Lvl: <4 mg/dL (ref 2.8–20.0)

## 2014-12-19 MED ORDER — HYDROCODONE-ACETAMINOPHEN 5-325 MG PO TABS
1.0000 | ORAL_TABLET | Freq: Once | ORAL | Status: AC
  Administered 2014-12-19: 1 via ORAL
  Filled 2014-12-19: qty 1

## 2014-12-19 MED ORDER — DIAZEPAM 2 MG PO TABS
2.0000 mg | ORAL_TABLET | Freq: Once | ORAL | Status: AC
  Administered 2014-12-19: 2 mg via ORAL
  Filled 2014-12-19: qty 1

## 2014-12-19 NOTE — ED Notes (Signed)
The pt  Is changing into cranberry scrubs.  Staffing office  Contacted for a sitter need.  Chg notified.  Security called to wand the pt.  Clothes and valuables   Placed in bags

## 2014-12-19 NOTE — ED Notes (Signed)
She is noiw saying that she needs to be here because she is afraid that she will hurt herself

## 2014-12-19 NOTE — ED Notes (Signed)
Bad thoughts today and she does not hurt herself.  She dioes nit want to be by herself  She does noit have a good grasp on reality. Hx of the same  lmpo April 4th.  Tearful in triuage

## 2014-12-19 NOTE — BH Assessment (Addendum)
Tele Assessment Note   Samantha Yoder is an 29 y.o. female that is self-referred to [facility] reporting SI with several plans to drive off of a cliff or bridge, or "to jump in a pond because I can't swim."  Pt did not want to elaborate on any precipitating factors that led her to feel this way, stating she didn't want to talk about it.  Pt stated she has a longstanding hx of depression, suicide attempt at age 82, and has seen several outpatient providers and tried several antidepressants over the years.  Pt also has hx of cutting, but reports has not cut herself in 2 years.  Pt denies HI or AVH.  No delusions noted.  However, pt states, "I am not in the right state of mind," and "I feel confused."  Pt stated she left her job in [place] to stay home and her boyfriend would work.  Pt stated her depressive sx have worsened since she quit her job over the last 2 mos.  Pt stated she doesn't care about life and only showers because she is afraid of germs.  Pt has not been caring for herself by report.  Pt admits to drinking 3 shots of alcohol 3-4 x/month.  Pt reports panic attacks.  Pt has a 27 yr old son that also lives with her.  Pt reports she has not other support system.  Inpatient treatment is recommended at this time.  Pt is in agreement with this.  Consulted with Catalina Pizza, NP at [phone] who recommends inpatient treatment.  Called EDP Joya Gaskins who is also in agreement with pt disposition.  TTS to seek placement for the pt.  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 Hypertension   . Asthma   . Deliberate self-cutting   . Epistaxis    Axis IV: occupational problems, other psychosocial or environmental problems, problems related to social environment and problems with primary support group Axis V: 21-30 behavior considerably influenced by delusions or hallucinations OR serious impairment in judgment, communication OR  inability to function in almost all areas  Past Medical History:  Past Medical History  Diagnosis Date  . Hypertension   . Asthma   . Deliberate self-cutting   . Epistaxis     Past Surgical History  Procedure Laterality Date  . Cholecystectomy      Family History:  Family History  Problem Relation Age of Onset  . COPD Mother   . Depression Mother   . Hypertension Mother   . Hypertension Father   . Drug abuse Brother   . Cancer Maternal Aunt   . Hyperlipidemia Maternal Uncle   . Miscarriages / Stillbirths Maternal Uncle   . Diabetes Maternal Grandmother   . Miscarriages / Stillbirths Paternal Grandmother     Social History:  reports that she has been smoking Cigarettes.  She has a 7.5 pack-year smoking history. She does not have any smokeless tobacco history on file. She reports that she drinks alcohol. She reports that she does not use illicit drugs.  Additional Social History:  Alcohol / Drug Use Pain Medications: see med list Prescriptions: see med list Over the Counter: see med list History of alcohol / drug use?: Yes Longest period of sobriety (when/how long): na Negative Consequences of Use:  (na) Withdrawal Symptoms:  (na) Substance #1 Name of Substance 1: Alcohol 1 - Age of First Use: 6 1 - Amount (size/oz): "a  few shots" 1 - Frequency: 3-4 x/month 1 - Duration: ongoing 1 - Last Use / Amount: last night - "a few shots"  CIWA: CIWA-Ar BP: 139/80 mmHg Pulse Rate: 97 COWS:    PATIENT STRENGTHS: (choose at least two) Ability for insight Average or above average intelligence Capable of independent living Communication skills General fund of knowledge Motivation for treatment/growth Work skills  Allergies:  Allergies  Allergen Reactions  . Other Itching    clorox makes her "itch really bad". Allergy to "fresh roses" also.     Home Medications:  (Not in a hospital admission)  OB/GYN Status:  Patient's last menstrual period was  12/06/2014.  General Assessment Data Location of Assessment: [facility] ED Is this a Tele or Face-to-Face Assessment?: Tele Assessment Is this an Initial Assessment or a Re-assessment for this encounter?: Initial Assessment Living Arrangements: Spouse/significant other, Children Can pt return to current living arrangement?: Yes Admission Status: Voluntary Is patient capable of signing voluntary admission?: Yes Transfer from: [facility] Referral Source: Self/Family/Friend     [place] Living Arrangements: Spouse/significant other, Children Name of Psychiatrist: none Name of Therapist: none  Education Status Is patient currently in school?: No Current Grade: NA Highest grade of school patient has completed: [employer] Degree Name of school: Unknown  Risk to self with the past 6 months Suicidal Ideation: Yes-Currently Present Suicidal Intent: Yes-Currently Present Is patient at risk for suicide?: Yes Suicidal Plan?: Yes-Currently Present Specify Current Suicidal Plan: to drive off of a bridge or cliff, or jump in a pond because she can't swim Access to Means: Yes Specify Access to Suicidal Means: can walk and has access to a car What has been your use of drugs/alcohol within the last 12 months?: pt reports occ alcohol use Previous Attempts/Gestures: Yes How many times?: 1 (age 69 - tried to overdose on Tylenol) Other Self Harm Risks: pt denies - has hx cutting Triggers for Past Attempts: Other (Comment) (Depression) Intentional Self Injurious Behavior: None Family Suicide History: Yes (d[place] cousin) Recent stressful life event(s): Conflict (Comment), Other (Comment) (Pt stated she didn't want to talk about it, but admits to ) Persecutory voices/beliefs?: No Depression: Yes Depression Symptoms: Despondent, Tearfulness, Isolating, Fatigue, Loss of interest in usual pleasures, Feeling worthless/self pity, Feeling angry/irritable Substance abuse history and/or  treatment for substance abuse?: No Suicide prevention information given to non-admitted patients: Not applicable  Risk to Others within the past 6 months Homicidal Ideation: No Thoughts of Harm to Others: No Current Homicidal Intent: No Current Homicidal Plan: No Access to Homicidal Means: No Identified Victim: na - pt denies History of harm to others?: No Assessment of Violence: None Noted Violent Behavior Description: na - pt calm, cooperative Does patient have access to weapons?: No Criminal Charges Pending?: No Does patient have a court date: No  Psychosis Hallucinations: None noted Delusions: None noted  Mental Status Report Appearance/Hygiene: Disheveled, In scrubs Eye Contact: Good Motor Activity: Freedom of movement, Unremarkable Speech: Logical/coherent Level of Consciousness: Alert, Crying Mood: Depressed Affect: Appropriate to circumstance Anxiety Level: Panic Attacks Panic attack frequency: varies Most recent panic attack: today Thought Processes: Coherent, Relevant (pt reports she is confused, however) Judgement: Impaired Orientation: Person, Place, Time, Situation Obsessive Compulsive Thoughts/Behaviors: None  Cognitive Functioning Concentration: Normal Memory: Recent Intact, Remote Intact IQ: Average Insight: Fair Impulse Control: Fair Appetite: Poor Weight Loss:  (unknown) Weight Gain:  (unknown) Sleep: Increased Total Hours of Sleep:  (reports wants to sleep all day) Vegetative Symptoms: Decreased grooming,  Staying in bed  ADLScreening [facility] Assessment Services) Patient's cognitive ability adequate to safely complete daily activities?: Yes Patient able to express need for assistance with ADLs?: Yes Independently performs ADLs?: Yes (appropriate for developmental age)  Prior Inpatient Therapy Prior Inpatient Therapy: No Prior Therapy Dates: na Prior Therapy Facilty/Provider(s): na Reason for Treatment: na  Prior Outpatient Therapy Prior  Outpatient Therapy: Yes Prior Therapy Dates: ongoing (none currently) Prior Therapy Facilty/Provider(s): several providers Reason for Treatment: med mgnt  ADL Screening (condition at time of admission) Patient's cognitive ability adequate to safely complete daily activities?: Yes Is the patient deaf or have difficulty hearing?: No Does the patient have difficulty seeing, even when wearing glasses/contacts?: No Does the patient have difficulty concentrating, remembering, or making decisions?: No Patient able to express need for assistance with ADLs?: Yes Does the patient have difficulty dressing or bathing?: No Independently performs ADLs?: Yes (appropriate for developmental age) Does the patient have difficulty walking or climbing stairs?: No  Home Assistive Devices/Equipment Home Assistive Devices/Equipment: None    Abuse/Neglect Assessment (Assessment to be complete while patient is alone) Physical Abuse: Denies Verbal Abuse: Denies Sexual Abuse: Denies Exploitation of patient/patient's resources: Denies Self-Neglect: Denies Values / Beliefs Cultural Requests During Hospitalization: None Spiritual Requests During Hospitalization: None Consults Spiritual Care Consult Needed: No Social Work Consult Needed: No Regulatory affairs officer (For Healthcare) Does patient have an advance directive?: No Would patient like information on creating an advanced directive?: No - patient declined information    Additional Information 1:1 In Past 12 Months?: No CIRT Risk: No Elopement Risk: No Does patient have medical clearance?: Yes     Disposition:  Disposition Initial Assessment Completed for this Encounter: Yes Disposition of Patient: Referred to, Inpatient treatment program Type of inpatient treatment program: Adult  Shaune Pascal, MS, [facility] Therapeutic Triage Specialist [facility]   12/19/2014 6:28 PM

## 2014-12-19 NOTE — BH Assessment (Signed)
[facility] Assessment Progress Note   Called and scheduled pt's tele assessment with this clinician.  Also, gathered clinical information on pt from [place].  Shaune Pascal, MS, [facility] Therapeutic Triage Specialist [facility]

## 2014-12-19 NOTE — ED Provider Notes (Signed)
CSN: [id number]     Arrival date & time 12/19/14  1602 History   First MD Initiated Contact with Patient 12/19/14 1651     Chief Complaint  Patient presents with  . Psychiatric Evaluation   (Consider location/radiation/quality/duration/timing/severity/associated sxs/prior Treatment)  Patient is a 29 y.o. female presenting with mental health disorder. The history is provided by the patient. No language interpreter was used.  Mental Health Problem Presenting symptoms: depression, suicidal thoughts and suicidal threats   Degree of incapacity (severity):  Moderate Onset quality:  Gradual Timing:  Constant Progression:  Worsening Chronicity:  Recurrent Context: stressful life event   Treatment compliance:  Some of the time Relieved by:  Nothing Worsened by:  Family interactions Ineffective treatments:  None tried Associated symptoms: anxiety and feelings of worthlessness   Associated symptoms: no abdominal pain, no chest pain, no fatigue, no headaches, no irritability and no poor judgment   Risk factors: hx of mental illness     Past Medical History  Diagnosis Date  . Hypertension   . Asthma   . Deliberate self-cutting   . Epistaxis    Past Surgical History  Procedure Laterality Date  . Cholecystectomy     Family History  Problem Relation Age of Onset  . COPD Mother   . Depression Mother   . Hypertension Mother   . Hypertension Father   . Drug abuse Brother   . Cancer Maternal Aunt   . Hyperlipidemia Maternal Uncle   . Miscarriages / Stillbirths Maternal Uncle   . Diabetes Maternal Grandmother   . Miscarriages / Stillbirths Paternal Grandmother    History  Substance Use Topics  . Smoking status: Current Every Day Smoker -- 0.50 packs/day for 15 years    Types: Cigarettes  . Smokeless tobacco: Not on file  . Alcohol Use: Yes     Comment: Social drinker    OB History    Gravida Para Term Preterm AB TAB SAB Ectopic Multiple Living   1 1  1      1      Review of  Systems  Constitutional: Negative for irritability and fatigue.  Respiratory: Negative for cough, chest tightness and shortness of breath.   Cardiovascular: Negative for chest pain.  Gastrointestinal: Negative for nausea, vomiting and abdominal pain.  Neurological: Negative for weakness, light-headedness and headaches.  Psychiatric/Behavioral: Positive for suicidal ideas. Negative for confusion. The patient is nervous/anxious.   All other systems reviewed and are negative.   Allergies  Other  Home Medications   Prior to Admission medications   Medication Sig Start Date End Date Taking? Authorizing Provider  fluconazole (DIFLUCAN) 150 MG tablet Take 1 tablet (150 mg total) by mouth once. Patient not taking: Reported on 12/19/2014 12/13/14   Lezlie Lye, NP  ibuprofen (ADVIL,MOTRIN) 800 MG tablet Take 1 tablet (800 mg total) by mouth 3 (three) times daily. Patient not taking: Reported on 12/19/2014 12/13/14   Lezlie Lye, NP  metroNIDAZOLE (METROGEL VAGINAL) 0.75 % vaginal gel Place 1 Applicatorful vaginally 2 (two) times daily. Patient not taking: Reported on 12/19/2014 12/13/14   Lezlie Lye, NP   BP 139/80 mmHg  Pulse 97  Temp(Src) 98.7 F (37.1 C) (Oral)  Resp 20  Ht 4\' 11"  (1.499 m)  Wt 186 lb (84.369 kg)  BMI 37.55 kg/m2  SpO2 97%  LMP 12/06/2014   Physical Exam  Constitutional: Vital signs are normal. She appears well-developed and well-nourished. She does not appear ill. No distress.  HENT:  Head:  Normocephalic and atraumatic.  Nose: Nose normal.  Mouth/Throat: Oropharynx is clear and moist. No oropharyngeal exudate.  Eyes: EOM are normal. Pupils are equal, round, and reactive to light.  Neck: Normal range of motion. Neck supple.  Cardiovascular: Normal rate, regular rhythm, normal heart sounds and intact distal pulses.   No murmur heard. Pulmonary/Chest: Effort normal and breath sounds normal. No respiratory distress. She has no wheezes. She exhibits no  tenderness.  Abdominal: Soft. There is no tenderness. There is no rebound and no guarding.  Musculoskeletal: Normal range of motion. She exhibits no tenderness.  Lymphadenopathy:    She has no cervical adenopathy.  Neurological: She is alert. No cranial nerve deficit. Coordination normal.  Skin: Skin is warm and dry. She is not diaphoretic.  Psychiatric: Her speech is normal and behavior is normal. Judgment normal. Her mood appears anxious. Cognition and memory are normal. She exhibits a depressed mood. She expresses suicidal ideation. She expresses no homicidal ideation. She expresses no suicidal plans.  Nursing note and vitals reviewed.   ED Course  Procedures (including critical care time) Labs Review Labs Reviewed  ACETAMINOPHEN LEVEL - Abnormal; Notable for the following:    Acetaminophen (Tylenol), Serum <10.0 (*)    All other components within normal limits  COMPREHENSIVE METABOLIC PANEL - Abnormal; Notable for the following:    Potassium 3.4 (*)    GFR calc non Af Amer 79 (*)    All other components within normal limits  URINE RAPID DRUG SCREEN (HOSP PERFORMED) - Abnormal; Notable for the following:    Tetrahydrocannabinol POSITIVE (*)    All other components within normal limits  CBC  ETHANOL  SALICYLATE LEVEL  POC URINE PREG, ED   Imaging Review No results found.   EKG Interpretation None      MDM   Final diagnoses:  Depression  Suicide ideation   Pt is a 29 yo F with hx of depression who presents with increasing depression and passive SI.  Denies plan for SI, but states she wished she "wouldn't go to hell if she killed herself, or she for sure would."  Endorses multiple stressors and increasing anxiety about them. Left lateral neck muscle strain and headache, she believes to be related to her increased stress.  Will give her valium for muscle strain/anxiety.     Endorses SI.  No specific HI, but "hates the world right now".  Denies A/V hallucinations.  Denies  recent drug use or EtOH.   Sitter at bedside Psych labs ordered.   TTS psych team called to discuss patient's case at 1730.    Given one dose of norco due to continued neck pain.   1835: TTS team agrees that patient would benefit from inpatient treatment.  Will try to find her a bed for inpatient treatment for depression/SI.  Appreciate their help with this patient.   Unfortunately there are no inpatient beds available at this facility.  Psych team to continue looking for placement.  Diet ordered.  Patient has no home meds.    Patient's care was handed off to ongoing ED provider.  Please see their note for further ED dispo.     Patient was seen with ED Attending, Dr. Joette Catching, MD   Tori Milks, MD 12/20/14 0111  Virgel Manifold, MD 12/22/14 [phone]

## 2014-12-20 DIAGNOSIS — F1910 Other psychoactive substance abuse, uncomplicated: Secondary | ICD-10-CM | POA: Diagnosis not present

## 2014-12-20 DIAGNOSIS — F332 Major depressive disorder, recurrent severe without psychotic features: Secondary | ICD-10-CM | POA: Diagnosis present

## 2014-12-20 DIAGNOSIS — R45851 Suicidal ideations: Secondary | ICD-10-CM

## 2014-12-20 MED ORDER — IBUPROFEN 800 MG PO TABS
800.0000 mg | ORAL_TABLET | Freq: Four times a day (QID) | ORAL | Status: DC | PRN
  Administered 2014-12-20: 800 mg via ORAL
  Filled 2014-12-20: qty 1

## 2014-12-20 MED ORDER — LAMOTRIGINE 25 MG PO TABS
25.0000 mg | ORAL_TABLET | Freq: Two times a day (BID) | ORAL | Status: DC
  Administered 2014-12-20: 25 mg via ORAL
  Filled 2014-12-20 (×2): qty 1

## 2014-12-20 MED ORDER — CYCLOBENZAPRINE HCL 10 MG PO TABS
5.0000 mg | ORAL_TABLET | Freq: Three times a day (TID) | ORAL | Status: DC | PRN
  Administered 2014-12-20: 5 mg via ORAL
  Filled 2014-12-20: qty 1

## 2014-12-20 MED ORDER — LAMOTRIGINE 25 MG PO TABS: 25.0000 mg | ORAL_TABLET | Freq: Two times a day (BID) | ORAL | Status: DC

## 2014-12-20 NOTE — Progress Notes (Signed)
Patient has been seen by Psychiatrist and cleared to discharge home. LCSW working to set up outpatient services for patient with medications and therapy. Patient currently has medicaid and it is active in [facility]   LCSW gave the following resources:  List of primary care providers in the [facility] in effort to establish consistent care. [facility] the [employer] crisis information And referral to [employer] (awaiting call back for referral) patient aware of this pending referral, updated information for LCSW to complete and agency to call.  Patient has a ride whom she is calling to pick her up. No other issues at this time.  Lane Hacker, MSW Clinical Social Work: Emergency Room [phone]

## 2014-12-20 NOTE — Discharge Instructions (Signed)
Take your medicines as prescribed.   Take motrin for shoulder pain.   Follow up with a psychiatrist.   Return to ER if you have thoughts of harming yourself or others, hallucinations.    Emergency Department Resource Guide 1) Find a Doctor and Pay Out of Pocket Although you won't have to find out who is covered by your insurance plan, it is a good idea to ask around and get recommendations. You will then need to call the office and see if the doctor you have chosen will accept you as a new patient and what types of options they offer for patients who are self-pay. Some doctors offer discounts or will set up payment plans for their patients who do not have insurance, but you will need to ask so you aren't surprised when you get to your appointment.  2) Contact Your Local Health Department Not all health departments have doctors that can see patients for sick visits, but many do, so it is worth a call to see if yours does. If you don't know where your local health department is, you can check in your phone book. The CDC also has a tool to help you locate your state's health department, and many state websites also have listings of all of their local health departments.  3) Find a [facility] If your illness is not likely to be very severe or complicated, you may want to try a walk in clinic. These are popping up all over the country in pharmacies, drugstores, and shopping centers. They're usually staffed by nurse practitioners or physician assistants that have been trained to treat common illnesses and complaints. They're usually fairly quick and inexpensive. However, if you have serious medical issues or chronic medical problems, these are probably not your best option.  No Primary Care Doctor: - Call Health Connect at  [phone] - they can help you locate a primary care doctor that  accepts your insurance, provides certain services, etc. - Physician Referral Service- [phone]  Chronic  Pain Problems: Organization         Address  Phone   Notes  [facility]  [phone] Patients need to be referred by their primary care doctor.   Medication Assistance: Organization         Address  Phone   Notes  [facility] [address] [place], [place] [address] [phone] --Must be a resident of [facility] -- Must have NO insurance coverage whatsoever (no Medicaid/ Medicare, etc.) -- The pt. MUST have a primary care doctor that directs their care regularly and follows them in the community   Med[facility]ist  [phone]   [employer]  [phone]    Agencies that provide inexpensive medical care: Organization         Address  Phone   Notes  [place]  [phone]   [facility]    [phone]   [facility] [place], [place] [address] [phone]   [facility] [address] [place] [phone]   Planned Parenthood    [phone]   [facility]    [phone]   [place] and [place] [address], [place] Phone:  [phone], Fax:  [phone] Hours of Operation:  9 am - 6 pm, M-F.  Also accepts Medicaid/Medicare and self-pay.  [facility]  [address], [place] Phone: [phone], Fax: [phone]. Hours of Operation:  8:30 am - 5:30 pm, M-F.  Also accepts Medicaid and self-pay.  [facility] [place] [address] [place] Phone: [phone]   [place], [place], [place] [phone], Ext. [phone] Mondays & Thursdays: 7-9 AM.  First 15 patients are seen on a first come, first serve basis.    [place] Providers:  Organization         Address  Phone   Notes  [facility] [address], [place] [phone] Also  accepts self-pay patients.  [facility] [address], [place]  [phone]   [place], [address], [place] [phone]   [facility] [address] [place] [phone]   Lucianne Lei [address], [place]   [phone] Only accepts [place] Access [place] patients after they have their name applied to their card.   Self-Pay (no insurance) in [facility]:  Organization         Address  Phone   Notes  Sickle Cell Patients, [facility] [place] [phone]   [facility] [place] [phone]   [facility] [place]  [place], [place], [place] [phone]   [facility]/Dr. Osei-Bonsu  [address], [place] or [address], [place] [phone] Phone number for both [place] and [place] locations is the same.  [facility] [address] [place] [phone]   [facility] [address] [place] or [address] [phone]   [facility] [address] [place] [phone], phone; [phone], fax Sees patients 1st and 3rd Saturday of every month.  Must not qualify for public or private insurance (i.e. Medicaid, Medicare, [place] Health Choice, Veterans' Benefits)  Household income should be no more than 200% of the poverty level The clinic cannot treat you if you are pregnant or think you are pregnant  Sexually transmitted diseases are not treated at the clinic.    Dental Care: Organization         Address  Phone  Notes  [facility] Department of [facility] [phone] Accepts children up to age 64 who are enrolled in [place] or [place]; pregnant  women with a Medicaid card; and children who have applied for Medicaid or [place] Health Choice, but were declined, whose parents can pay a reduced fee at time of service.  [facility] Department of [facility]  [address], [place] [phone] Accepts children up to age 74 who are enrolled in [place] or [place]; pregnant women with a Medicaid card; and children who have applied for Medicaid or [place] Health Choice, but were declined, whose parents can pay a reduced fee at time of service.  [place] Adult Dental Access PROGRAM  [place] [phone] Patients are seen by appointment only. Walk-ins are not accepted. [place] will see patients 14 years of age and older. Monday - Tuesday (8am-5pm) Most Wednesdays (8:30-5pm) $30 per visit, cash only  [place] Adult [facility] Access [facility]  [address], [facility] [phone] Patients are seen by appointment only. Walk-ins are not accepted. [place] will see patients 29 years of age and older. One Wednesday Evening (Monthly: Volunteer Based).  $30 per visit, cash only  [place]  [phone] for adults; Children under age 108, call Graduate Pediatric Dentistry at [phone]. Children aged 52-14, please call [phone] to request a pediatric application.  Dental services are provided in all areas of dental care including fillings, crowns and bridges, complete and partial dentures, implants, gum treatment, root canals, and extractions. Preventive care is also provided. Treatment is provided to both adults and children. Patients are selected via a lottery and there is often a waiting list.   [facility] [address] [place]  [phone] [email or web address]   [facility] [address] [place], [place] [phone], Ext. [phone] Second and Fourth Thursday of each month, opens at 6:30 AM; Clinic ends at 9 AM.  Patients are  seen on a first-come first-served basis, and a limited number are seen during each clinic.   [facility]  [address] Hillard Danker [place], [place] [phone]   Eligibility Requirements You must have lived in [place], [place], or [place] counties for at least the last three months.   You cannot be eligible for state or federal sponsored [employer], including [employer], [place], or [employer].   You generally cannot be eligible for healthcare insurance through your employer.    How to apply: Eligibility screenings are held every Tuesday and Wednesday afternoon from 1:00 pm until 4:00 pm. You do not need an appointment for the interview!  [facility] [address], [place], [place]   [place]  [place] Department  [place]  [phone]    Behavioral Health Resources in the Community: Intensive Outpatient Programs Organization         Address  Phone  Notes  [place]. [address], [place], [place] [phone]   [facility] Outpatient [address], [place], [place]   ADS: Alcohol [facility] Drug [facility] [address] [place], [place]   [place] [address]  [place], [place] or [phone]   Substance Abuse Resources Organization         Address  Phone  Notes  Alcohol and Drug Services  [phone]   [place]  [phone]   [facility]   Chinita Pester  [phone]   Residential & Outpatient Substance Abuse Program  [phone]   Psychological Services Organization         Address  Phone  Notes  [facility] [place]  [place]  [phone]   [place] [address], [place] [phone] or [phone]    Mobile Crisis  Teams Organization         Address  Phone  Notes  [facility], Mobile Crisis Care Unit  [phone]   [facility] Psychotherapeutic Services  [address] [place], [place]   [place] Levels [address], [place] [phone]    Self-Help/Support Groups Organization         Address  Phone             Notes  [facility] of [place] - variety of support groups  [place] Call for more information  Narcotics Anonymous (NA), [facility] [address], [employer] [place]  2 meetings at this location   Special educational needs teacher         Address  Phone  Notes  [facility] Residential Treatment [place],    [place]  [phone]   [facility]  [address] [place] [address]10, [place], [place]   [place], [place] [phone] Admissions: 8am-3pm M-F  Incentives Substance [place] [address]    [place], [place] [phone]   [facility] [address] [place], [place], [place]   [facility] [address],  [place], [place]   [facility] Programs - Intensive Outpatient [address], [place], [place]   [facility] ([place].) [place].,  [place], [place] [phone] or [phone]   [facility] Services (RTS) [address], [place], [place] Accepts Medicaid  [facility] [place] [address],  [place] [phone] Substance Abuse/Addiction Treatment   [facility] Organization         Address  Phone  Notes  [facility]  [phone]   Domenic Schwab, PhD [address] Arlis Porta [place], [place]   [phone] or [phone]   [place], [place] [phone]   [facility] [address] [place], [place] [phone]  Insurance/Medicaid/sponsorship through [facility] and Families [address]                                    [place], [place] [phone] [place] [address] [place], [place] [phone]    Dr. Adele Schilder  [phone]   [facility] Dept. 1) [address] [place] 2) [place] 3)  [place] [address], [place] [phone]  [phone]   [place] [phone] or [phone] (After Hours)

## 2014-12-20 NOTE — ED Notes (Signed)
Psychiatrist at bedside

## 2014-12-20 NOTE — BHH Counselor (Signed)
Per Lavell Luster, [facility], adult unit is at capacity. Contacted the following facilities for placement:  BED AVAILABLE, FAXED CLINICAL INFORMATION: [employer], per [facility], [place], per [facility], per [facility], per [facility] [employer]  AT CAPACITY: [facility], per Deboraha Sprang, per [employer], per [facility], per [facility], per [facility], per [facility], per [facility], per [facility], per [facility], per [facility], per [facility], per [facility], per [facility], per Herbie Drape, per Claudine Cristal Daxton Nydam, per [facility], per Cherly Hensen, [facility], [facility] Triage Specialist [phone]

## 2014-12-20 NOTE — Progress Notes (Signed)
CSW sought placement at the following facilities:   Faxed referrals to the following facilities: [facility]- per Janace Hoard (waitlist) [facility]- per [place]- per Frazier Butt- per Pamala Hurry, low acuity beds only [facility] (possibility of bed availability)  At capacity: [place]- per Janine Ores- per [facility]- no answer, no voicemail option  Sharren Bridge, MSW, [place] Work, Disposition  12/20/2014 [phone]

## 2014-12-20 NOTE — Consult Note (Signed)
[facility] Face-to-Face Psychiatry Consult   Reason for Consult:  Depression and THC abuse and history of SIB Referring Physician:  EDP Patient Identification: Samantha Yoder MRN:  [id number] Principal Diagnosis: MDD (major depressive disorder), recurrent severe, without psychosis Diagnosis:   Patient Active Problem List   Diagnosis Date Noted  . MDD (major depressive disorder), recurrent severe, without psychosis [F33.2] 12/20/2014    Total Time spent with patient: 1 hour  Subjective:   Samantha Yoder is a 29 y.o. female patient admitted with depression, suicide ideation and THC abuse.  HPI:  Samantha Yoder is an 29 y.o. female seen, chart reviewed for psychiatric consultation and evaluation of depression, anxiety, substance abuse and history of self-injurious behavior. Patient reportedly staying with her boyfriend and 29 years old son. Patient reported she has been argumentative with her boyfriend and her 50 years old son took his side which made her more depressed, anxious and becomes suicidal. Patient reported she has been feeling somewhat better since yesterday and feels safe because she has been not thinking about suicidal or homicidal ideation, intention or plans. Patient stating that she can't care for herself and her family at this time. Patient has a longstanding hx of depression, suicide attempt at age 86, and has seen several outpatient providers and tried several antidepressants over the years.Patient denies history of self-injurious behavior for the last 2 years, she has denied homicidal ideation, auditory and visual hallucinations, delusions and paranoia.Pt stated she left her job in [place] to stay home and her boyfriend would work. Pt stated her depressive sx have worsened since she quit her job over the last 2 mos.patient admits to drinking 3 shots of alcohol 3-4 x/month.Pt has a 84 yr old son that also lives with her.Patient reported she has not patient psychiatric services over several  years and willing to follow up with outpatient psychiatric services and counseling services after discharge from the hospital. Patient contract for safety during this evaluation. Reportedly patient was previously treated with the medication Lamictal which helped her to control her emotions and wishes to start before going to see outpatient psychiatric services.  HPI Elements:    Location:  Depression and anxiety. Quality:  Moderate. Severity:  Suicidal thoughts. Timing:  Argument with boyfriend. Duration:  3 days. Context:  Psychosocial stresses, financial problems, relationship problems..  Past Medical History:  Past Medical History  Diagnosis Date  . Hypertension   . Asthma   . Deliberate self-cutting   . Epistaxis     Past Surgical History  Procedure Laterality Date  . Cholecystectomy     Family History:  Family History  Problem Relation Age of Onset  . COPD Mother   . Depression Mother   . Hypertension Mother   . Hypertension Father   . Drug abuse Brother   . Cancer Maternal Aunt   . Hyperlipidemia Maternal Uncle   . Miscarriages / Stillbirths Maternal Uncle   . Diabetes Maternal Grandmother   . Miscarriages / Stillbirths Paternal Grandmother    Social History:  History  Alcohol Use  . Yes    Comment: Social drinker      History  Drug Use No    History   Social History  . Marital Status: Legally Separated    Spouse Name: N/A  . Number of Children: N/A  . Years of Education: N/A   Social History Main Topics  . Smoking status: Current Every Day Smoker -- 0.50 packs/day for 15 years    Types: Cigarettes  . Smokeless  tobacco: Not on file  . Alcohol Use: Yes     Comment: Social drinker   . Drug Use: No  . Sexual Activity: Yes    Birth Control/ Protection: None   Other Topics Concern  . None   Social History Narrative   ** Merged History Encounter **       Additional Social History:    Pain Medications: see med list Prescriptions: see med  list Over the Counter: see med list History of alcohol / drug use?: Yes Longest period of sobriety (when/how long): na Negative Consequences of Use:  (na) Withdrawal Symptoms:  (na) Name of Substance 1: Alcohol 1 - Age of First Use: 6 1 - Amount (size/oz): "a few shots" 1 - Frequency: 3-4 x/month 1 - Duration: ongoing 1 - Last Use / Amount: last night - "a few shots"                   Allergies:   Allergies  Allergen Reactions  . Other Itching    clorox makes her "itch really bad". Allergy to "fresh roses" also.     Labs:  Results for orders placed or performed during the hospital encounter of 12/19/14 (from the past 48 hour(s))  Acetaminophen level     Status: Abnormal   Collection Time: 12/19/14  4:46 PM  Result Value Ref Range   Acetaminophen (Tylenol), Serum <10.0 (L) 10 - 30 ug/mL    Comment:        THERAPEUTIC CONCENTRATIONS VARY SIGNIFICANTLY. A RANGE OF 10-30 ug/mL MAY BE AN EFFECTIVE CONCENTRATION FOR MANY PATIENTS. HOWEVER, SOME ARE BEST TREATED AT CONCENTRATIONS OUTSIDE THIS RANGE. ACETAMINOPHEN CONCENTRATIONS >150 ug/mL AT 4 HOURS AFTER INGESTION AND >50 ug/mL AT 12 HOURS AFTER INGESTION ARE OFTEN ASSOCIATED WITH TOXIC REACTIONS.   CBC     Status: None   Collection Time: 12/19/14  4:46 PM  Result Value Ref Range   WBC 7.8 4.0 - 10.5 K/uL   RBC 4.76 3.87 - 5.11 MIL/uL   Hemoglobin 12.8 12.0 - 15.0 g/dL   HCT 39.4 36.0 - 46.0 %   MCV 82.8 78.0 - 100.0 fL   MCH 26.9 26.0 - 34.0 pg   MCHC 32.5 30.0 - 36.0 g/dL   RDW 14.0 11.5 - 15.5 %   Platelets 289 150 - 400 K/uL  Comprehensive metabolic panel     Status: Abnormal   Collection Time: 12/19/14  4:46 PM  Result Value Ref Range   Sodium 137 135 - 145 mmol/L   Potassium 3.4 (L) 3.5 - 5.1 mmol/L   Chloride 103 96 - 112 mmol/L   CO2 23 19 - 32 mmol/L   Glucose, Bld 81 70 - 99 mg/dL   BUN 9 6 - 23 mg/dL   Creatinine, Ser 0.97 0.50 - 1.10 mg/dL   Calcium 9.0 8.4 - 10.5 mg/dL   Total Protein 7.1  6.0 - 8.3 g/dL   Albumin 3.8 3.5 - 5.2 g/dL   AST 25 0 - 37 U/L   ALT 24 0 - 35 U/L   Alkaline Phosphatase 75 39 - 117 U/L   Total Bilirubin 0.6 0.3 - 1.2 mg/dL   GFR calc non Af Amer 79 (L) >90 mL/min   GFR calc Af Amer >90 >90 mL/min    Comment: (NOTE) The eGFR has been calculated using the CKD EPI equation. This calculation has not been validated in all clinical situations. eGFR's persistently <90 mL/min signify possible Chronic Kidney Disease.    Anion  gap 11 5 - 15  Ethanol (ETOH)     Status: None   Collection Time: 12/19/14  4:46 PM  Result Value Ref Range   Alcohol, Ethyl (B) <5 0 - 9 mg/dL    Comment:        LOWEST DETECTABLE LIMIT FOR SERUM ALCOHOL IS 11 mg/dL FOR MEDICAL PURPOSES ONLY   Salicylate level     Status: None   Collection Time: 12/19/14  4:46 PM  Result Value Ref Range   Salicylate Lvl <2.6 2.8 - 20.0 mg/dL  Urine Drug Screen     Status: Abnormal   Collection Time: 12/19/14  8:10 PM  Result Value Ref Range   Opiates NONE DETECTED NONE DETECTED   Cocaine NONE DETECTED NONE DETECTED   Benzodiazepines NONE DETECTED NONE DETECTED   Amphetamines NONE DETECTED NONE DETECTED   Tetrahydrocannabinol POSITIVE (A) NONE DETECTED   Barbiturates NONE DETECTED NONE DETECTED    Comment:        DRUG SCREEN FOR MEDICAL PURPOSES ONLY.  IF CONFIRMATION IS NEEDED FOR ANY PURPOSE, NOTIFY LAB WITHIN 5 DAYS.        LOWEST DETECTABLE LIMITS FOR URINE DRUG SCREEN Drug Class       Cutoff (ng/mL) Amphetamine      1000 Barbiturate      200 Benzodiazepine   415 Tricyclics       830 Opiates          300 Cocaine          300 THC              50   POC urine preg, ED (not at [facility])     Status: None   Collection Time: 12/19/14  8:26 PM  Result Value Ref Range   Preg Test, Ur NEGATIVE NEGATIVE    Comment:        THE SENSITIVITY OF THIS METHODOLOGY IS >24 mIU/mL     Vitals: Blood pressure 135/86, pulse 72, temperature 98.3 F (36.8 C), temperature source Oral, resp.  rate 16, height '4\' 11"'  (1.499 m), weight 84.369 kg (186 lb), last menstrual period 12/06/2014, SpO2 100 %.  Risk to Self: Suicidal Ideation: Yes-Currently Present Suicidal Intent: Yes-Currently Present Is patient at risk for suicide?: Yes Suicidal Plan?: Yes-Currently Present Specify Current Suicidal Plan: to drive off of a bridge or cliff, or jump in a pond because she can't swim Access to Means: Yes Specify Access to Suicidal Means: can walk and has access to a car What has been your use of drugs/alcohol within the last 12 months?: pt reports occ alcohol use How many times?: 1 (age 19 - tried to overdose on Tylenol) Other Self Harm Risks: pt denies - has hx cutting Triggers for Past Attempts: Other (Comment) (Depression) Intentional Self Injurious Behavior: None Risk to Others: Homicidal Ideation: No Thoughts of Harm to Others: No Current Homicidal Intent: No Current Homicidal Plan: No Access to Homicidal Means: No Identified Victim: na - pt denies History of harm to others?: No Assessment of Violence: None Noted Violent Behavior Description: na - pt calm, cooperative Does patient have access to weapons?: No Criminal Charges Pending?: No Does patient have a court date: No Prior Inpatient Therapy: Prior Inpatient Therapy: No Prior Therapy Dates: na Prior Therapy Facilty/Provider(s): na Reason for Treatment: na Prior Outpatient Therapy: Prior Outpatient Therapy: Yes Prior Therapy Dates: ongoing (none currently) Prior Therapy Facilty/Provider(s): several providers Reason for Treatment: med mgnt  Current Facility-Administered Medications  Medication Dose Route Frequency Provider  Last Rate Last Dose  . cyclobenzaprine (FLEXERIL) tablet 5 mg  5 mg Oral TID PRN Wandra Arthurs, MD   5 mg at 12/20/14 0925  . ibuprofen (ADVIL,MOTRIN) tablet 800 mg  800 mg Oral Q6H PRN Wandra Arthurs, MD   800 mg at 12/20/14 4034   Current Outpatient Prescriptions  Medication Sig Dispense Refill  .  fluconazole (DIFLUCAN) 150 MG tablet Take 1 tablet (150 mg total) by mouth once. (Patient not taking: Reported on 12/19/2014) 1 tablet 0  . ibuprofen (ADVIL,MOTRIN) 800 MG tablet Take 1 tablet (800 mg total) by mouth 3 (three) times daily. (Patient not taking: Reported on 12/19/2014) 21 tablet 0  . metroNIDAZOLE (METROGEL VAGINAL) 0.75 % vaginal gel Place 1 Applicatorful vaginally 2 (two) times daily. (Patient not taking: Reported on 12/19/2014) 70 g 0    Musculoskeletal: Strength & Muscle Tone: within normal limits Gait & Station: normal Patient leans: N/A  Psychiatric Specialty Exam: Physical Exam Full physical performed in Emergency Department. I have reviewed this assessment and concur with its findings.   ROS depression, anxiety, irritability and augmented to her boyfriend  Blood pressure 135/86, pulse 72, temperature 98.3 F (36.8 C), temperature source Oral, resp. rate 16, height '4\' 11"'  (1.499 m), weight 84.369 kg (186 lb), last menstrual period 12/06/2014, SpO2 100 %.Body mass index is 37.55 kg/(m^2).  General Appearance: Casual  Eye Contact::  Good  Speech:  Clear and Coherent  Volume:  Decreased  Mood:  Anxious and Depressed  Affect:  Appropriate and Congruent  Thought Process:  Coherent and Goal Directed  Orientation:  Full (Time, Place, and Person)  Thought Content:  WDL  Suicidal Thoughts:  No  Homicidal Thoughts:  No  Memory:  Immediate;   Fair Recent;   Fair  Judgement:  Fair  Insight:  Fair  Psychomotor Activity:  Decreased  Concentration:  Good  Recall:  Good  Fund of Knowledge:Good  Language: Good  Akathisia:  Negative  Handed:  Right  AIMS (if indicated):     Assets:  Communication Skills Desire for Improvement Housing Leisure Time Physical Health Resilience Social Support Talents/Skills  ADL's:  Intact  Cognition: WNL  Sleep:      Medical Decision Making: Review of Psycho-Social Stressors (1), Review or order clinical lab tests (1), Established  Problem, Worsening (2), Review of Last Therapy Session (1), Review or order medicine tests (1), Review of Medication Regimen & Side Effects (2) and Review of New Medication or Change in Dosage (2)  Treatment Plan Summary: Daily contact with patient to assess and evaluate symptoms and progress in treatment and Medication management  Plan: Case discussed with the psychiatric social service. May provide lamotrigine 25 mg twice daily for 1 week supply Patient does not meet criteria for psychiatric inpatient admission. Supportive therapy provided about ongoing stressors. Appreciate psychiatric consultation will sign off at this time Please contact [phone] or [phone] if needs further assistance   DispoPatient will be referred to the outpatient psychiatric services and counseling services and medically stable.   Briar Sword,JANARDHAHA R. 12/20/2014 9:45 AM

## 2014-12-20 NOTE — ED Provider Notes (Addendum)
  Physical Exam  BP 135/86 mmHg  Pulse 72  Temp(Src) 98.3 F (36.8 C) (Oral)  Resp 16  Ht 4\' 11"  (1.499 m)  Wt 186 lb (84.369 kg)  BMI 37.55 kg/m2  SpO2 100%  LMP 12/06/2014  Physical Exam  ED Course  Procedures  MDM Patient has L shoulder pain. See yesterday for suicidal ideation and now pending bed at [facility]. Denies trauma. On exam, L trapezius spasms, nl ROM L shoulder and neck. I think likely muscle spasms. Neurovascular intact. She states that norco helped her yesterday but isn't on narcotics at home. I told her that we will try motrin, flexeril. Since she is not on narcotics at home, will not start narcotics.   3:31 PM Psych saw patient. Recommend discharge. Will dc patient home with resources.   Wandra Arthurs, MD 12/20/14 [id number]  Wandra Arthurs, MD 12/20/14 [phone]

## 2015-01-05 ENCOUNTER — Encounter: Payer: Self-pay | Admitting: Obstetrics & Gynecology

## 2015-01-05 ENCOUNTER — Ambulatory Visit (INDEPENDENT_AMBULATORY_CARE_PROVIDER_SITE_OTHER): Payer: Medicaid Other | Admitting: Obstetrics & Gynecology

## 2015-01-05 VITALS — BP 144/85 | HR 84 | Temp 99.4°F | Ht 60.0 in | Wt 180.9 lb

## 2015-01-05 DIAGNOSIS — Z0000 Encounter for general adult medical examination without abnormal findings: Secondary | ICD-10-CM

## 2015-01-05 DIAGNOSIS — R102 Pelvic and perineal pain: Secondary | ICD-10-CM | POA: Diagnosis not present

## 2015-01-05 DIAGNOSIS — N898 Other specified noninflammatory disorders of vagina: Secondary | ICD-10-CM

## 2015-01-05 MED ORDER — MEDROXYPROGESTERONE ACETATE 10 MG PO TABS: 20.0000 mg | ORAL_TABLET | Freq: Every day | ORAL | Status: DC

## 2015-01-05 NOTE — Progress Notes (Signed)
Gave pt info to [facility] for PCP.

## 2015-01-05 NOTE — Patient Instructions (Signed)
Endometriosis Endometriosis is a condition in which the tissue that lines the uterus (endometrium) grows outside of its normal location. The tissue may grow in many locations close to the uterus, but it commonly grows on the ovaries, fallopian tubes, vagina, or bowel. Because the uterus expels, or sheds, its lining every menstrual cycle, there is bleeding wherever the endometrial tissue is located. This can cause pain because blood is irritating to tissues not normally exposed to it.  CAUSES  The cause of endometriosis is not known.  SIGNS AND SYMPTOMS  Often, there are no symptoms. When symptoms are present, they can vary with the location of the displaced tissue. Various symptoms can occur at different times. Although symptoms occur mainly during a woman's menstrual period, they can also occur midcycle and usually stop with menopause. Some people may go months with no symptoms at all. Symptoms may include:   Back or abdominal pain.   Heavier bleeding during periods.   Pain during intercourse.   Painful bowel movements.   Infertility. DIAGNOSIS  Your health care provider will do a physical exam and ask about your symptoms. Various tests may be done, such as:   Blood tests and urine tests. These are done to help rule out other problems.   Ultrasound. This test is done to look for abnormal tissue.   An X-ray of the lower bowel (barium enema).  Laparoscopy. In this procedure, a thin, lighted tube with a tiny camera on the end (laparoscope) is inserted into your abdomen. This helps your health care provider look for abnormal tissue to confirm the diagnosis. The health care provider may also remove a small piece of tissue (biopsy) from any abnormal tissue found. This tissue sample can then be sent to a lab so it can be looked at under a microscope. TREATMENT  Treatment will vary and may include:   Medicines to relieve pain. Nonsteroidal anti-inflammatory drugs (NSAIDs) are a type of  pain medicine that can help to relieve the pain caused by endometriosis.  Hormonal therapy. When using hormonal therapy, periods are eliminated. This eliminates the monthly exposure to blood by the displaced endometrial tissue.   Surgery. Surgery may sometimes be done to remove the abnormal endometrial tissue. In severe cases, surgery may be done to remove the fallopian tubes, uterus, and ovaries (hysterectomy). HOME CARE INSTRUCTIONS   Take all medicines as directed by your health care provider. Do not take aspirin because it may increase bleeding when you are not on hormonal therapy.   Avoid activities that produce pain, including sexual activity. SEEK MEDICAL CARE IF:  You have pelvic pain before, after, or during your periods.  You have pelvic pain between periods that gets worse during your period.  You have pelvic pain during or after sex.  You have pelvic pain with bowel movements or urination, especially during your period.  You have problems getting pregnant.  You have a fever. SEEK IMMEDIATE MEDICAL CARE IF:   Your pain is severe and is not responding to pain medicine.   You have severe nausea and vomiting, or you cannot keep foods down.   You have pain that is limited to the right lower part of your abdomen.   You have swelling or increasing pain in your abdomen.   You see blood in your stool.  MAKE SURE YOU:   Understand these instructions.  Will watch your condition.  Will get help right away if you are not doing well or get worse. Document Released: 08/17/2000 Document  Revised: 01/04/2014 Document Reviewed: 04/17/2013 [facility] Patient Information 2015 [place], [place]. This information is not intended to replace advice given to you by your health care provider. Make sure you discuss any questions you have with your health care provider.

## 2015-01-05 NOTE — Progress Notes (Signed)
Patient ID: Samantha Yoder, female   DOB: 09/01/1986, 29 y.o.   MRN: [id number]  Chief Complaint  Patient presents with  . Follow-up    ovarian cyst; [place] results    HPI Samantha Yoder is a 29 y.o. female.  [id number] Patient's last menstrual period was 12/30/2014.  H/o endometriosis, has had laparoscopy and hormone therapy but only one dose was given. Saw MD in [place] 2 mo ago and had pap HPI  Past Medical History  Diagnosis Date  . Hypertension   . Asthma   . Deliberate self-cutting   . Epistaxis   Bipolar depression  Past Surgical History  Procedure Laterality Date  . Cholecystectomy    Dx laparoscopy  Family History  Problem Relation Age of Onset  . COPD Mother   . Depression Mother   . Hypertension Mother   . Hypertension Father   . Drug abuse Brother   . Cancer Maternal Aunt   . Hyperlipidemia Maternal Uncle   . Miscarriages / Stillbirths Maternal Uncle   . Diabetes Maternal Grandmother   . Miscarriages / Stillbirths Paternal Grandmother     Social History History  Substance Use Topics  . Smoking status: Current Every Day Smoker -- 0.50 packs/day for 15 years    Types: Cigarettes  . Smokeless tobacco: Not on file  . Alcohol Use: Yes     Comment: Social drinker     Allergies  Allergen Reactions  . Other Itching    clorox makes her "itch really bad". Allergy to "fresh roses" also.     Current Outpatient Prescriptions  Medication Sig Dispense Refill  . lamoTRIgine (LAMICTAL) 25 MG tablet Take 1 tablet (25 mg total) by mouth 2 (two) times daily. 60 tablet 0  . fluconazole (DIFLUCAN) 150 MG tablet Take 1 tablet (150 mg total) by mouth once. (Patient not taking: Reported on 12/19/2014) 1 tablet 0  . ibuprofen (ADVIL,MOTRIN) 800 MG tablet Take 1 tablet (800 mg total) by mouth 3 (three) times daily. (Patient not taking: Reported on 12/19/2014) 21 tablet 0  . medroxyPROGESTERone (PROVERA) 10 MG tablet Take 2 tablets (20 mg total) by mouth daily. 30 tablet 5   . metroNIDAZOLE (METROGEL VAGINAL) 0.75 % vaginal gel Place 1 Applicatorful vaginally 2 (two) times daily. (Patient not taking: Reported on 12/19/2014) 70 g 0   No current facility-administered medications for this visit.    Review of Systems Review of Systems  Constitutional: Negative.   Respiratory: Negative.   Genitourinary: Positive for menstrual problem, pelvic pain and dyspareunia. Negative for vaginal discharge.    Blood pressure 144/85, pulse 84, temperature 99.4 F (37.4 C), temperature source Oral, height 5' (1.524 m), weight 180 lb 14.4 oz (82.056 kg), last menstrual period 12/30/2014.  Physical Exam Physical Exam  Constitutional: She is oriented to person, place, and time. She appears well-developed. She appears distressed (some episode of discomfort).  Cardiovascular: Normal rate.   Pulmonary/Chest: Effort normal.  Abdominal: Soft.  Genitourinary: Uterus normal. Vaginal discharge found.  Neurological: She is alert and oriented to person, place, and time.  Skin: Skin is warm and dry.  Psychiatric: She has a normal mood and affect. Her behavior is normal.    Data Reviewed CLINICAL DATA: Pelvic pain in female.  EXAM: TRANSABDOMINAL AND TRANSVAGINAL ULTRASOUND OF PELVIS  TECHNIQUE: Both transabdominal and transvaginal ultrasound examinations of the pelvis were performed. Transabdominal technique was performed for global imaging of the pelvis including uterus, ovaries, adnexal regions, and pelvic cul-de-sac. It was necessary to proceed with  endovaginal exam following the transabdominal exam to visualize the uterus, endometrium, ovaries and adnexa .  COMPARISON: 09/16/2014  FINDINGS: Uterus  Measurements: 7.7 x 3.6 x 4.3 cm. No fibroids or other mass visualized.  Endometrium  Thickness: 7 mm in thickness. No focal abnormality visualized.  Right ovary  Measurements: 4.3 x 3.3 x 3.7 cm. 3.2 cm simple appearing cyst. Complex cyst or follicle  measures up to 1.9 cm, likely hemorrhagic follicle.  Left ovary  Measurements: 4.7 x 2.7 x 3.2 cm. 3.8 cm simple appearing cyst. No septations or nodularity. No internal blood flow.  Other findings  No free fluid.  IMPRESSION: Small cystic areas within the ovaries bilaterally, the largest of which appear simple bilaterally. There is a small complex area within the right ovary, likely hemorrhagic follicle.   Electronically Signed  By: Rolm Baptise M.D.  On: 12/15/2014 14:19  Assessment    H/o endometriosis, has had only one dose of Lupron more than a year ago and did well for 3 mo. Would like to conceive     Plan    Start provera 20 mg daily and request records from [place] 3 weeks and consider starting course of Lupron [place]. For 6-9 mo     Wet prep done   [name] 01/05/2015, 4:39 PM

## 2015-01-06 LAB — WET PREP, GENITAL
TRICH WET PREP: NONE SEEN
YEAST WET PREP: NONE SEEN

## 2015-01-07 ENCOUNTER — Telehealth: Payer: Self-pay | Admitting: *Deleted

## 2015-01-07 MED ORDER — METRONIDAZOLE 0.75 % VA GEL: 1.0000 | Freq: Two times a day (BID) | VAGINAL | Status: DC

## 2015-01-07 NOTE — Telephone Encounter (Addendum)
-----   Message from Woodroe Mode, MD sent at 01/06/2015  4:54 PM EDT ----- BV, Flagyl 500 mg BID 7 days  5/6  0840  Called pt and left message stating that I am calling with test results and information from the doctor.  Please call back and leave message stating whether a detailed message can be left on your voice mail.  Aliesha Dolata RNC  5/6  08      Pt returned call and stated that a detailed message can be left on her voice mail. I called and left message that her test showed +BV. Rx has been to her pharmacy. Because she previously had Metrogel, that is what has been sent. If she prefers the pills, please call back.  Taeveon Keesling RNC

## 2015-01-26 ENCOUNTER — Ambulatory Visit: Payer: Medicaid Other | Admitting: Obstetrics & Gynecology

## 2015-01-29 ENCOUNTER — Emergency Department (HOSPITAL_COMMUNITY)
Admission: EM | Admit: 2015-01-29 | Discharge: 2015-01-30 | Disposition: A | Discharge status: Home / Self Care | Payer: Medicaid Other | Attending: Emergency Medicine | Admitting: Emergency Medicine

## 2015-01-29 ENCOUNTER — Encounter (HOSPITAL_COMMUNITY): Payer: Self-pay | Admitting: *Deleted

## 2015-01-29 DIAGNOSIS — Z79899 Other long term (current) drug therapy: Secondary | ICD-10-CM | POA: Insufficient documentation

## 2015-01-29 DIAGNOSIS — S92252A Displaced fracture of navicular [scaphoid] of left foot, initial encounter for closed fracture: Secondary | ICD-10-CM | POA: Diagnosis not present

## 2015-01-29 DIAGNOSIS — S99922A Unspecified injury of left foot, initial encounter: Secondary | ICD-10-CM | POA: Diagnosis present

## 2015-01-29 DIAGNOSIS — Z8659 Personal history of other mental and behavioral disorders: Secondary | ICD-10-CM | POA: Diagnosis not present

## 2015-01-29 DIAGNOSIS — Y929 Unspecified place or not applicable: Secondary | ICD-10-CM | POA: Insufficient documentation

## 2015-01-29 DIAGNOSIS — Y939 Activity, unspecified: Secondary | ICD-10-CM | POA: Diagnosis not present

## 2015-01-29 DIAGNOSIS — Z720 Tobacco use: Secondary | ICD-10-CM | POA: Diagnosis not present

## 2015-01-29 DIAGNOSIS — W1839XA Other fall on same level, initial encounter: Secondary | ICD-10-CM | POA: Diagnosis not present

## 2015-01-29 DIAGNOSIS — Y999 Unspecified external cause status: Secondary | ICD-10-CM | POA: Diagnosis not present

## 2015-01-29 DIAGNOSIS — S9032XA Contusion of left foot, initial encounter: Secondary | ICD-10-CM | POA: Diagnosis not present

## 2015-01-29 DIAGNOSIS — J45909 Unspecified asthma, uncomplicated: Secondary | ICD-10-CM | POA: Diagnosis not present

## 2015-01-29 DIAGNOSIS — Z792 Long term (current) use of antibiotics: Secondary | ICD-10-CM | POA: Diagnosis not present

## 2015-01-29 DIAGNOSIS — I10 Essential (primary) hypertension: Secondary | ICD-10-CM | POA: Diagnosis not present

## 2015-01-29 MED ORDER — OXYCODONE-ACETAMINOPHEN 5-325 MG PO TABS
1.0000 | ORAL_TABLET | Freq: Once | ORAL | Status: AC
  Administered 2015-01-29: 1 via ORAL
  Filled 2015-01-29: qty 1

## 2015-01-29 NOTE — ED Notes (Signed)
Pt c/o L foot pain after falling off a hooverboard. Mild swelling noted to ankle. Tenderness noted to top of foot. Pt's foot elevated for comfort. Reports unable to bear weight on foot

## 2015-01-29 NOTE — ED Provider Notes (Signed)
CSN: [id number]     Arrival date & time 01/29/15  2308 History   This chart was scribed for non-physician practitioner, Noland Fordyce, PA-C working with Varney Biles, MD, by Erling Conte, ED Scribe. This patient was seen in room TR07C/TR07C and the patient's care was started at 11:56 PM.    Chief Complaint  Patient presents with  . Foot Injury   The history is provided by the patient. No language interpreter was used.    HPI Comments: Samantha Yoder is a 29 y.o. female who presents to the Emergency Department complaining of constant, moderate, left foot pain, onset tonight. Pt states she fell off a hover board and her left ankle broke her fall. She states the pain is worse to the dorsum of the left foot. Pt reports associated swelling. She notes that the pain is exacerbated with bearing weight. She denies head injury or LOC from the fall. She denies previous injury to the left ankle. She denies numbness or sensation loss.   Past Medical History  Diagnosis Date  . Hypertension   . Asthma   . Deliberate self-cutting   . Epistaxis    Past Surgical History  Procedure Laterality Date  . Cholecystectomy     Family History  Problem Relation Age of Onset  . COPD Mother   . Depression Mother   . Hypertension Mother   . Hypertension Father   . Drug abuse Brother   . Cancer Maternal Aunt   . Hyperlipidemia Maternal Uncle   . Miscarriages / Stillbirths Maternal Uncle   . Diabetes Maternal Grandmother   . Miscarriages / Stillbirths Paternal Grandmother    History  Substance Use Topics  . Smoking status: Current Every Day Smoker -- 0.50 packs/day for 15 years    Types: Cigarettes  . Smokeless tobacco: Not on file  . Alcohol Use: Yes     Comment: Social drinker    OB History    Gravida Para Term Preterm AB TAB SAB Ectopic Multiple Living   1 1  1      1      Review of Systems  Musculoskeletal: Positive for joint swelling, arthralgias (L ankle) and gait problem (antalgic).   Neurological: Negative for numbness.  All other systems reviewed and are negative.   Allergies  Other  Home Medications   Prior to Admission medications   Medication Sig Start Date End Date Taking? Authorizing Provider  fluconazole (DIFLUCAN) 150 MG tablet Take 1 tablet (150 mg total) by mouth once. Patient not taking: Reported on 12/19/2014 12/13/14   Lezlie Lye, NP  HYDROcodone-acetaminophen (NORCO/VICODIN) 5-325 MG per tablet Take 1-2 tablets by mouth every 6 (six) hours as needed. 01/30/15   Noland Fordyce, PA-C  ibuprofen (ADVIL,MOTRIN) 600 MG tablet Take 1 tablet (600 mg total) by mouth every 6 (six) hours as needed. 01/30/15   Noland Fordyce, PA-C  lamoTRIgine (LAMICTAL) 25 MG tablet Take 1 tablet (25 mg total) by mouth 2 (two) times daily. 12/20/14   Wandra Arthurs, MD  medroxyPROGESTERone (PROVERA) 10 MG tablet Take 2 tablets (20 mg total) by mouth daily. 01/05/15   Woodroe Mode, MD  metroNIDAZOLE (METROGEL VAGINAL) 0.75 % vaginal gel Place 1 Applicatorful vaginally 2 (two) times daily. 01/07/15   Woodroe Mode, MD   Triage Vitals: BP 155/98 mmHg  Pulse 85  Temp(Src) 98.3 F (36.8 C)  Resp 16  SpO2 100%  LMP 12/30/2014  Physical Exam  Constitutional: She appears well-developed and well-nourished. No distress.  HENT:  Head: Normocephalic and atraumatic.  Eyes: Conjunctivae are normal. No scleral icterus.  Neck: Normal range of motion.  Cardiovascular: Normal rate, regular rhythm and normal heart sounds.   Pulses:      Dorsalis pedis pulses are 2+ on the left side.  Left foot: Cap refill < 3 seconds  Pulmonary/Chest: Effort normal and breath sounds normal. No respiratory distress. She has no wheezes. She has no rales. She exhibits no tenderness.  Abdominal: Soft. Bowel sounds are normal. She exhibits no distension and no mass. There is no tenderness. There is no rebound and no guarding.  Musculoskeletal: Normal range of motion.  Mild to moderate edema to dorsal aspect of  left foot and lateral aspect of left ankle. Diffuse tenderness to foot and lateral aspect of left ankle. Limited ROM of ankle due to severe pain. Able to wiggle toes of left foot.   Neurological: She is alert.  Left foot: Sensation intact, symmetric compared to Right foot  Skin: Skin is warm and dry. She is not diaphoretic.  Mild ecchymosis to dorsum of left foot. Skin intact  Nursing note and vitals reviewed.   ED Course  Procedures (including critical care time)  DIAGNOSTIC STUDIES: Oxygen Saturation is 100% on RA, normal by my interpretation.    COORDINATION OF CARE: 12:05 AM- Will order x-ray of left foot and ankle. Will order Percocet for pain.  Pt advised of plan for treatment and pt agrees.     Labs Review Labs Reviewed - No data to display  Imaging Review Dg Ankle Complete Left  01/30/2015   CLINICAL DATA:  Hoverboard accident this afternoon, midfoot and lateral ankle pain.  EXAM: LEFT FOOT - COMPLETE 3+ VIEW; LEFT ANKLE COMPLETE - 3+ VIEW  COMPARISON:  None.  FINDINGS: Crescentic noncalcified small bony fragment superior to the navicular bone seen only on lateral radiograph. The ankle mortise appears congruent and the tibiofibular syndesmosis intact. No destructive bony lesions. Soft tissue planes are non-suspicious.  IMPRESSION: Small suspected acute navicular avulsion injury, less likely accessory os. Recommend correlation with point tenderness. No dislocation.   Electronically Signed   By: Elon Alas M.D.   On: 01/30/2015 00:48   Dg Foot Complete Left  01/30/2015   CLINICAL DATA:  Hoverboard accident this afternoon, midfoot and lateral ankle pain.  EXAM: LEFT FOOT - COMPLETE 3+ VIEW; LEFT ANKLE COMPLETE - 3+ VIEW  COMPARISON:  None.  FINDINGS: Crescentic noncalcified small bony fragment superior to the navicular bone seen only on lateral radiograph. The ankle mortise appears congruent and the tibiofibular syndesmosis intact. No destructive bony lesions. Soft tissue  planes are non-suspicious.  IMPRESSION: Small suspected acute navicular avulsion injury, less likely accessory os. Recommend correlation with point tenderness. No dislocation.   Electronically Signed   By: Elon Alas M.D.   On: 01/30/2015 00:48     EKG Interpretation None      MDM   Final diagnoses:  Left navicular fracture of foot, closed, initial encounter   Pt is a 29yo female c/o Left foot pain after fall off of hover board. No other injuries.  Left foot neurovascularly in tact. Plain films: significant for small suspected acute navicular avulsion.  Due to edema and overlying tenderness, will tx as avulsion fracture. Pt placed in cam walker boot, crutches provided. Rx: ibuprofen and norco. Home care instructions provided.  Advised to f/u with Dr. Sharol Given, orthopedist for further evaluation and treatment of Left foot avulsion fracture. Return precautions provided. Pt verbalized understanding and  agreement with tx plan.    I personally performed the services described in this documentation, which was scribed in my presence. The recorded information has been reviewed and is accurate.    Noland Fordyce, PA-C 01/30/15 4680  Varney Biles, MD 01/31/15 0830

## 2015-01-29 NOTE — ED Notes (Signed)
Pt to ED c/o L foot pain after falling from a [employer]. swelling noted to area

## 2015-01-30 ENCOUNTER — Emergency Department (HOSPITAL_COMMUNITY): Payer: Medicaid Other

## 2015-01-30 MED ORDER — IBUPROFEN 600 MG PO TABS: 600.0000 mg | ORAL_TABLET | Freq: Four times a day (QID) | ORAL | Status: DC | PRN

## 2015-01-30 MED ORDER — HYDROCODONE-ACETAMINOPHEN 5-325 MG PO TABS: 1.0000 | ORAL_TABLET | Freq: Four times a day (QID) | ORAL | Status: DC | PRN

## 2015-02-11 ENCOUNTER — Emergency Department (HOSPITAL_COMMUNITY)
Admission: EM | Admit: 2015-02-11 | Discharge: 2015-02-11 | Disposition: A | Discharge status: Home / Self Care | Payer: Medicaid Other | Attending: Emergency Medicine | Admitting: Emergency Medicine

## 2015-02-11 ENCOUNTER — Encounter (HOSPITAL_COMMUNITY): Payer: Self-pay

## 2015-02-11 DIAGNOSIS — N809 Endometriosis, unspecified: Secondary | ICD-10-CM | POA: Insufficient documentation

## 2015-02-11 DIAGNOSIS — Z3202 Encounter for pregnancy test, result negative: Secondary | ICD-10-CM | POA: Insufficient documentation

## 2015-02-11 DIAGNOSIS — Z720 Tobacco use: Secondary | ICD-10-CM | POA: Insufficient documentation

## 2015-02-11 DIAGNOSIS — R110 Nausea: Secondary | ICD-10-CM | POA: Diagnosis not present

## 2015-02-11 DIAGNOSIS — I10 Essential (primary) hypertension: Secondary | ICD-10-CM | POA: Diagnosis not present

## 2015-02-11 DIAGNOSIS — R102 Pelvic and perineal pain: Secondary | ICD-10-CM

## 2015-02-11 DIAGNOSIS — J45909 Unspecified asthma, uncomplicated: Secondary | ICD-10-CM | POA: Insufficient documentation

## 2015-02-11 DIAGNOSIS — R1030 Lower abdominal pain, unspecified: Secondary | ICD-10-CM | POA: Diagnosis present

## 2015-02-11 DIAGNOSIS — Z87442 Personal history of urinary calculi: Secondary | ICD-10-CM | POA: Diagnosis not present

## 2015-02-11 DIAGNOSIS — Z79899 Other long term (current) drug therapy: Secondary | ICD-10-CM | POA: Insufficient documentation

## 2015-02-11 HISTORY — DX: Calculus of kidney: N20.0

## 2015-02-11 HISTORY — DX: Endometriosis, unspecified: N80.9

## 2015-02-11 LAB — URINALYSIS, ROUTINE W REFLEX MICROSCOPIC
Bilirubin Urine: NEGATIVE
Glucose, UA: NEGATIVE mg/dL
KETONES UR: NEGATIVE mg/dL
LEUKOCYTES UA: NEGATIVE
Nitrite: NEGATIVE
Protein, ur: NEGATIVE mg/dL
Specific Gravity, Urine: 1.014 (ref 1.005–1.030)
Urobilinogen, UA: 0.2 mg/dL (ref 0.0–1.0)
pH: 7 (ref 5.0–8.0)

## 2015-02-11 LAB — I-STAT CHEM 8, ED
BUN: 6 mg/dL (ref 6–20)
CALCIUM ION: 1.19 mmol/L (ref 1.12–1.23)
Chloride: 106 mmol/L (ref 101–111)
Creatinine, Ser: 0.8 mg/dL (ref 0.44–1.00)
Glucose, Bld: 89 mg/dL (ref 65–99)
HEMATOCRIT: 41 % (ref 36.0–46.0)
HEMOGLOBIN: 13.9 g/dL (ref 12.0–15.0)
Potassium: 3.4 mmol/L — ABNORMAL LOW (ref 3.5–5.1)
Sodium: 139 mmol/L (ref 135–145)
TCO2: 19 mmol/L (ref 0–100)

## 2015-02-11 LAB — URINE MICROSCOPIC-ADD ON

## 2015-02-11 LAB — POC URINE PREG, ED: Preg Test, Ur: NEGATIVE

## 2015-02-11 MED ORDER — HYDROCODONE-ACETAMINOPHEN 5-325 MG PO TABS
1.0000 | ORAL_TABLET | Freq: Once | ORAL | Status: AC
  Administered 2015-02-11: 1 via ORAL
  Filled 2015-02-11: qty 1

## 2015-02-11 NOTE — ED Provider Notes (Signed)
CSN: [id number]     Arrival date & time 02/11/15  2042 History   First MD Initiated Contact with Patient 02/11/15 2059     Chief Complaint  Patient presents with  . Flank Pain     (Consider location/radiation/quality/duration/timing/severity/associated sxs/prior Treatment) Patient is a 29 y.o. female presenting with flank pain and female genitourinary complaint. The history is provided by the patient.  Flank Pain This is a new problem. Associated symptoms include abdominal pain, nausea and urinary symptoms. Pertinent negatives include no coughing, fever or vomiting.  Female GU Problem This is a new problem. The current episode started yesterday. The problem occurs constantly. The problem has been unchanged. Associated symptoms include abdominal pain, nausea and urinary symptoms. Pertinent negatives include no coughing, fever or vomiting. Nothing aggravates the symptoms. She has tried nothing for the symptoms. The treatment provided no relief.    Past Medical History  Diagnosis Date  . Hypertension   . Asthma   . Deliberate self-cutting   . Epistaxis   . Kidney stones   . Endometriosis   . Kidney stones    Past Surgical History  Procedure Laterality Date  . Cholecystectomy     Family History  Problem Relation Age of Onset  . COPD Mother   . Depression Mother   . Hypertension Mother   . Hypertension Father   . Drug abuse Brother   . Cancer Maternal Aunt   . Hyperlipidemia Maternal Uncle   . Miscarriages / Stillbirths Maternal Uncle   . Diabetes Maternal Grandmother   . Miscarriages / Stillbirths Paternal Grandmother    History  Substance Use Topics  . Smoking status: Current Every Day Smoker -- 0.50 packs/day for 15 years    Types: Cigarettes  . Smokeless tobacco: Not on file  . Alcohol Use: Yes     Comment: Social drinker    OB History    Gravida Para Term Preterm AB TAB SAB Ectopic Multiple Living   1 1  1      1      Review of Systems  Constitutional:  Negative for fever.  Respiratory: Negative for cough.   Gastrointestinal: Positive for nausea and abdominal pain. Negative for vomiting.  Genitourinary: Positive for flank pain, vaginal bleeding (spotting 1 day, 3 days ago, not currently), vaginal discharge (clear dc yesterday), vaginal pain and pelvic pain.  All other systems reviewed and are negative.     Allergies  Other  Home Medications   Prior to Admission medications   Medication Sig Start Date End Date Taking? Authorizing Provider  albuterol (PROVENTIL HFA;VENTOLIN HFA) 108 (90 BASE) MCG/ACT inhaler Inhale 1-2 puffs into the lungs every 6 (six) hours as needed for wheezing or shortness of breath.   Yes Historical Provider, MD  ibuprofen (ADVIL,MOTRIN) 800 MG tablet Take 800 mg by mouth every 8 (eight) hours as needed for mild pain or moderate pain.   Yes Historical Provider, MD  fluconazole (DIFLUCAN) 150 MG tablet Take 1 tablet (150 mg total) by mouth once. Patient not taking: Reported on 12/19/2014 12/13/14   Lezlie Lye, NP  HYDROcodone-acetaminophen (NORCO/VICODIN) 5-325 MG per tablet Take 1-2 tablets by mouth every 6 (six) hours as needed. Patient not taking: Reported on 02/11/2015 01/30/15   Noland Fordyce, PA-C  ibuprofen (ADVIL,MOTRIN) 600 MG tablet Take 1 tablet (600 mg total) by mouth every 6 (six) hours as needed. Patient not taking: Reported on 02/11/2015 01/30/15   Noland Fordyce, PA-C  lamoTRIgine (LAMICTAL) 25 MG tablet Take 1 tablet (  25 mg total) by mouth 2 (two) times daily. Patient not taking: Reported on 02/11/2015 12/20/14   Wandra Arthurs, MD  medroxyPROGESTERone (PROVERA) 10 MG tablet Take 2 tablets (20 mg total) by mouth daily. Patient not taking: Reported on 02/11/2015 01/05/15   Woodroe Mode, MD  metroNIDAZOLE (METROGEL VAGINAL) 0.75 % vaginal gel Place 1 Applicatorful vaginally 2 (two) times daily. Patient not taking: Reported on 02/11/2015 01/07/15   Woodroe Mode, MD   BP 117/97 mmHg  Pulse 90  Temp(Src) 99.5  F (37.5 C) (Oral)  Resp 20  Ht 5' (1.524 m)  Wt 175 lb (79.379 kg)  BMI 34.18 kg/m2  SpO2 100%  LMP 12/27/2014 Physical Exam  Constitutional: She is oriented to person, place, and time. She appears well-developed and well-nourished. No distress.  HENT:  Head: Normocephalic.  Eyes: Conjunctivae are normal.  Neck: Neck supple. No tracheal deviation present.  Cardiovascular: Normal rate and regular rhythm.   Pulmonary/Chest: Effort normal. No respiratory distress.  Abdominal: Soft. She exhibits no distension. There is tenderness in the suprapubic area.  Genitourinary: Uterus is tender. Cervix exhibits motion tenderness. Cervix exhibits no discharge and no friability. Right adnexum displays no mass, no tenderness and no fullness. Left adnexum displays no mass, no tenderness and no fullness.  Neurological: She is alert and oriented to person, place, and time.  Skin: Skin is warm and dry.  Psychiatric: She has a normal mood and affect.    ED Course  Procedures (including critical care time) Emergency Focused Ultrasound Exam Limited Retroperitoneal Ultrasound of Kidneys  Performed and interpreted by Dr. Jeanell Sparrow Focused abdominal ultrasound with both kidneys imaged in transverse and longitudinal planes in real-time. Indication: flank pain Findings: bilateral kidneys present, no shadowing, no anechoic areas Interpretation: no hydronephrosis visualized.  no stones or cysts visualized  Images archived electronically  CPT Code: [place] (NOT AT [facility]) - Abnormal; Notable for the following:    Hgb urine dipstick MODERATE (*)    All other components within normal limits  URINE MICROSCOPIC-ADD ON - Abnormal; Notable for the following:    Squamous Epithelial / LPF FEW (*)    Bacteria, UA FEW (*)    All other components within normal limits  I-STAT CHEM 8, ED - Abnormal; Notable for the following:    Potassium 3.4 (*)     All other components within normal limits  POC URINE PREG, ED    Imaging Review No results found.   EKG Interpretation None      MDM   Final diagnoses:  Endometriosis  Pelvic pain in female    29 year old female presents with pelvic pain that started over the last 2 days after she noted some spotting 3 days ago that lasted for about an hour. She had what she described as some clear vaginal discharge that is resolved but she has had ongoing pain in her vagina and in her lower abdomen and when she has a bowel movement she feels as if something is going to "fall out of her".  Initially she is calm and cooperative and a pelvic exam was completed with severe cervical motion tenderness and diffuse patchy lower abdominal pain without adnexal fullness or signs of masses. A bedside ultrasound was completed that showed no evidence of hydronephrosis or obstruction of the urinary system so I doubt kidney stone at this time given her atypical symptoms.  The patient does have a history of endometriosis  and with multiple prior gonorrhea and chlamydia screens that have been negative with similar symptoms I do not feel that further workup or empiric antibiotic coverage is necessary at this time. Urine appears noninfectious. The patient was offered oral narcotic medication and we discussed discharge and follow-up with her primary gynecologist for possible repeat laparoscopy or other management of clinically apparent endometriosis with oligomenorrhea.  The patient became upset while Dr. Jeanell Sparrow and I were in the room for reevaluation, started pulling at her IV, threatening to leave and stating "I will just come back tomorrow and see a different doctor, y'all aren't doing anything, can I get transferred to [facility]?" I was initially going to provide the patient with a short course of narcotic medications to supplement her NSAID use for supportive care but she refused medications here. Do not feel further emergent  workup or imaging for abdominal or pelvic pain is indicated currently and recommended that the patient follow up with her primary gynecologist.   Leo Grosser, MD 02/11/15 [id number]  Pattricia Boss, MD 02/12/15 0003

## 2015-02-11 NOTE — ED Notes (Signed)
Pt arrived via EMS c/o left flank pain x3 days radiates to pelvis, pain 7/10.  EMS gave 30mg  Toradol.  Hx: of kidney stones.

## 2015-02-11 NOTE — ED Notes (Signed)
Pt is very upset and crying stating, "The doctor says I'm just supposed to stay in pain until I see my doctor on the 22nd!" pt upset because EDP has not prescribed her pain medication to last her until her appt with her doctor on the 22nd. Pt states that she wants to be transferred to [facility] but was informed we could not arrange her transportation to [place] for non-emergent reasons. Pt states that she plans to check back into hospital to see another doctor. Pt refused d/c vitals and was ambulatory at d/c with steady gait, NAD.

## 2015-02-11 NOTE — Discharge Instructions (Signed)
Endometriosis Endometriosis is a condition in which the tissue that lines the uterus (endometrium) grows outside of its normal location. The tissue may grow in many locations close to the uterus, but it commonly grows on the ovaries, fallopian tubes, vagina, or bowel. Because the uterus expels, or sheds, its lining every menstrual cycle, there is bleeding wherever the endometrial tissue is located. This can cause pain because blood is irritating to tissues not normally exposed to it.  CAUSES  The cause of endometriosis is not known.  SIGNS AND SYMPTOMS  Often, there are no symptoms. When symptoms are present, they can vary with the location of the displaced tissue. Various symptoms can occur at different times. Although symptoms occur mainly during a woman's menstrual period, they can also occur midcycle and usually stop with menopause. Some people may go months with no symptoms at all. Symptoms may include:   Back or abdominal pain.   Heavier bleeding during periods.   Pain during intercourse.   Painful bowel movements.   Infertility. DIAGNOSIS  Your health care provider will do a physical exam and ask about your symptoms. Various tests may be done, such as:   Blood tests and urine tests. These are done to help rule out other problems.   Ultrasound. This test is done to look for abnormal tissue.   An X-ray of the lower bowel (barium enema).  Laparoscopy. In this procedure, a thin, lighted tube with a tiny camera on the end (laparoscope) is inserted into your abdomen. This helps your health care provider look for abnormal tissue to confirm the diagnosis. The health care provider may also remove a small piece of tissue (biopsy) from any abnormal tissue found. This tissue sample can then be sent to a lab so it can be looked at under a microscope. TREATMENT  Treatment will vary and may include:   Medicines to relieve pain. Nonsteroidal anti-inflammatory drugs (NSAIDs) are a type of  pain medicine that can help to relieve the pain caused by endometriosis.  Hormonal therapy. When using hormonal therapy, periods are eliminated. This eliminates the monthly exposure to blood by the displaced endometrial tissue.   Surgery. Surgery may sometimes be done to remove the abnormal endometrial tissue. In severe cases, surgery may be done to remove the fallopian tubes, uterus, and ovaries (hysterectomy). HOME CARE INSTRUCTIONS   Take all medicines as directed by your health care provider. Do not take aspirin because it may increase bleeding when you are not on hormonal therapy.   Avoid activities that produce pain, including sexual activity. SEEK MEDICAL CARE IF:  You have pelvic pain before, after, or during your periods.  You have pelvic pain between periods that gets worse during your period.  You have pelvic pain during or after sex.  You have pelvic pain with bowel movements or urination, especially during your period.  You have problems getting pregnant.  You have a fever. SEEK IMMEDIATE MEDICAL CARE IF:   Your pain is severe and is not responding to pain medicine.   You have severe nausea and vomiting, or you cannot keep foods down.   You have pain that is limited to the right lower part of your abdomen.   You have swelling or increasing pain in your abdomen.   You see blood in your stool.  MAKE SURE YOU:   Understand these instructions.  Will watch your condition.  Will get help right away if you are not doing well or get worse. Document Released: 08/17/2000 Document  Revised: 01/04/2014 Document Reviewed: 04/17/2013 [facility] Patient Information 2015 [place], [place]. This information is not intended to replace advice given to you by your health care provider. Make sure you discuss any questions you have with your health care provider.

## 2015-02-13 ENCOUNTER — Emergency Department (HOSPITAL_COMMUNITY): Payer: Medicaid Other

## 2015-02-13 ENCOUNTER — Emergency Department (HOSPITAL_COMMUNITY)
Admission: EM | Admit: 2015-02-13 | Discharge: 2015-02-13 | Disposition: A | Discharge status: Home / Self Care | Payer: Medicaid Other | Attending: Emergency Medicine | Admitting: Emergency Medicine

## 2015-02-13 ENCOUNTER — Encounter (HOSPITAL_COMMUNITY): Payer: Self-pay

## 2015-02-13 DIAGNOSIS — Z79899 Other long term (current) drug therapy: Secondary | ICD-10-CM | POA: Diagnosis not present

## 2015-02-13 DIAGNOSIS — Z87442 Personal history of urinary calculi: Secondary | ICD-10-CM | POA: Insufficient documentation

## 2015-02-13 DIAGNOSIS — N830 Follicular cyst of ovary: Secondary | ICD-10-CM | POA: Diagnosis not present

## 2015-02-13 DIAGNOSIS — Z8659 Personal history of other mental and behavioral disorders: Secondary | ICD-10-CM | POA: Insufficient documentation

## 2015-02-13 DIAGNOSIS — N83209 Unspecified ovarian cyst, unspecified side: Secondary | ICD-10-CM

## 2015-02-13 DIAGNOSIS — R109 Unspecified abdominal pain: Secondary | ICD-10-CM | POA: Diagnosis present

## 2015-02-13 DIAGNOSIS — I10 Essential (primary) hypertension: Secondary | ICD-10-CM | POA: Diagnosis not present

## 2015-02-13 DIAGNOSIS — Z720 Tobacco use: Secondary | ICD-10-CM | POA: Diagnosis not present

## 2015-02-13 DIAGNOSIS — J45909 Unspecified asthma, uncomplicated: Secondary | ICD-10-CM | POA: Diagnosis not present

## 2015-02-13 DIAGNOSIS — R102 Pelvic and perineal pain: Secondary | ICD-10-CM

## 2015-02-13 LAB — URINALYSIS, ROUTINE W REFLEX MICROSCOPIC
Bilirubin Urine: NEGATIVE
Glucose, UA: NEGATIVE mg/dL
Hgb urine dipstick: NEGATIVE
Ketones, ur: NEGATIVE mg/dL
Leukocytes, UA: NEGATIVE
Nitrite: NEGATIVE
Protein, ur: NEGATIVE mg/dL
SPECIFIC GRAVITY, URINE: 1.021 (ref 1.005–1.030)
Urobilinogen, UA: 0.2 mg/dL (ref 0.0–1.0)
pH: 6 (ref 5.0–8.0)

## 2015-02-13 MED ORDER — MORPHINE SULFATE 4 MG/ML IJ SOLN
6.0000 mg | Freq: Once | INTRAMUSCULAR | Status: AC
Start: 2015-02-13 — End: 2015-02-13
  Administered 2015-02-13: 6 mg via INTRAVENOUS
  Filled 2015-02-13: qty 2

## 2015-02-13 MED ORDER — KETOROLAC TROMETHAMINE 15 MG/ML IJ SOLN
15.0000 mg | Freq: Once | INTRAMUSCULAR | Status: AC
  Administered 2015-02-13: 15 mg via INTRAVENOUS
  Filled 2015-02-13: qty 1

## 2015-02-13 MED ORDER — HYDROMORPHONE HCL 1 MG/ML IJ SOLN
1.0000 mg | Freq: Once | INTRAMUSCULAR | Status: AC
  Administered 2015-02-13: 1 mg via INTRAVENOUS
  Filled 2015-02-13: qty 1

## 2015-02-13 MED ORDER — HYDROCODONE-ACETAMINOPHEN 5-325 MG PO TABS: 2.0000 | ORAL_TABLET | ORAL | Status: DC | PRN

## 2015-02-13 NOTE — ED Notes (Signed)
Per EMS pt c/o cramping lower abdominal pain x 5 days, seen at [facility] on Friday with instructions to take aleve.

## 2015-02-13 NOTE — ED Notes (Signed)
Patient c/o bilateral lower abdominal pain that radiates into the back. Patient states she was seen 4 days ago for the same. Patient also c/o dysuria. Patient states abdominal pain increases when she has a BM. Last BM x 4 and stool was normal.

## 2015-02-13 NOTE — ED Provider Notes (Signed)
CSN: [id number]     Arrival date & time 02/13/15  1324 History   First MD Initiated Contact with Patient 02/13/15 1643     Chief Complaint  Patient presents with  . Abdominal Pain     (Consider location/radiation/quality/duration/timing/severity/associated sxs/prior Treatment) HPI Comments: 29 year old female with history of depression, pelvic pain, ovarian cyst presents with worsening pelvic discomfort and pressure for the past week. Patient was seen recently in the ER and patient has gynecology follow-up. No currently pregnant. No vaginal bleeding or discharge. Patient had pelvic exam done last visit. Mild radiation the back. History of endometriosis per patient. Nothing has improved her pain, tried Motrin.  Patient is a 29 y.o. female presenting with abdominal pain. The history is provided by the patient.  Abdominal Pain Associated symptoms: no chest pain, no chills, no dysuria, no fever, no shortness of breath and no vomiting     Past Medical History  Diagnosis Date  . Hypertension   . Asthma   . Deliberate self-cutting   . Epistaxis   . Kidney stones   . Endometriosis   . Kidney stones    Past Surgical History  Procedure Laterality Date  . Cholecystectomy     Family History  Problem Relation Age of Onset  . COPD Mother   . Depression Mother   . Hypertension Mother   . Hypertension Father   . Drug abuse Brother   . Cancer Maternal Aunt   . Hyperlipidemia Maternal Uncle   . Miscarriages / Stillbirths Maternal Uncle   . Diabetes Maternal Grandmother   . Miscarriages / Stillbirths Paternal Grandmother    History  Substance Use Topics  . Smoking status: Current Every Day Smoker -- 0.50 packs/day for 15 years    Types: Cigarettes  . Smokeless tobacco: Not on file  . Alcohol Use: Yes     Comment: Social drinker    OB History    Gravida Para Term Preterm AB TAB SAB Ectopic Multiple Living   1 1  1      1      Review of Systems  Constitutional: Negative for fever  and chills.  HENT: Negative for congestion.   Eyes: Negative for visual disturbance.  Respiratory: Negative for shortness of breath.   Cardiovascular: Negative for chest pain.  Gastrointestinal: Positive for abdominal pain. Negative for vomiting.  Genitourinary: Negative for dysuria and flank pain.  Musculoskeletal: Negative for back pain, neck pain and neck stiffness.  Skin: Negative for rash.  Neurological: Negative for light-headedness and headaches.      Allergies  Other  Home Medications   Prior to Admission medications   Medication Sig Start Date End Date Taking? Authorizing Provider  acetaminophen (TYLENOL) 500 MG tablet Take 500 mg by mouth every 6 (six) hours as needed.   Yes Historical Provider, MD  albuterol (PROVENTIL HFA;VENTOLIN HFA) 108 (90 BASE) MCG/ACT inhaler Inhale 1-2 puffs into the lungs every 6 (six) hours as needed for wheezing or shortness of breath.   Yes Historical Provider, MD  ibuprofen (ADVIL,MOTRIN) 800 MG tablet Take 800 mg by mouth every 8 (eight) hours as needed for mild pain or moderate pain.   Yes Historical Provider, MD  fluconazole (DIFLUCAN) 150 MG tablet Take 1 tablet (150 mg total) by mouth once. Patient not taking: Reported on 12/19/2014 12/13/14   Lezlie Lye, NP  HYDROcodone-acetaminophen (NORCO) 5-325 MG per tablet Take 2 tablets by mouth every 4 (four) hours as needed. 02/13/15   Elnora Morrison, MD  HYDROcodone-acetaminophen (  NORCO/VICODIN) 5-325 MG per tablet Take 1-2 tablets by mouth every 6 (six) hours as needed. Patient not taking: Reported on 02/11/2015 01/30/15   Noland Fordyce, PA-C  ibuprofen (ADVIL,MOTRIN) 600 MG tablet Take 1 tablet (600 mg total) by mouth every 6 (six) hours as needed. Patient not taking: Reported on 02/11/2015 01/30/15   Noland Fordyce, PA-C  lamoTRIgine (LAMICTAL) 25 MG tablet Take 1 tablet (25 mg total) by mouth 2 (two) times daily. Patient not taking: Reported on 02/11/2015 12/20/14   Wandra Arthurs, MD   medroxyPROGESTERone (PROVERA) 10 MG tablet Take 2 tablets (20 mg total) by mouth daily. Patient not taking: Reported on 02/11/2015 01/05/15   Woodroe Mode, MD  metroNIDAZOLE (METROGEL VAGINAL) 0.75 % vaginal gel Place 1 Applicatorful vaginally 2 (two) times daily. Patient not taking: Reported on 02/11/2015 01/07/15   Woodroe Mode, MD   BP 132/75 mmHg  Pulse 61  Temp(Src) 98.4 F (36.9 C) (Oral)  Resp 22  Ht 4\' 11"  (1.499 m)  Wt 175 lb (79.379 kg)  BMI 35.33 kg/m2  SpO2 100%  LMP 12/27/2014 Physical Exam  Constitutional: She is oriented to person, place, and time. She appears well-developed and well-nourished.  HENT:  Head: Normocephalic and atraumatic.  Eyes: Conjunctivae are normal. Right eye exhibits no discharge. Left eye exhibits no discharge.  Neck: Normal range of motion. Neck supple. No tracheal deviation present.  Cardiovascular: Normal rate and regular rhythm.   Pulmonary/Chest: Effort normal and breath sounds normal.  Abdominal: Soft. She exhibits no distension. There is tenderness. There is no guarding.  Musculoskeletal: She exhibits no edema.  Neurological: She is alert and oriented to person, place, and time.  Skin: Skin is warm. No rash noted.  Psychiatric: She has a normal mood and affect.  Nursing note and vitals reviewed.   ED Course  Procedures (including critical care time) Labs Review Labs Reviewed  URINALYSIS, ROUTINE W REFLEX MICROSCOPIC (NOT AT [facility])  POC URINE PREG, ED    Imaging Review US Pelvis Complete  02/13/2015   CLINICAL DATA:  29 year old female with severe left pelvic pain for 5 days.  EXAM: TRANSABDOMINAL ULTRASOUND OF PELVIS  TECHNIQUE: Transabdominal ultrasound examination of the pelvis was performed including evaluation of the uterus, ovaries, adnexal regions, and pelvic cul-de-sac.  COMPARISON:  12/15/2014 ultrasound  FINDINGS: Uterus  Measurements: 7.8 x 2.9 x 4.2 cm. No fibroids or other mass visualized.  Endometrium  Thickness: 8 mm.   No focal abnormality visualized.  Right ovary  Measurements: 3.1 x 2.1 x 1.9 cm. Normal appearance/no adnexal mass.  Left ovary  Measurements: A 8.6 x 6.3 x 6 cm hemorrhagic cyst is identified. A small rim of normal ovarian tissue is identified with vascular flow. No other abnormalities identified.  Other findings: There is no evidence of free fluid or solid adnexal mass.  IMPRESSION: 8.6 x 6.3 x 6 cm left ovarian hemorrhagic cyst. No evidence of free fluid.  Normal uterus and right ovary.   Electronically Signed   By: Margarette Canada M.D.   On: 02/13/2015 18:57     EKG Interpretation None      MDM   Final diagnoses:  Hemorrhagic cyst of ovary   Patient presents with worsening pelvic discomfort concern for cyst. Ultrasound notes good vascular flow and large left hemorrhagic cyst. Patient has had symptoms for 5 days. Patient will require close follow-up by gynecology, pain meds given in the ER and also for home.  Results and differential diagnosis were discussed with  the patient/parent/guardian. Close follow up outpatient was discussed, comfortable with the plan.   Medications  morphine 4 MG/ML injection 6 mg (6 mg Intravenous Given 02/13/15 1759)  ketorolac (TORADOL) 15 MG/ML injection 15 mg (15 mg Intravenous Given 02/13/15 1759)  HYDROmorphone (DILAUDID) injection 1 mg (1 mg Intravenous Given 02/13/15 1832)    Filed Vitals:   02/13/15 1346 02/13/15 1615 02/13/15 1834  BP: 150/87 144/100 132/75  Pulse: 88 68 61  Temp: 98.3 F (36.8 C) 99.3 F (37.4 C) 98.4 F (36.9 C)  TempSrc: Oral Oral Oral  Resp:  20 22  Height: 4\' 11"  (1.499 m)    Weight: 175 lb (79.379 kg)    SpO2: 100% 100% 100%    Final diagnoses:  Hemorrhagic cyst of ovary  Pelvic pain      Elnora Morrison, MD 02/13/15 1923

## 2015-02-13 NOTE — ED Notes (Signed)
Pt sleeping.  She c/o abdominal pain that feels like contractions.  She is having difficulty urinating/starting stream - pressure.  This has been going on for the last five days.  She states that she has endometriosis, but this pain is much worse.

## 2015-02-13 NOTE — Progress Notes (Signed)
This rn was retrieved by ultrasound tech due to patient c/o excruciating pain. Upon arrival to room, patient was found curled in a ball at the end of the stretcher.  Assistance was obtained and patient was repositioned in bed.  Dr. Reather Converse notified.  Orders received.

## 2015-02-13 NOTE — Discharge Instructions (Signed)
If you were given medicines take as directed.  If you are on coumadin or contraceptives realize their levels and effectiveness is altered by many different medicines.  If you have any reaction (rash, tongues swelling, other) to the medicines stop taking and see a physician.    If your blood pressure was elevated in the ER make sure you follow up for management with a primary doctor or return for chest pain, shortness of breath or stroke symptoms.  Please follow up as directed and return to the ER or see a physician for new or worsening symptoms.  Thank you. Filed Vitals:   02/13/15 1346 02/13/15 1615 02/13/15 1834  BP: 150/87 144/100 132/75  Pulse: 88 68 61  Temp: 98.3 F (36.8 C) 99.3 F (37.4 C) 98.4 F (36.9 C)  TempSrc: Oral Oral Oral  Resp:  20 22  Height: 4\' 11"  (1.499 m)    Weight: 175 lb (79.379 kg)    SpO2: 100% 100% 100%    Ovarian Cyst An ovarian cyst is a fluid-filled sac that forms on an ovary. The ovaries are small organs that produce eggs in women. Various types of cysts can form on the ovaries. Most are not cancerous. Many do not cause problems, and they often go away on their own. Some may cause symptoms and require treatment. Common types of ovarian cysts include:  Functional cysts--These cysts may occur every month during the menstrual cycle. This is normal. The cysts usually go away with the next menstrual cycle if the woman does not get pregnant. Usually, there are no symptoms with a functional cyst.  Endometrioma cysts--These cysts form from the tissue that lines the uterus. They are also called "chocolate cysts" because they become filled with blood that turns brown. This type of cyst can cause pain in the lower abdomen during intercourse and with your menstrual period.  Cystadenoma cysts--This type develops from the cells on the outside of the ovary. These cysts can get very big and cause lower abdomen pain and pain with intercourse. This type of cyst can twist on  itself, cut off its blood supply, and cause severe pain. It can also easily rupture and cause a lot of pain.  Dermoid cysts--This type of cyst is sometimes found in both ovaries. These cysts may contain different kinds of body tissue, such as skin, teeth, hair, or cartilage. They usually do not cause symptoms unless they get very big.  Theca lutein cysts--These cysts occur when too much of a certain hormone (human chorionic gonadotropin) is produced and overstimulates the ovaries to produce an egg. This is most common after procedures used to assist with the conception of a baby (in vitro fertilization). CAUSES   Fertility drugs can cause a condition in which multiple large cysts are formed on the ovaries. This is called ovarian hyperstimulation syndrome.  A condition called polycystic ovary syndrome can cause hormonal imbalances that can lead to nonfunctional ovarian cysts. SIGNS AND SYMPTOMS  Many ovarian cysts do not cause symptoms. If symptoms are present, they may include:  Pelvic pain or pressure.  Pain in the lower abdomen.  Pain during sexual intercourse.  Increasing girth (swelling) of the abdomen.  Abnormal menstrual periods.  Increasing pain with menstrual periods.  Stopping having menstrual periods without being pregnant. DIAGNOSIS  These cysts are commonly found during a routine or annual pelvic exam. Tests may be ordered to find out more about the cyst. These tests may include:  Ultrasound.  X-ray of the pelvis.  CT scan.  MRI.  Blood tests. TREATMENT  Many ovarian cysts go away on their own without treatment. Your health care provider may want to check your cyst regularly for 2-3 months to see if it changes. For women in menopause, it is particularly important to monitor a cyst closely because of the higher rate of ovarian cancer in menopausal women. When treatment is needed, it may include any of the following:  A procedure to drain the cyst (aspiration). This  may be done using a long needle and ultrasound. It can also be done through a laparoscopic procedure. This involves using a thin, lighted tube with a tiny camera on the end (laparoscope) inserted through a small incision.  Surgery to remove the whole cyst. This may be done using laparoscopic surgery or an open surgery involving a larger incision in the lower abdomen.  Hormone treatment or birth control pills. These methods are sometimes used to help dissolve a cyst. HOME CARE INSTRUCTIONS   Only take over-the-counter or prescription medicines as directed by your health care provider.  Follow up with your health care provider as directed.  Get regular pelvic exams and Pap tests. SEEK MEDICAL CARE IF:   Your periods are late, irregular, or painful, or they stop.  Your pelvic pain or abdominal pain does not go away.  Your abdomen becomes larger or swollen.  You have pressure on your bladder or trouble emptying your bladder completely.  You have pain during sexual intercourse.  You have feelings of fullness, pressure, or discomfort in your stomach.  You lose weight for no apparent reason.  You feel generally ill.  You become constipated.  You lose your appetite.  You develop acne.  You have an increase in body and facial hair.  You are gaining weight, without changing your exercise and eating habits.  You think you are pregnant. SEEK IMMEDIATE MEDICAL CARE IF:   You have increasing abdominal pain.  You feel sick to your stomach (nauseous), and you throw up (vomit).  You develop a fever that comes on suddenly.  You have abdominal pain during a bowel movement.  Your menstrual periods become heavier than usual. MAKE SURE YOU:  Understand these instructions.  Will watch your condition.  Will get help right away if you are not doing well or get worse. Document Released: 08/20/2005 Document Revised: 08/25/2013 Document Reviewed: 04/27/2013 [facility] Patient  Information 2015 [place], [place]. This information is not intended to replace advice given to you by your health care provider. Make sure you discuss any questions you have with your health care provider.

## 2015-02-14 LAB — GC/CHLAMYDIA PROBE AMP (~~LOC~~) NOT AT ARMC
Chlamydia: NEGATIVE
Neisseria Gonorrhea: NEGATIVE

## 2015-02-23 ENCOUNTER — Ambulatory Visit: Payer: Medicaid Other | Admitting: Obstetrics & Gynecology

## 2015-02-23 ENCOUNTER — Telehealth: Payer: Self-pay | Admitting: *Deleted

## 2015-02-23 NOTE — Telephone Encounter (Signed)
Samantha Yoder did not keep appointment for follow up for endometriosis. Called Samantha Yoder and she states she couldn't get a ride and does want to be reschedule. States she has had to go to [facility] twice recently.  She states she will see any doctor if Dr. Roselie Awkward is full.  Advised her i will send to registars and see if they can get her in within 2 weeks if possible and they will call her. In the meantime go to [facility] if pain severe.

## 2015-03-16 ENCOUNTER — Encounter: Payer: Self-pay | Admitting: Obstetrics & Gynecology

## 2015-03-16 ENCOUNTER — Ambulatory Visit (INDEPENDENT_AMBULATORY_CARE_PROVIDER_SITE_OTHER): Payer: Medicaid Other | Admitting: Obstetrics & Gynecology

## 2015-03-16 VITALS — BP 158/85 | HR 82 | Temp 98.5°F | Resp 20 | Ht 59.0 in | Wt 178.8 lb

## 2015-03-16 DIAGNOSIS — N898 Other specified noninflammatory disorders of vagina: Secondary | ICD-10-CM | POA: Diagnosis not present

## 2015-03-16 DIAGNOSIS — N808 Other endometriosis: Secondary | ICD-10-CM | POA: Diagnosis not present

## 2015-03-16 DIAGNOSIS — R102 Pelvic and perineal pain: Secondary | ICD-10-CM | POA: Diagnosis not present

## 2015-03-16 MED ORDER — LEUPROLIDE ACETATE (3 MONTH) 11.25 MG IM KIT
11.2500 mg | PACK | Freq: Once | INTRAMUSCULAR | Status: AC
  Administered 2015-03-16: 11.25 mg via INTRAMUSCULAR

## 2015-03-16 MED ORDER — TRAMADOL-ACETAMINOPHEN 37.5-325 MG PO TABS: 1.0000 | ORAL_TABLET | Freq: Four times a day (QID) | ORAL | Status: DC | PRN

## 2015-03-16 NOTE — Progress Notes (Signed)
Patient ID: Samantha Yoder, female   DOB: September 23, 1985, 29 y.o.   MRN: [id number]  Chief Complaint  Patient presents with  . Follow-up  pelvic pain persisits  HPI Samantha Yoder is a 29 y.o. female.  [id number] Patient's last menstrual period was 02/19/2015 (exact date). Has had ED visit for pain, stopped her provera  HPI  Past Medical History  Diagnosis Date  . Hypertension   . Asthma   . Deliberate self-cutting   . Epistaxis   . Kidney stones   . Endometriosis   . Kidney stones     Past Surgical History  Procedure Laterality Date  . Cholecystectomy      Family History  Problem Relation Age of Onset  . COPD Mother   . Depression Mother   . Hypertension Mother   . Hypertension Father   . Drug abuse Brother   . Cancer Maternal Aunt   . Hyperlipidemia Maternal Uncle   . Miscarriages / Stillbirths Maternal Uncle   . Diabetes Maternal Grandmother   . Miscarriages / Stillbirths Paternal Grandmother     Social History History  Substance Use Topics  . Smoking status: Current Every Day Smoker -- 0.50 packs/day for 15 years    Types: Cigarettes  . Smokeless tobacco: Not on file  . Alcohol Use: Yes     Comment: Social drinker     Allergies  Allergen Reactions  . Other Itching    clorox makes her "itch really bad". Allergy to "fresh roses" also.     Current Outpatient Prescriptions  Medication Sig Dispense Refill  . acetaminophen (TYLENOL) 500 MG tablet Take 500 mg by mouth every 6 (six) hours as needed.    Marland Kitchen ibuprofen (ADVIL,MOTRIN) 800 MG tablet Take 800 mg by mouth every 8 (eight) hours as needed for mild pain or moderate pain.    Marland Kitchen albuterol (PROVENTIL HFA;VENTOLIN HFA) 108 (90 BASE) MCG/ACT inhaler Inhale 1-2 puffs into the lungs every 6 (six) hours as needed for wheezing or shortness of breath.    Marland Kitchen HYDROcodone-acetaminophen (NORCO) 5-325 MG per tablet Take 2 tablets by mouth every 4 (four) hours as needed. (Patient not taking: Reported on 03/16/2015) 10 tablet 0   . lamoTRIgine (LAMICTAL) 25 MG tablet Take 1 tablet (25 mg total) by mouth 2 (two) times daily. (Patient not taking: Reported on 02/11/2015) 60 tablet 0  . medroxyPROGESTERone (PROVERA) 10 MG tablet Take 2 tablets (20 mg total) by mouth daily. (Patient not taking: Reported on 02/11/2015) 30 tablet 5  . metroNIDAZOLE (METROGEL VAGINAL) 0.75 % vaginal gel Place 1 Applicatorful vaginally 2 (two) times daily. (Patient not taking: Reported on 02/11/2015) 70 g 0  . traMADol-acetaminophen (ULTRACET) 37.5-325 MG per tablet Take 1 tablet by mouth every 6 (six) hours as needed. 30 tablet 0   Current Facility-Administered Medications  Medication Dose Route Frequency Provider Last Rate Last Dose  . leuprolide (LUPRON) injection 11.25 mg  11.25 mg Intramuscular Once Woodroe Mode, MD        Review of Systems Review of Systems  Constitutional: Negative.   Respiratory: Negative.   Gastrointestinal: Positive for abdominal pain.  Genitourinary: Positive for menstrual problem (pain), pelvic pain and dyspareunia. Negative for dysuria, vaginal bleeding and vaginal discharge.    Blood pressure 158/85, pulse 82, temperature 98.5 F (36.9 C), temperature source Oral, resp. rate 20, height 4\' 11"  (1.499 m), weight 178 lb 12.8 oz (81.103 kg), last menstrual period 02/19/2015.  Physical Exam Physical Exam  Constitutional: She is oriented to person,  place, and time. She appears well-developed. No distress.  Pulmonary/Chest: Effort normal.  Abdominal: Soft. She exhibits no distension. There is no guarding.  Genitourinary: Vagina normal and uterus normal. No vaginal discharge found.  Pelvic tenderness, no mass palpable  Neurological: She is alert and oriented to person, place, and time.  Skin: Skin is warm and dry.  Psychiatric: She has a normal mood and affect. Her behavior is normal.    Data Reviewed CLINICAL DATA: 29 year old female with severe left pelvic pain for 5 days.  EXAM: TRANSABDOMINAL  ULTRASOUND OF PELVIS  TECHNIQUE: Transabdominal ultrasound examination of the pelvis was performed including evaluation of the uterus, ovaries, adnexal regions, and pelvic cul-de-sac.  COMPARISON: 12/15/2014 ultrasound  FINDINGS: Uterus  Measurements: 7.8 x 2.9 x 4.2 cm. No fibroids or other mass visualized.  Endometrium  Thickness: 8 mm. No focal abnormality visualized.  Right ovary  Measurements: 3.1 x 2.1 x 1.9 cm. Normal appearance/no adnexal mass.  Left ovary  Measurements: A 8.6 x 6.3 x 6 cm hemorrhagic cyst is identified. A small rim of normal ovarian tissue is identified with vascular flow. No other abnormalities identified.  Other findings: There is no evidence of free fluid or solid adnexal mass.  IMPRESSION: 8.6 x 6.3 x 6 cm left ovarian hemorrhagic cyst. No evidence of free fluid.  Normal uterus and right ovary.   Electronically Signed  By: Margarette Canada M.D.  On: 02/13/2015 18:57  Assessment    Endometriosis by history, pelvic pain Left ovarian hemorrhagic cyst by [place]   vaginal discharge-wet prep pending  Plan    Lupron depot 11.25 mg IM now Continue provera Ultracet prn pain rx RTC 4 weeks        Uchenna Rappaport 03/16/2015, 4:04 PM

## 2015-03-16 NOTE — Progress Notes (Signed)
Pt states she is here today to get results and information from the doctor.  Pt also states she thinks she is developing another bacterial vaginosis infection. She states she is having a brown vaginal discharge which sometimes has an odor.

## 2015-03-16 NOTE — Patient Instructions (Signed)
Endometriosis Endometriosis is a condition in which the tissue that lines the uterus (endometrium) grows outside of its normal location. The tissue may grow in many locations close to the uterus, but it commonly grows on the ovaries, fallopian tubes, vagina, or bowel. Because the uterus expels, or sheds, its lining every menstrual cycle, there is bleeding wherever the endometrial tissue is located. This can cause pain because blood is irritating to tissues not normally exposed to it.  CAUSES  The cause of endometriosis is not known.  SIGNS AND SYMPTOMS  Often, there are no symptoms. When symptoms are present, they can vary with the location of the displaced tissue. Various symptoms can occur at different times. Although symptoms occur mainly during a woman's menstrual period, they can also occur midcycle and usually stop with menopause. Some people may go months with no symptoms at all. Symptoms may include:   Back or abdominal pain.   Heavier bleeding during periods.   Pain during intercourse.   Painful bowel movements.   Infertility. DIAGNOSIS  Your health care provider will do a physical exam and ask about your symptoms. Various tests may be done, such as:   Blood tests and urine tests. These are done to help rule out other problems.   Ultrasound. This test is done to look for abnormal tissue.   An X-ray of the lower bowel (barium enema).  Laparoscopy. In this procedure, a thin, lighted tube with a tiny camera on the end (laparoscope) is inserted into your abdomen. This helps your health care provider look for abnormal tissue to confirm the diagnosis. The health care provider may also remove a small piece of tissue (biopsy) from any abnormal tissue found. This tissue sample can then be sent to a lab so it can be looked at under a microscope. TREATMENT  Treatment will vary and may include:   Medicines to relieve pain. Nonsteroidal anti-inflammatory drugs (NSAIDs) are a type of  pain medicine that can help to relieve the pain caused by endometriosis.  Hormonal therapy. When using hormonal therapy, periods are eliminated. This eliminates the monthly exposure to blood by the displaced endometrial tissue.   Surgery. Surgery may sometimes be done to remove the abnormal endometrial tissue. In severe cases, surgery may be done to remove the fallopian tubes, uterus, and ovaries (hysterectomy). HOME CARE INSTRUCTIONS   Take all medicines as directed by your health care provider. Do not take aspirin because it may increase bleeding when you are not on hormonal therapy.   Avoid activities that produce pain, including sexual activity. SEEK MEDICAL CARE IF:  You have pelvic pain before, after, or during your periods.  You have pelvic pain between periods that gets worse during your period.  You have pelvic pain during or after sex.  You have pelvic pain with bowel movements or urination, especially during your period.  You have problems getting pregnant.  You have a fever. SEEK IMMEDIATE MEDICAL CARE IF:   Your pain is severe and is not responding to pain medicine.   You have severe nausea and vomiting, or you cannot keep foods down.   You have pain that is limited to the right lower part of your abdomen.   You have swelling or increasing pain in your abdomen.   You see blood in your stool.  MAKE SURE YOU:   Understand these instructions.  Will watch your condition.  Will get help right away if you are not doing well or get worse. Document Released: 08/17/2000 Document  Revised: 01/04/2014 Document Reviewed: 04/17/2013 [facility] Patient Information 2015 [place], [place]. This information is not intended to replace advice given to you by your health care provider. Make sure you discuss any questions you have with your health care provider.

## 2015-03-17 ENCOUNTER — Telehealth: Payer: Self-pay | Admitting: *Deleted

## 2015-03-17 LAB — WET PREP, GENITAL
Clue Cells Wet Prep HPF POC: NONE SEEN
Trich, Wet Prep: NONE SEEN
YEAST WET PREP: NONE SEEN

## 2015-03-17 NOTE — Telephone Encounter (Addendum)
Patient called and stated that she got Lupron Injection yesterday and has started bleeding today. She was given rx for Tramadol and took it but it is not helping with her pain, she would like to know if its okay to take 2 tramadol at a time. Requesting a call back.   7/15  1015  Called pt and discussed her request and concern. I advised that she may take 2 tablets of Tramadol occasionally for severe pain but not more than 2 at one time. She should take ibuprofen between doses of Tramadol. If she continues to have severe pain which requires 2 tablets of Tramadol, she should call us back. The bleeding she is having is normal and may actually be her period which was due to start in about 1 week. Pt then asked when she would have follow up clinic appt. I stated that Dr. Roselie Awkward will review her [place] result which is scheduled on 8/3 and then make recommendation for follow up. She will receive a call form the nursing staff.  Pt voiced understanding of all information  and instructions given.  Diane Day RNC

## 2015-04-06 ENCOUNTER — Ambulatory Visit (HOSPITAL_COMMUNITY): Admission: RE | Admit: 2015-04-06 | Payer: Medicaid Other | Source: Ambulatory Visit

## 2015-06-28 ENCOUNTER — Encounter (HOSPITAL_COMMUNITY): Payer: Self-pay | Admitting: Physical Medicine and Rehabilitation

## 2015-06-28 ENCOUNTER — Emergency Department (HOSPITAL_COMMUNITY)
Admission: EM | Admit: 2015-06-28 | Discharge: 2015-06-28 | Disposition: A | Discharge status: Home / Self Care | Payer: Medicaid Other | Attending: Emergency Medicine | Admitting: Emergency Medicine

## 2015-06-28 DIAGNOSIS — Z79899 Other long term (current) drug therapy: Secondary | ICD-10-CM | POA: Diagnosis not present

## 2015-06-28 DIAGNOSIS — L0231 Cutaneous abscess of buttock: Secondary | ICD-10-CM | POA: Insufficient documentation

## 2015-06-28 DIAGNOSIS — Z8659 Personal history of other mental and behavioral disorders: Secondary | ICD-10-CM | POA: Insufficient documentation

## 2015-06-28 DIAGNOSIS — Z3202 Encounter for pregnancy test, result negative: Secondary | ICD-10-CM | POA: Diagnosis not present

## 2015-06-28 DIAGNOSIS — N898 Other specified noninflammatory disorders of vagina: Secondary | ICD-10-CM | POA: Diagnosis present

## 2015-06-28 DIAGNOSIS — I10 Essential (primary) hypertension: Secondary | ICD-10-CM | POA: Insufficient documentation

## 2015-06-28 DIAGNOSIS — Z87442 Personal history of urinary calculi: Secondary | ICD-10-CM | POA: Diagnosis not present

## 2015-06-28 DIAGNOSIS — Z720 Tobacco use: Secondary | ICD-10-CM | POA: Diagnosis not present

## 2015-06-28 DIAGNOSIS — L0291 Cutaneous abscess, unspecified: Secondary | ICD-10-CM

## 2015-06-28 DIAGNOSIS — J45909 Unspecified asthma, uncomplicated: Secondary | ICD-10-CM | POA: Diagnosis not present

## 2015-06-28 LAB — WET PREP, GENITAL
Clue Cells Wet Prep HPF POC: NONE SEEN
Trich, Wet Prep: NONE SEEN
YEAST WET PREP: NONE SEEN

## 2015-06-28 LAB — URINALYSIS, ROUTINE W REFLEX MICROSCOPIC
Bilirubin Urine: NEGATIVE
GLUCOSE, UA: NEGATIVE mg/dL
Hgb urine dipstick: NEGATIVE
KETONES UR: NEGATIVE mg/dL
LEUKOCYTES UA: NEGATIVE
NITRITE: NEGATIVE
PROTEIN: NEGATIVE mg/dL
Specific Gravity, Urine: 1.029 (ref 1.005–1.030)
UROBILINOGEN UA: 1 mg/dL (ref 0.0–1.0)
pH: 6.5 (ref 5.0–8.0)

## 2015-06-28 LAB — PREGNANCY, URINE: PREG TEST UR: NEGATIVE

## 2015-06-28 MED ORDER — CHLORHEXIDINE GLUCONATE 4 % EX LIQD
Freq: Every day | CUTANEOUS | Status: DC | PRN
Start: 2015-06-28 — End: 2016-04-21

## 2015-06-28 MED ORDER — AZITHROMYCIN 250 MG PO TABS
1000.0000 mg | ORAL_TABLET | Freq: Once | ORAL | Status: AC
  Administered 2015-06-28: 1000 mg via ORAL
  Filled 2015-06-28: qty 4

## 2015-06-28 MED ORDER — OXYCODONE-ACETAMINOPHEN 5-325 MG PO TABS
2.0000 | ORAL_TABLET | Freq: Once | ORAL | Status: AC
  Administered 2015-06-28: 2 via ORAL
  Filled 2015-06-28: qty 2

## 2015-06-28 MED ORDER — FLUCONAZOLE 150 MG PO TABS: 150.0000 mg | ORAL_TABLET | Freq: Once | ORAL | Status: DC

## 2015-06-28 MED ORDER — CEFTRIAXONE SODIUM 250 MG IJ SOLR
250.0000 mg | Freq: Once | INTRAMUSCULAR | Status: AC
  Administered 2015-06-28: 250 mg via INTRAMUSCULAR
  Filled 2015-06-28: qty 250

## 2015-06-28 MED ORDER — SULFAMETHOXAZOLE-TRIMETHOPRIM 800-160 MG PO TABS: 1.0000 | ORAL_TABLET | Freq: Two times a day (BID) | ORAL | Status: AC

## 2015-06-28 NOTE — ED Notes (Signed)
Pt reports possible abscess to L buttock, also states groin pain and vaginal itching/discharge. Ongoing for several days. Denies urinary symptoms.

## 2015-06-28 NOTE — ED Notes (Signed)
Pt from home for eval of yellow vaginal discharge x3 days, pt also reports odor and reports "It just doesn't smell like me."  Pt denies any abd pain, states on implanon so does not have periods. Pt also reports abscess noted to rectum, clusters of blisters noted and tender to touch. No drainage at this time. nad noted.

## 2015-06-28 NOTE — Discharge Instructions (Signed)
-   Use hibiclens on areas that appear to be developing abscesses - Call the general surgeons office to schedule a follow up appointment for chronic abscesses   Abscess An abscess is an infected area that contains a collection of pus and debris.It can occur in almost any part of the body. An abscess is also known as a furuncle or boil. CAUSES  An abscess occurs when tissue gets infected. This can occur from blockage of oil or sweat glands, infection of hair follicles, or a minor injury to the skin. As the body tries to fight the infection, pus collects in the area and creates pressure under the skin. This pressure causes pain. People with weakened immune systems have difficulty fighting infections and get certain abscesses more often.  SYMPTOMS Usually an abscess develops on the skin and becomes a painful mass that is red, warm, and tender. If the abscess forms under the skin, you may feel a moveable soft area under the skin. Some abscesses break open (rupture) on their own, but most will continue to get worse without care. The infection can spread deeper into the body and eventually into the bloodstream, causing you to feel ill.  DIAGNOSIS  Your caregiver will take your medical history and perform a physical exam. A sample of fluid may also be taken from the abscess to determine what is causing your infection. TREATMENT  Your caregiver may prescribe antibiotic medicines to fight the infection. However, taking antibiotics alone usually does not cure an abscess. Your caregiver may need to make a small cut (incision) in the abscess to drain the pus. In some cases, gauze is packed into the abscess to reduce pain and to continue draining the area. HOME CARE INSTRUCTIONS   Only take over-the-counter or prescription medicines for pain, discomfort, or fever as directed by your caregiver.  If you were prescribed antibiotics, take them as directed. Finish them even if you start to feel better.  If gauze is  used, follow your caregiver's directions for changing the gauze.  To avoid spreading the infection:  Keep your draining abscess covered with a bandage.  Wash your hands well.  Do not share personal care items, towels, or whirlpools with others.  Avoid skin contact with others.  Keep your skin and clothes clean around the abscess.  Keep all follow-up appointments as directed by your caregiver. SEEK MEDICAL CARE IF:   You have increased pain, swelling, redness, fluid drainage, or bleeding.  You have muscle aches, chills, or a general ill feeling.  You have a fever. MAKE SURE YOU:   Understand these instructions.  Will watch your condition.  Will get help right away if you are not doing well or get worse.   This information is not intended to replace advice given to you by your health care provider. Make sure you discuss any questions you have with your health care provider.   Document Released: 05/30/2005 Document Revised: 02/19/2012 Document Reviewed: 11/02/2011 Elsevier Interactive Patient Education [employer].

## 2015-06-28 NOTE — ED Provider Notes (Signed)
CSN: [id number]     Arrival date & time 06/28/15  1300 History   First MD Initiated Contact with Patient 06/28/15 1526     Chief Complaint  Patient presents with  . Abscess  . Vaginal Discharge   HPI  Samantha Yoder is a 29 year old female presenting with vaginal discharge and abscess. Pt reports she noted a yellow-white vaginal discharge 3 days ago. She also reports an odor "that just doesn't smell like me". She reports that her significant other has been "messing around" and she is concerned about STD exposure. She denies abdominal pain, dysuria, hematuria, vaginal discomfort, or flank pain. She is also complaining of two abscesses. One abscesses is located at the top of the gluteal cleft and the other in the left groin. She states they have been there for 1 days and are extremely tender. She denies painful bowel movements and her last normal BM was this morning. Denies fevers, chills, nausea, vomiting, constipation or erythema of the areas.   Past Medical History  Diagnosis Date  . Hypertension   . Asthma   . Deliberate self-cutting   . Epistaxis   . Kidney stones   . Endometriosis   . Kidney stones    Past Surgical History  Procedure Laterality Date  . Cholecystectomy     Family History  Problem Relation Age of Onset  . COPD Mother   . Depression Mother   . Hypertension Mother   . Hypertension Father   . Drug abuse Brother   . Cancer Maternal Aunt   . Hyperlipidemia Maternal Uncle   . Miscarriages / Stillbirths Maternal Uncle   . Diabetes Maternal Grandmother   . Miscarriages / Stillbirths Paternal Grandmother    Social History  Substance Use Topics  . Smoking status: Current Every Day Smoker -- 0.50 packs/day for 15 years    Types: Cigarettes  . Smokeless tobacco: None  . Alcohol Use: Yes     Comment: social drinker   OB History    Gravida Para Term Preterm AB TAB SAB Ectopic Multiple Living   1 1  1      1      Review of Systems  Constitutional: Negative for  fever and chills.  Gastrointestinal: Negative for nausea, vomiting, abdominal pain, diarrhea, constipation and rectal pain.  Genitourinary: Positive for vaginal discharge. Negative for dysuria, hematuria, flank pain, vaginal pain and pelvic pain.  Musculoskeletal: Negative for myalgias.  Skin: Negative for color change.  Neurological: Negative for dizziness and headaches.  Hematological: Negative for adenopathy.      Allergies  Other  Home Medications   Prior to Admission medications   Medication Sig Start Date End Date Taking? Authorizing Provider  acetaminophen (TYLENOL) 500 MG tablet Take 500 mg by mouth every 6 (six) hours as needed.   Yes Historical Provider, MD  albuterol (PROVENTIL HFA;VENTOLIN HFA) 108 (90 BASE) MCG/ACT inhaler Inhale 1-2 puffs into the lungs every 6 (six) hours as needed for wheezing or shortness of breath.   Yes Historical Provider, MD  ibuprofen (ADVIL,MOTRIN) 800 MG tablet Take 800 mg by mouth every 8 (eight) hours as needed for mild pain or moderate pain.   Yes Historical Provider, MD  chlorhexidine (HIBICLENS) 4 % external liquid Apply topically daily as needed. 06/28/15   Relda Agosto, PA-C  fluconazole (DIFLUCAN) 150 MG tablet Take 1 tablet (150 mg total) by mouth once. 06/28/15   Krisanne Lich, PA-C  HYDROcodone-acetaminophen (NORCO) 5-325 MG per tablet Take 2 tablets by mouth  every 4 (four) hours as needed. Patient not taking: Reported on 03/16/2015 02/13/15   Elnora Morrison, MD  lamoTRIgine (LAMICTAL) 25 MG tablet Take 1 tablet (25 mg total) by mouth 2 (two) times daily. Patient not taking: Reported on 02/11/2015 12/20/14   Wandra Arthurs, MD  medroxyPROGESTERone (PROVERA) 10 MG tablet Take 2 tablets (20 mg total) by mouth daily. Patient not taking: Reported on 02/11/2015 01/05/15   Woodroe Mode, MD  metroNIDAZOLE (METROGEL VAGINAL) 0.75 % vaginal gel Place 1 Applicatorful vaginally 2 (two) times daily. Patient not taking: Reported on 02/11/2015 01/07/15   Woodroe Mode, MD  sulfamethoxazole-trimethoprim (BACTRIM DS,SEPTRA DS) 800-160 MG tablet Take 1 tablet by mouth 2 (two) times daily. 06/28/15 07/05/15  Kylee Umana, PA-C  traMADol-acetaminophen (ULTRACET) 37.5-325 MG per tablet Take 1 tablet by mouth every 6 (six) hours as needed. Patient not taking: Reported on 06/28/2015 03/16/15   Woodroe Mode, MD   BP 134/110 mmHg  Pulse 85  Temp(Src) 98.4 F (36.9 C) (Oral)  Resp 18  Ht 4\' 11"  (1.499 m)  Wt 179 lb (81.194 kg)  BMI 36.13 kg/m2  SpO2 96% Physical Exam  Constitutional: She appears well-developed and well-nourished. No distress.  HENT:  Head: Normocephalic and atraumatic.  Eyes: Conjunctivae are normal. Right eye exhibits no discharge. Left eye exhibits no discharge. No scleral icterus.  Neck: Normal range of motion.  Cardiovascular: Normal rate and regular rhythm.   Pulmonary/Chest: Effort normal. No respiratory distress.  Genitourinary: Rectum normal and uterus normal. There is no rash or lesion on the right labia. There is no rash or lesion on the left labia. Cervix exhibits no motion tenderness, no discharge and no friability. Right adnexum displays no mass and no tenderness. Left adnexum displays no mass and no tenderness. No erythema in the vagina. No vaginal discharge found.  Musculoskeletal: Normal range of motion.  Neurological: She is alert. Coordination normal.  Skin: Skin is warm and dry.  Small area of induration without fluctuance or obvious abscess in gluteal cleft. Cluster of small, pearly gray papules over area of induration. Skin surrounding is slightly erythematous. No obvious induration, fluctuance or erythema in the left groin.   Psychiatric: She has a normal mood and affect. Her behavior is normal.  Nursing note and vitals reviewed.   ED Course  Procedures (including critical care time) Labs Review Labs Reviewed  WET PREP, GENITAL - Abnormal; Notable for the following:    WBC, Wet Prep HPF POC FEW (*)    All  other components within normal limits  HERPES SIMPLEX VIRUS CULTURE  RPR  HIV ANTIBODY (ROUTINE TESTING)  URINALYSIS, ROUTINE W REFLEX MICROSCOPIC (NOT AT [facility])  PREGNANCY, URINE  POC URINE PREG, ED  GC/CHLAMYDIA PROBE AMP ([facility]) NOT AT [facility]    Imaging Review No results found. I have personally reviewed and evaluated these images and lab results as part of my medical decision-making.   EKG Interpretation None      MDM   Final diagnoses:  Abscess  Vaginal discharge   Pt presenting with vaginal discharge, concern for STD exposure and 2 abscesses. No discharge noted on pelvic exam. Wet prep positive only for few WBCs. GC probe sent. HIV and RPR pending. No obvious abscesses in gluteal cleft or left groin. Possibly developing abscesses that are too early for I&D. Small area of gray papules in gluteal cleft, swabbed and sent for viral culture. Will treat empirically with bactrim for possible developing abscesses. Instructed to use  hibiclens for washing. Pt requesting prophylactic tx for GC. Pt also requesting diflucan because she frequently gets yeast infections with abx use.  At this time there does not appear to be any evidence of an acute emergency medical condition and the patient appears stable for discharge with appropriate outpatient follow up. Diagnosis was discussed with patient who verbalizes understanding and is agreeable to discharge. Pt case discussed with Dr. Alfonse Spruce who agrees with my plan. Return precautions given in discharge paperwork and discussed with pt at bedside. Pt stable for discharge      Josephina Gip, PA-C 06/29/15 1218  Harvel Quale, MD 06/29/15 2138

## 2015-06-29 LAB — HIV ANTIBODY (ROUTINE TESTING W REFLEX): HIV SCREEN 4TH GENERATION: NONREACTIVE

## 2015-06-29 LAB — RPR: RPR Ser Ql: NONREACTIVE

## 2015-06-29 LAB — GC/CHLAMYDIA PROBE AMP (~~LOC~~) NOT AT ARMC
CHLAMYDIA, DNA PROBE: NEGATIVE
NEISSERIA GONORRHEA: NEGATIVE

## 2015-06-30 LAB — HERPES SIMPLEX VIRUS CULTURE
Culture: DETECTED
SPECIAL REQUESTS: NORMAL

## 2015-07-01 ENCOUNTER — Telehealth (HOSPITAL_BASED_OUTPATIENT_CLINIC_OR_DEPARTMENT_OTHER): Payer: Self-pay | Admitting: Emergency Medicine

## 2015-07-01 NOTE — Progress Notes (Signed)
ED Antimicrobial Stewardship Positive Culture Follow Up   Samantha Yoder is an 29 y.o. female who presented to [facility] on 06/28/2015 with a chief complaint of  Chief Complaint  Patient presents with  . Abscess  . Vaginal Discharge    Recent Results (from the past 720 hour(s))  Wet prep, genital     Status: Abnormal   Collection Time: 06/28/15  5:50 PM  Result Value Ref Range Status   Yeast Wet Prep HPF POC NONE SEEN NONE SEEN Final   Trich, Wet Prep NONE SEEN NONE SEEN Final   Clue Cells Wet Prep HPF POC NONE SEEN NONE SEEN Final   WBC, Wet Prep HPF POC FEW (A) NONE SEEN Final  Herpes simplex virus culture     Status: None   Collection Time: 06/28/15  8:06 PM  Result Value Ref Range Status   Specimen Description BUTTOCKS  Final   Special Requests Normal  Final   Culture   Final    Herpes Simplex Type 2 detected. Performed at [facility]    Report Status 06/30/2015 FINAL  Final    [x]  Patient discharged originally without antimicrobial agent and treatment is now indicated  New antibiotic prescription: Valacyclovir 1 gram po BID x 10 days #20 No refills  ED Provider: Alvina Chou, PA-C  29 y/o F with vaginal discharge and abscess. Concerned for STD exposure. Sent home on fluconazole for potential yeast infection and Bactrim. RPR neg, HIV neg, GC/chlymydia neg. HSV pos. Will start Valacyclovir.   Angela Burke, PharmD Pharmacy Resident Pager: [phone] 07/01/2015, 9:06 AM Infectious Diseases Pharmacist Phone# [phone]

## 2015-07-01 NOTE — Telephone Encounter (Signed)
cycPost ED Visit - Positive Culture Follow-up: Successful Patient Follow-Up  Culture assessed and recommendations reviewed by: []  Heide Guile, Pharm.D., BCPS-AQ ID []  Alycia Rossetti, Pharm.D., BCPS []  [place], [place].D., BCPS, AAHIVP []  Legrand Como, Pharm.D., BCPS, AAHIVP []  Guy, Pharm.D. []  Milus Glazier, [place].D. Angela Burke PharmD  Positive HSV culture  []  Patient discharged without antimicrobial prescription and treatment is now indicated []  Organism is resistant to prescribed ED discharge antimicrobial []  Patient with positive blood cultures  Changes discussed with ED provider: Valacyclovir 1 gram po bid x 10 days New antibiotic prescription Valacyclovir 1 gram po bid x 10 days Called to unknown @ present , phone numbers provided invalid  Contacted patient, 07/01/15 1620   Samantha Yoder 07/01/2015, 4:19 PM

## 2015-07-27 ENCOUNTER — Telehealth (HOSPITAL_COMMUNITY): Payer: Self-pay

## 2015-07-27 NOTE — Telephone Encounter (Signed)
Unable to reach by phone or mail.  Chart closed.   

## 2016-02-22 ENCOUNTER — Emergency Department (HOSPITAL_COMMUNITY): Payer: Medicaid Other

## 2016-02-22 ENCOUNTER — Emergency Department (HOSPITAL_COMMUNITY)
Admission: EM | Admit: 2016-02-22 | Discharge: 2016-02-22 | Disposition: A | Discharge status: Home / Self Care | Payer: Medicaid Other | Attending: Emergency Medicine | Admitting: Emergency Medicine

## 2016-02-22 ENCOUNTER — Encounter (HOSPITAL_COMMUNITY): Payer: Self-pay | Admitting: Emergency Medicine

## 2016-02-22 DIAGNOSIS — F17210 Nicotine dependence, cigarettes, uncomplicated: Secondary | ICD-10-CM | POA: Insufficient documentation

## 2016-02-22 DIAGNOSIS — Z79899 Other long term (current) drug therapy: Secondary | ICD-10-CM | POA: Insufficient documentation

## 2016-02-22 DIAGNOSIS — R1032 Left lower quadrant pain: Secondary | ICD-10-CM | POA: Diagnosis not present

## 2016-02-22 DIAGNOSIS — I10 Essential (primary) hypertension: Secondary | ICD-10-CM | POA: Diagnosis not present

## 2016-02-22 DIAGNOSIS — N838 Other noninflammatory disorders of ovary, fallopian tube and broad ligament: Secondary | ICD-10-CM | POA: Insufficient documentation

## 2016-02-22 DIAGNOSIS — R1030 Lower abdominal pain, unspecified: Secondary | ICD-10-CM

## 2016-02-22 DIAGNOSIS — J45909 Unspecified asthma, uncomplicated: Secondary | ICD-10-CM | POA: Diagnosis not present

## 2016-02-22 DIAGNOSIS — N83209 Unspecified ovarian cyst, unspecified side: Secondary | ICD-10-CM

## 2016-02-22 LAB — CBC
HEMATOCRIT: 39.3 % (ref 36.0–46.0)
Hemoglobin: 12.9 g/dL (ref 12.0–15.0)
MCH: 27 pg (ref 26.0–34.0)
MCHC: 32.8 g/dL (ref 30.0–36.0)
MCV: 82.2 fL (ref 78.0–100.0)
PLATELETS: 247 10*3/uL (ref 150–400)
RBC: 4.78 MIL/uL (ref 3.87–5.11)
RDW: 14.5 % (ref 11.5–15.5)
WBC: 6.5 10*3/uL (ref 4.0–10.5)

## 2016-02-22 LAB — COMPREHENSIVE METABOLIC PANEL
ALT: 14 U/L (ref 14–54)
AST: 21 U/L (ref 15–41)
Albumin: 3.4 g/dL — ABNORMAL LOW (ref 3.5–5.0)
Alkaline Phosphatase: 74 U/L (ref 38–126)
Anion gap: 7 (ref 5–15)
BILIRUBIN TOTAL: 0.7 mg/dL (ref 0.3–1.2)
BUN: 7 mg/dL (ref 6–20)
CHLORIDE: 106 mmol/L (ref 101–111)
CO2: 25 mmol/L (ref 22–32)
CREATININE: 1.37 mg/dL — AB (ref 0.44–1.00)
Calcium: 8.9 mg/dL (ref 8.9–10.3)
GFR, EST AFRICAN AMERICAN: 60 mL/min — AB (ref >60)
GFR, EST NON AFRICAN AMERICAN: 51 mL/min — AB (ref >60)
Glucose, Bld: 105 mg/dL — ABNORMAL HIGH (ref 65–99)
POTASSIUM: 3.3 mmol/L — AB (ref 3.5–5.1)
Sodium: 138 mmol/L (ref 135–145)
TOTAL PROTEIN: 6.3 g/dL — AB (ref 6.5–8.1)

## 2016-02-22 LAB — WET PREP, GENITAL
Clue Cells Wet Prep HPF POC: NONE SEEN
Sperm: NONE SEEN
TRICH WET PREP: NONE SEEN
YEAST WET PREP: NONE SEEN

## 2016-02-22 LAB — LIPASE, BLOOD: LIPASE: 25 U/L (ref 11–51)

## 2016-02-22 LAB — URINALYSIS, ROUTINE W REFLEX MICROSCOPIC
Bilirubin Urine: NEGATIVE
Glucose, UA: NEGATIVE mg/dL
Hgb urine dipstick: NEGATIVE
KETONES UR: NEGATIVE mg/dL
LEUKOCYTES UA: NEGATIVE
NITRITE: NEGATIVE
PROTEIN: NEGATIVE mg/dL
Specific Gravity, Urine: 1.014 (ref 1.005–1.030)
pH: 6.5 (ref 5.0–8.0)

## 2016-02-22 LAB — I-STAT BETA HCG BLOOD, ED (MC, WL, AP ONLY)

## 2016-02-22 LAB — GC/CHLAMYDIA PROBE AMP (~~LOC~~) NOT AT ARMC
Chlamydia: NEGATIVE
Neisseria Gonorrhea: NEGATIVE

## 2016-02-22 MED ORDER — HYDROCODONE-ACETAMINOPHEN 5-325 MG PO TABS
1.0000 | ORAL_TABLET | Freq: Once | ORAL | Status: AC
  Administered 2016-02-22: 1 via ORAL
  Filled 2016-02-22: qty 1

## 2016-02-22 MED ORDER — KETOROLAC TROMETHAMINE 30 MG/ML IJ SOLN
30.0000 mg | Freq: Once | INTRAMUSCULAR | Status: AC
Start: 2016-02-22 — End: 2016-02-22
  Administered 2016-02-22: 30 mg via INTRAVENOUS
  Filled 2016-02-22: qty 1

## 2016-02-22 MED ORDER — HYDROCODONE-ACETAMINOPHEN 5-325 MG PO TABS: 1.0000 | ORAL_TABLET | Freq: Four times a day (QID) | ORAL | Status: DC | PRN

## 2016-02-22 NOTE — Discharge Instructions (Signed)
Ovarian Cyst An ovarian cyst is a fluid-filled sac that forms on an ovary. The ovaries are small organs that produce eggs in women. Various types of cysts can form on the ovaries. Most are not cancerous. Many do not cause problems, and they often go away on their own. Some may cause symptoms and require treatment. Common types of ovarian cysts include:  Functional cysts--These cysts may occur every month during the menstrual cycle. This is normal. The cysts usually go away with the next menstrual cycle if the woman does not get pregnant. Usually, there are no symptoms with a functional cyst.  Endometrioma cysts--These cysts form from the tissue that lines the uterus. They are also called "chocolate cysts" because they become filled with blood that turns brown. This type of cyst can cause pain in the lower abdomen during intercourse and with your menstrual period.  Cystadenoma cysts--This type develops from the cells on the outside of the ovary. These cysts can get very big and cause lower abdomen pain and pain with intercourse. This type of cyst can twist on itself, cut off its blood supply, and cause severe pain. It can also easily rupture and cause a lot of pain.  Dermoid cysts--This type of cyst is sometimes found in both ovaries. These cysts may contain different kinds of body tissue, such as skin, teeth, hair, or cartilage. They usually do not cause symptoms unless they get very big.  Theca lutein cysts--These cysts occur when too much of a certain hormone (human chorionic gonadotropin) is produced and overstimulates the ovaries to produce an egg. This is most common after procedures used to assist with the conception of a baby (in vitro fertilization). CAUSES   Fertility drugs can cause a condition in which multiple large cysts are formed on the ovaries. This is called ovarian hyperstimulation syndrome.  A condition called polycystic ovary syndrome can cause hormonal imbalances that can lead to  nonfunctional ovarian cysts. SIGNS AND SYMPTOMS  Many ovarian cysts do not cause symptoms. If symptoms are present, they may include:  Pelvic pain or pressure.  Pain in the lower abdomen.  Pain during sexual intercourse.  Increasing girth (swelling) of the abdomen.  Abnormal menstrual periods.  Increasing pain with menstrual periods.  Stopping having menstrual periods without being pregnant. DIAGNOSIS  These cysts are commonly found during a routine or annual pelvic exam. Tests may be ordered to find out more about the cyst. These tests may include:  Ultrasound.  X-ray of the pelvis.  CT scan.  MRI.  Blood tests. TREATMENT  Many ovarian cysts go away on their own without treatment. Your health care provider may want to check your cyst regularly for 2-3 months to see if it changes. For women in menopause, it is particularly important to monitor a cyst closely because of the higher rate of ovarian cancer in menopausal women. When treatment is needed, it may include any of the following:  A procedure to drain the cyst (aspiration). This may be done using a long needle and ultrasound. It can also be done through a laparoscopic procedure. This involves using a thin, lighted tube with a tiny camera on the end (laparoscope) inserted through a small incision.  Surgery to remove the whole cyst. This may be done using laparoscopic surgery or an open surgery involving a larger incision in the lower abdomen.  Hormone treatment or birth control pills. These methods are sometimes used to help dissolve a cyst. HOME CARE INSTRUCTIONS   Only take over-the-counter   or prescription medicines as directed by your health care provider.  Follow up with your health care provider as directed.  Get regular pelvic exams and Pap tests. SEEK MEDICAL CARE IF:   Your periods are late, irregular, or painful, or they stop.  Your pelvic pain or abdominal pain does not go away.  Your abdomen becomes  larger or swollen.  You have pressure on your bladder or trouble emptying your bladder completely.  You have pain during sexual intercourse.  You have feelings of fullness, pressure, or discomfort in your stomach.  You lose weight for no apparent reason.  You feel generally ill.  You become constipated.  You lose your appetite.  You develop acne.  You have an increase in body and facial hair.  You are gaining weight, without changing your exercise and eating habits.  You think you are pregnant. SEEK IMMEDIATE MEDICAL CARE IF:   You have increasing abdominal pain.  You feel sick to your stomach (nauseous), and you throw up (vomit).  You develop a fever that comes on suddenly.  You have abdominal pain during a bowel movement.  Your menstrual periods become heavier than usual. MAKE SURE YOU:  Understand these instructions.  Will watch your condition.  Will get help right away if you are not doing well or get worse.   This information is not intended to replace advice given to you by your health care provider. Make sure you discuss any questions you have with your health care provider.   Document Released: 08/20/2005 Document Revised: 08/25/2013 Document Reviewed: 04/27/2013 Elsevier Interactive Patient Education 2016 [employer]  

## 2016-02-22 NOTE — ED Notes (Signed)
Pt removed herself from the monitoring equipment. Pt states the "BP cuff was too tight, I've been watching it"

## 2016-02-22 NOTE — ED Provider Notes (Signed)
CSN: [id number]     Arrival date & time 02/22/16  0310 History  By signing my name below, I, Arianna Nassar, attest that this documentation has been prepared under the direction and in the presence of Merryl Hacker, MD.  Electronically Signed: Julien Nordmann, ED Scribe. 02/22/2016. 4:01 AM.    Chief Complaint  Patient presents with  . Abdominal Pain      The history is provided by the patient. No language interpreter was used.   HPI Comments: Samantha Yoder is a 30 y.o. female who has a PMHx of HTN, endometriosis, ovarian cysts and kidney stones presents to the Emergency Department complaining of constant, gradual worsening, sharp, 8/10, left lower abdominal pain. She notes having associated vaginal pressure. Pt says her pain feels like "constractions". Pt states her last menstrual cycle was June 9th. She notes having similar pain in the past after her period occurs but not this severe. Pt has not taken any medication to alleviate her pain. She states she has concern for possible STD exposure but has an appointment at the [facility] soon. She has a surgical hx of cholecystectomy and cesarean section. Pt denies dysuria, hematuria, or abnormal vaginal discharge.   Past Medical History  Diagnosis Date  . Hypertension   . Asthma   . Deliberate self-cutting   . Epistaxis   . Kidney stones   . Endometriosis   . Kidney stones    Past Surgical History  Procedure Laterality Date  . Cholecystectomy     Family History  Problem Relation Age of Onset  . COPD Mother   . Depression Mother   . Hypertension Mother   . Hypertension Father   . Drug abuse Brother   . Cancer Maternal Aunt   . Hyperlipidemia Maternal Uncle   . Miscarriages / Stillbirths Maternal Uncle   . Diabetes Maternal Grandmother   . Miscarriages / Stillbirths Paternal Grandmother    Social History  Substance Use Topics  . Smoking status: Current Every Day Smoker -- 0.50 packs/day for 15 years    Types:  Cigarettes  . Smokeless tobacco: None  . Alcohol Use: Yes     Comment: social drinker   OB History    Gravida Para Term Preterm AB TAB SAB Ectopic Multiple Living   1 1  1      1      Review of Systems  Constitutional: Negative for fever.  Gastrointestinal: Positive for abdominal pain. Negative for diarrhea and constipation.  Genitourinary: Positive for pelvic pain. Negative for dysuria, hematuria, vaginal bleeding and vaginal discharge.  All other systems reviewed and are negative.     Allergies  Other  Home Medications   Prior to Admission medications   Medication Sig Start Date End Date Taking? Authorizing Provider  chlorhexidine (HIBICLENS) 4 % external liquid Apply topically daily as needed. Patient not taking: Reported on 02/22/2016 06/28/15   Lahoma Crocker Barrett, PA-C  fluconazole (DIFLUCAN) 150 MG tablet Take 1 tablet (150 mg total) by mouth once. Patient not taking: Reported on 02/22/2016 06/28/15   Lahoma Crocker Barrett, PA-C  HYDROcodone-acetaminophen (NORCO/VICODIN) 5-325 MG tablet Take 1 tablet by mouth every 6 (six) hours as needed for moderate pain. 02/22/16   Merryl Hacker, MD  lamoTRIgine (LAMICTAL) 25 MG tablet Take 1 tablet (25 mg total) by mouth 2 (two) times daily. Patient not taking: Reported on 02/11/2015 12/20/14   Wandra Arthurs, MD  medroxyPROGESTERone (PROVERA) 10 MG tablet Take 2 tablets (20 mg total) by mouth daily. Patient  not taking: Reported on 02/11/2015 01/05/15   Woodroe Mode, MD  metroNIDAZOLE (METROGEL VAGINAL) 0.75 % vaginal gel Place 1 Applicatorful vaginally 2 (two) times daily. Patient not taking: Reported on 02/11/2015 01/07/15   Woodroe Mode, MD  traMADol-acetaminophen (ULTRACET) 37.5-325 MG per tablet Take 1 tablet by mouth every 6 (six) hours as needed. Patient not taking: Reported on 06/28/2015 03/16/15   Woodroe Mode, MD   Triage vitals: BP 143/98 mmHg  Pulse 81  SpO2 100% Physical Exam  Constitutional: She is oriented to person, place, and time.  She appears well-developed and well-nourished. No distress.  Overweight  HENT:  Head: Normocephalic and atraumatic.  Cardiovascular: Normal rate, regular rhythm and normal heart sounds.   No murmur heard. Pulmonary/Chest: Effort normal and breath sounds normal. No respiratory distress. She has no wheezes.  Abdominal: Soft. Bowel sounds are normal. There is tenderness. There is no rebound and no guarding.  Diffuse lower abdominal tenderness left greater than right without rebound or guarding, no signs of peritonitis  Genitourinary:  External vaginal exam normal, no lesions noted, pain with introduction of the speculum, inspection shows low riding urethral meatus but no cystocele noted, no normal cervix, no cervical motion tenderness, diffuse adnexal tenderness  Neurological: She is alert and oriented to person, place, and time.  Skin: Skin is warm and dry.  Psychiatric: She has a normal mood and affect.  Nursing note and vitals reviewed.   ED Course  Procedures  DIAGNOSTIC STUDIES: Oxygen Saturation is 100% on RA, normal by my interpretation.  COORDINATION OF CARE:  4:01 AM Discussed treatment plan which includes urinalysis with pt at bedside and pt agreed to plan.  Labs Review Labs Reviewed  WET PREP, GENITAL - Abnormal; Notable for the following:    WBC, Wet Prep HPF POC MODERATE (*)    All other components within normal limits  COMPREHENSIVE METABOLIC PANEL - Abnormal; Notable for the following:    Potassium 3.3 (*)    Glucose, Bld 105 (*)    Creatinine, Ser 1.37 (*)    Total Protein 6.3 (*)    Albumin 3.4 (*)    GFR calc non Af Amer 51 (*)    GFR calc Af Amer 60 (*)    All other components within normal limits  LIPASE, BLOOD  CBC  URINALYSIS, ROUTINE W REFLEX MICROSCOPIC (NOT AT [facility])  I-STAT BETA HCG BLOOD, ED ([facility], [facility], [facility] ONLY)  GC/CHLAMYDIA PROBE AMP ([place]) NOT AT [facility]    Imaging Review US Transvaginal Non-ob  02/22/2016  CLINICAL DATA:  Left lower quadrant  pain. EXAM: TRANSABDOMINAL AND TRANSVAGINAL ULTRASOUND OF PELVIS TECHNIQUE: Both transabdominal and transvaginal ultrasound examinations of the pelvis were performed. Transabdominal technique was performed for global imaging of the pelvis including uterus, ovaries, adnexal regions, and pelvic cul-de-sac. It was necessary to proceed with endovaginal exam following the transabdominal exam to visualize the uterus and ovaries. COMPARISON:  02/13/2015. FINDINGS: Uterus Measurements: 7.6 x 3.4 x 4.4 cm. No fibroids or other mass visualized. Endometrium Thickness: 7.6 mm.  No focal abnormality visualized. Right ovary Measurements: 2.7 x 2.3 x 1.7 cm. 1.8 x 1.5 x 1.5 cm simple cyst. Left ovary Measurements: 3.8 x 2.7 x 2.5 cm. 2.9 x 2.0 x 2.5 cm complex cyst most consistent with hemorrhagic cyst. This complex cyst has diminished significantly in size from prior exam at which time it measured 8.6 x 6.3 x 6.0 cm. Continued follow-up exam suggested to demonstrate clearing. Pregnancy test also suggested to  exclude ectopic pregnancy. Other findings No abnormal free fluid. IMPRESSION: 2.9 x 2.0 x 2.5 cm complex cyst left ovary. This is most consistent with a hemorrhagic cyst. This complex cyst has diminished significant in size from prior exam at which time it measured 8.6 x 6.3 x 6.0 cm. Pregnancy test also suggested to exclude ectopic pregnancy. Short-interval follow up ultrasound in 6-12 weeks is recommended, preferably during the week following the patient's normal menses. Electronically Signed   By: [place]   On: 02/22/2016 07:14   US Pelvis Complete  02/22/2016  CLINICAL DATA:  Left lower quadrant pain. EXAM: TRANSABDOMINAL AND TRANSVAGINAL ULTRASOUND OF PELVIS TECHNIQUE: Both transabdominal and transvaginal ultrasound examinations of the pelvis were performed. Transabdominal technique was performed for global imaging of the pelvis including uterus, ovaries, adnexal regions, and pelvic cul-de-sac. It was  necessary to proceed with endovaginal exam following the transabdominal exam to visualize the uterus and ovaries. COMPARISON:  02/13/2015. FINDINGS: Uterus Measurements: 7.6 x 3.4 x 4.4 cm. No fibroids or other mass visualized. Endometrium Thickness: 7.6 mm.  No focal abnormality visualized. Right ovary Measurements: 2.7 x 2.3 x 1.7 cm. 1.8 x 1.5 x 1.5 cm simple cyst. Left ovary Measurements: 3.8 x 2.7 x 2.5 cm. 2.9 x 2.0 x 2.5 cm complex cyst most consistent with hemorrhagic cyst. This complex cyst has diminished significantly in size from prior exam at which time it measured 8.6 x 6.3 x 6.0 cm. Continued follow-up exam suggested to demonstrate clearing. Pregnancy test also suggested to exclude ectopic pregnancy. Other findings No abnormal free fluid. IMPRESSION: 2.9 x 2.0 x 2.5 cm complex cyst left ovary. This is most consistent with a hemorrhagic cyst. This complex cyst has diminished significant in size from prior exam at which time it measured 8.6 x 6.3 x 6.0 cm. Pregnancy test also suggested to exclude ectopic pregnancy. Short-interval follow up ultrasound in 6-12 weeks is recommended, preferably during the week following the patient's normal menses. Electronically Signed   By: Marcello Moores  Register   On: 02/22/2016 07:14   I have personally reviewed and evaluated these images and lab results as part of my medical decision-making.   EKG Interpretation None      MDM   Final diagnoses:  Lower abdominal pain  Hemorrhagic ovarian cyst    Patient presents with lower abdominal pain. Has had similar pain in the past. Nontoxic-appearing on exam. Vital signs reassuring. Lab work notable for mild AKI with creatinine of 1.3. Baseline 1.1.  Patient was given pain and nausea medication. Pelvic exam with tenderness diffusely. Patient declined STD treatment. STD screening tests sent. Ultrasound shows persistent left hemorrhagic ovarian cyst. This is diminished from prior ultrasound. Patient reports that she has a  follow-up at [facility] clinic on Friday. We'll discharge him with a short course of pain medication and women's clinic follow-up. Patient needs repeat ultrasound in 6-12 weeks.  After history, exam, and medical workup I feel the patient has been appropriately medically screened and is safe for discharge home. Pertinent diagnoses were discussed with the patient. Patient was given return precautions.  I personally performed the services described in this documentation, which was scribed in my presence. The recorded information has been reviewed and is accurate.   Merryl Hacker, MD 02/22/16 [phone]

## 2016-02-22 NOTE — ED Notes (Signed)
Pt c/o vaginal pressure and feels like she is having contractions. Pt denies that there is a chance that she could be pregant. Pt has a \\history  of endometriosis, ovarian cysts, and a miscarriage. LMS was 1 week ago. Pain is worse on the L lower abdominal side. 8/10 "dull/sharp" pain. Ambulatory to the bedside in a bent over position.

## 2016-02-24 ENCOUNTER — Ambulatory Visit: Payer: Medicaid Other | Admitting: Obstetrics & Gynecology

## 2016-03-23 ENCOUNTER — Encounter: Payer: Self-pay | Admitting: Obstetrics & Gynecology

## 2016-03-23 ENCOUNTER — Ambulatory Visit (INDEPENDENT_AMBULATORY_CARE_PROVIDER_SITE_OTHER): Payer: Medicaid Other | Admitting: Obstetrics & Gynecology

## 2016-03-23 VITALS — BP 152/99 | HR 74 | Wt 186.5 lb

## 2016-03-23 DIAGNOSIS — N809 Endometriosis, unspecified: Secondary | ICD-10-CM | POA: Insufficient documentation

## 2016-03-23 DIAGNOSIS — R102 Pelvic and perineal pain: Secondary | ICD-10-CM | POA: Diagnosis not present

## 2016-03-23 MED ORDER — LEUPROLIDE ACETATE (3 MONTH) 11.25 MG IM KIT
11.2500 mg | PACK | Freq: Once | INTRAMUSCULAR | Status: AC
  Administered 2016-03-23: 11.25 mg via INTRAMUSCULAR

## 2016-03-23 MED ORDER — NORETHINDRONE ACETATE 5 MG PO TABS: 5.0000 mg | ORAL_TABLET | Freq: Every day | ORAL | Status: DC

## 2016-03-23 MED ORDER — TRAMADOL HCL 50 MG PO TABS: 50.0000 mg | ORAL_TABLET | Freq: Four times a day (QID) | ORAL | Status: DC | PRN

## 2016-03-23 NOTE — Patient Instructions (Signed)
Endometriosis Endometriosis is a condition in which the tissue that lines the uterus (endometrium) grows outside of its normal location. The tissue may grow in many locations close to the uterus, but it commonly grows on the ovaries, fallopian tubes, vagina, or bowel. Because the uterus expels, or sheds, its lining every menstrual cycle, there is bleeding wherever the endometrial tissue is located. This can cause pain because blood is irritating to tissues not normally exposed to it.  CAUSES  The cause of endometriosis is not known.  SIGNS AND SYMPTOMS  Often, there are no symptoms. When symptoms are present, they can vary with the location of the displaced tissue. Various symptoms can occur at different times. Although symptoms occur mainly during a woman's menstrual period, they can also occur midcycle and usually stop with menopause. Some people may go months with no symptoms at all. Symptoms may include:   Back or abdominal pain.   Heavier bleeding during periods.   Pain during intercourse.   Painful bowel movements.   Infertility. DIAGNOSIS  Your health care provider will do a physical exam and ask about your symptoms. Various tests may be done, such as:   Blood tests and urine tests. These are done to help rule out other problems.   Ultrasound. This test is done to look for abnormal tissue.   An X-ray of the lower bowel (barium enema).  Laparoscopy. In this procedure, a thin, lighted tube with a tiny camera on the end (laparoscope) is inserted into your abdomen. This helps your health care provider look for abnormal tissue to confirm the diagnosis. The health care provider may also remove a small piece of tissue (biopsy) from any abnormal tissue found. This tissue sample can then be sent to a lab so it can be looked at under a microscope. TREATMENT  Treatment will vary and may include:   Medicines to relieve pain. Nonsteroidal anti-inflammatory drugs (NSAIDs) are a type of  pain medicine that can help to relieve the pain caused by endometriosis.  Hormonal therapy. When using hormonal therapy, periods are eliminated. This eliminates the monthly exposure to blood by the displaced endometrial tissue.   Surgery. Surgery may sometimes be done to remove the abnormal endometrial tissue. In severe cases, surgery may be done to remove the fallopian tubes, uterus, and ovaries (hysterectomy). HOME CARE INSTRUCTIONS   Take all medicines as directed by your health care provider. Do not take aspirin because it may increase bleeding when you are not on hormonal therapy.   Avoid activities that produce pain, including sexual activity. SEEK MEDICAL CARE IF:  You have pelvic pain before, after, or during your periods.  You have pelvic pain between periods that gets worse during your period.  You have pelvic pain during or after sex.  You have pelvic pain with bowel movements or urination, especially during your period.  You have problems getting pregnant.  You have a fever. SEEK IMMEDIATE MEDICAL CARE IF:   Your pain is severe and is not responding to pain medicine.   You have severe nausea and vomiting, or you cannot keep foods down.   You have pain that is limited to the right lower part of your abdomen.   You have swelling or increasing pain in your abdomen.   You see blood in your stool.  MAKE SURE YOU:   Understand these instructions.  Will watch your condition.  Will get help right away if you are not doing well or get worse.   This information   is not intended to replace advice given to you by your health care provider. Make sure you discuss any questions you have with your health care provider.   Document Released: 08/17/2000 Document Revised: 09/10/2014 Document Reviewed: 04/17/2013 Elsevier Interactive Patient Education 2016 [employer] Inc[employer]  

## 2016-03-23 NOTE — Progress Notes (Signed)
Patient ID: Samantha Yoder, female   DOB: 06-Sep-1985, 30 y.o.   MRN: [id number]  Chief Complaint  Patient presents with  . Ovarian Cyst  . Pelvic Pain    HPI Samantha Yoder is a 30 y.o. female.  [id number] Patient's last menstrual period was 03/11/2016 (exact date). Pelvic pain resolved after Lupron 12 mo ago but returned 6 mo ago and worse with menses, was seen in ED 6/21, 2.9 cm ovarian cyst was smaller than on previous imaging  HPI  Past Medical History  Diagnosis Date  . Hypertension   . Asthma   . Deliberate self-cutting   . Epistaxis   . Kidney stones   . Endometriosis   . Kidney stones     Past Surgical History  Procedure Laterality Date  . Cholecystectomy      Family History  Problem Relation Age of Onset  . COPD Mother   . Depression Mother   . Hypertension Mother   . Hypertension Father   . Drug abuse Brother   . Cancer Maternal Aunt   . Hyperlipidemia Maternal Uncle   . Miscarriages / Stillbirths Maternal Uncle   . Diabetes Maternal Grandmother   . Miscarriages / Stillbirths Paternal Grandmother     Social History Social History  Substance Use Topics  . Smoking status: Current Every Day Smoker -- 0.50 packs/day for 15 years    Types: Cigarettes  . Smokeless tobacco: None  . Alcohol Use: Yes     Comment: social drinker    Allergies  Allergen Reactions  . Other Itching    clorox makes her "itch really bad". Allergy to "fresh roses" also.     Current Outpatient Prescriptions  Medication Sig Dispense Refill  . ibuprofen (ADVIL,MOTRIN) 600 MG tablet Take 1,200 mg by mouth 3 (three) times daily.    . chlorhexidine (HIBICLENS) 4 % external liquid Apply topically daily as needed. (Patient not taking: Reported on 02/22/2016) 120 mL 0  . fluconazole (DIFLUCAN) 150 MG tablet Take 1 tablet (150 mg total) by mouth once. (Patient not taking: Reported on 02/22/2016) 1 tablet 0  . HYDROcodone-acetaminophen (NORCO/VICODIN) 5-325 MG tablet Take 1 tablet by mouth  every 6 (six) hours as needed for moderate pain. (Patient not taking: Reported on 03/23/2016) 10 tablet 0  . lamoTRIgine (LAMICTAL) 25 MG tablet Take 1 tablet (25 mg total) by mouth 2 (two) times daily. (Patient not taking: Reported on 02/11/2015) 60 tablet 0  . medroxyPROGESTERone (PROVERA) 10 MG tablet Take 2 tablets (20 mg total) by mouth daily. (Patient not taking: Reported on 02/11/2015) 30 tablet 5  . metroNIDAZOLE (METROGEL VAGINAL) 0.75 % vaginal gel Place 1 Applicatorful vaginally 2 (two) times daily. (Patient not taking: Reported on 02/11/2015) 70 g 0  . traMADol-acetaminophen (ULTRACET) 37.5-325 MG per tablet Take 1 tablet by mouth every 6 (six) hours as needed. (Patient not taking: Reported on 06/28/2015) 30 tablet 0   Current Facility-Administered Medications  Medication Dose Route Frequency Provider Last Rate Last Dose  . leuprolide (LUPRON) injection 11.25 mg  11.25 mg Intramuscular Once Woodroe Mode, MD        Review of Systems Review of Systems  Constitutional: Negative.   Gastrointestinal: Negative.   Genitourinary: Positive for vaginal discharge, menstrual problem (pain) and pelvic pain. Negative for vaginal bleeding.    Blood pressure 152/99, pulse 74, weight 186 lb 8 oz (84.596 kg), last menstrual period 03/11/2016.  Physical Exam Physical Exam  Constitutional: She is oriented to person, place, and time. She appears  well-developed. No distress.  Cardiovascular: Normal rate.   Pulmonary/Chest: Effort normal.  Abdominal: Soft. There is no tenderness.  Genitourinary: Vagina normal and uterus normal. No vaginal discharge found.  No mass minimal tenderness  Neurological: She is alert and oriented to person, place, and time.  Skin: Skin is warm and dry.  Psychiatric: She has a normal mood and affect. Her behavior is normal.    Data Reviewed  CLINICAL DATA: Left lower quadrant pain.  EXAM: TRANSABDOMINAL AND TRANSVAGINAL ULTRASOUND OF PELVIS  TECHNIQUE: Both  transabdominal and transvaginal ultrasound examinations of the pelvis were performed. Transabdominal technique was performed for global imaging of the pelvis including uterus, ovaries, adnexal regions, and pelvic cul-de-sac. It was necessary to proceed with endovaginal exam following the transabdominal exam to visualize the uterus and ovaries.  COMPARISON: 02/13/2015.  FINDINGS: Uterus  Measurements: 7.6 x 3.4 x 4.4 cm. No fibroids or other mass visualized.  Endometrium  Thickness: 7.6 mm. No focal abnormality visualized.  Right ovary  Measurements: 2.7 x 2.3 x 1.7 cm. 1.8 x 1.5 x 1.5 cm simple cyst.  Left ovary  Measurements: 3.8 x 2.7 x 2.5 cm. 2.9 x 2.0 x 2.5 cm complex cyst most consistent with hemorrhagic cyst. This complex cyst has diminished significantly in size from prior exam at which time it measured 8.6 x 6.3 x 6.0 cm. Continued follow-up exam suggested to demonstrate clearing. Pregnancy test also suggested to exclude ectopic pregnancy.  Other findings  No abnormal free fluid.  IMPRESSION: 2.9 x 2.0 x 2.5 cm complex cyst left ovary. This is most consistent with a hemorrhagic cyst. This complex cyst has diminished significant in size from prior exam at which time it measured 8.6 x 6.3 x 6.0 cm. Pregnancy test also suggested to exclude ectopic pregnancy. Short-interval follow up ultrasound in 6-12 weeks is recommended, preferably during the week following the patient's normal menses.   Electronically Signed  By: Marcello Moores Register  On: 02/22/2016 07:14      Assessment    Patient Active Problem List   Diagnosis Date Noted  . Endometriosis determined by laparoscopy 03/23/2016  . Pelvic pain in female 01/05/2015  . MDD (major depressive disorder), recurrent severe, without psychosis ([place]) 12/20/2014        Plan    Reorder lupron depot and continue for 6-9 months Norethindrone 5 mg daily RTC 3 mon    Ultram 1-2 Q 6 hr  prn    Aakash Hollomon 03/23/2016, 9:16 AM

## 2016-04-13 ENCOUNTER — Emergency Department (HOSPITAL_COMMUNITY)
Admission: EM | Admit: 2016-04-13 | Discharge: 2016-04-13 | Disposition: A | Discharge status: Home / Self Care | Payer: Medicaid Other

## 2016-04-20 ENCOUNTER — Emergency Department (HOSPITAL_COMMUNITY)
Admission: EM | Admit: 2016-04-20 | Discharge: 2016-04-21 | Disposition: A | Discharge status: Home / Self Care | Payer: Medicaid Other | Attending: Emergency Medicine | Admitting: Emergency Medicine

## 2016-04-20 ENCOUNTER — Encounter (HOSPITAL_COMMUNITY): Payer: Self-pay | Admitting: Emergency Medicine

## 2016-04-20 ENCOUNTER — Emergency Department (HOSPITAL_COMMUNITY): Payer: Medicaid Other

## 2016-04-20 DIAGNOSIS — Z79899 Other long term (current) drug therapy: Secondary | ICD-10-CM | POA: Diagnosis not present

## 2016-04-20 DIAGNOSIS — Z202 Contact with and (suspected) exposure to infections with a predominantly sexual mode of transmission: Secondary | ICD-10-CM | POA: Insufficient documentation

## 2016-04-20 DIAGNOSIS — J40 Bronchitis, not specified as acute or chronic: Secondary | ICD-10-CM | POA: Diagnosis not present

## 2016-04-20 DIAGNOSIS — B9689 Other specified bacterial agents as the cause of diseases classified elsewhere: Secondary | ICD-10-CM

## 2016-04-20 DIAGNOSIS — I10 Essential (primary) hypertension: Secondary | ICD-10-CM | POA: Diagnosis not present

## 2016-04-20 DIAGNOSIS — B9789 Other viral agents as the cause of diseases classified elsewhere: Secondary | ICD-10-CM

## 2016-04-20 DIAGNOSIS — J069 Acute upper respiratory infection, unspecified: Secondary | ICD-10-CM | POA: Diagnosis not present

## 2016-04-20 DIAGNOSIS — Z711 Person with feared health complaint in whom no diagnosis is made: Secondary | ICD-10-CM

## 2016-04-20 DIAGNOSIS — F17210 Nicotine dependence, cigarettes, uncomplicated: Secondary | ICD-10-CM | POA: Insufficient documentation

## 2016-04-20 DIAGNOSIS — N760 Acute vaginitis: Secondary | ICD-10-CM | POA: Diagnosis not present

## 2016-04-20 DIAGNOSIS — N9089 Other specified noninflammatory disorders of vulva and perineum: Secondary | ICD-10-CM

## 2016-04-20 DIAGNOSIS — N898 Other specified noninflammatory disorders of vagina: Secondary | ICD-10-CM | POA: Diagnosis present

## 2016-04-20 LAB — POC URINE PREG, ED: PREG TEST UR: NEGATIVE

## 2016-04-20 LAB — COMPREHENSIVE METABOLIC PANEL
ALBUMIN: 4 g/dL (ref 3.5–5.0)
ALT: 16 U/L (ref 14–54)
AST: 22 U/L (ref 15–41)
Alkaline Phosphatase: 85 U/L (ref 38–126)
Anion gap: 8 (ref 5–15)
BILIRUBIN TOTAL: 0.6 mg/dL (ref 0.3–1.2)
BUN: 11 mg/dL (ref 6–20)
CO2: 24 mmol/L (ref 22–32)
Calcium: 9.6 mg/dL (ref 8.9–10.3)
Chloride: 106 mmol/L (ref 101–111)
Creatinine, Ser: 1.19 mg/dL — ABNORMAL HIGH (ref 0.44–1.00)
GFR calc Af Amer: >60 mL/min (ref >60)
GFR calc non Af Amer: >60 mL/min (ref >60)
GLUCOSE: 103 mg/dL — AB (ref 65–99)
POTASSIUM: 3.4 mmol/L — AB (ref 3.5–5.1)
SODIUM: 138 mmol/L (ref 135–145)
TOTAL PROTEIN: 7.5 g/dL (ref 6.5–8.1)

## 2016-04-20 LAB — URINALYSIS, ROUTINE W REFLEX MICROSCOPIC
Bilirubin Urine: NEGATIVE
Glucose, UA: NEGATIVE mg/dL
Ketones, ur: NEGATIVE mg/dL
LEUKOCYTES UA: NEGATIVE
NITRITE: NEGATIVE
PROTEIN: NEGATIVE mg/dL
Specific Gravity, Urine: 1.03 (ref 1.005–1.030)
pH: 5.5 (ref 5.0–8.0)

## 2016-04-20 LAB — CBC WITH DIFFERENTIAL/PLATELET
BASOS ABS: 0 10*3/uL (ref 0.0–0.1)
BASOS PCT: 1 %
Eosinophils Absolute: 0.2 10*3/uL (ref 0.0–0.7)
Eosinophils Relative: 2 %
HEMATOCRIT: 44.2 % (ref 36.0–46.0)
HEMOGLOBIN: 14.6 g/dL (ref 12.0–15.0)
Lymphocytes Relative: 43 %
Lymphs Abs: 3.7 10*3/uL (ref 0.7–4.0)
MCH: 28 pg (ref 26.0–34.0)
MCHC: 33 g/dL (ref 30.0–36.0)
MCV: 84.8 fL (ref 78.0–100.0)
MONO ABS: 0.5 10*3/uL (ref 0.1–1.0)
Monocytes Relative: 5 %
NEUTROS ABS: 4.2 10*3/uL (ref 1.7–7.7)
NEUTROS PCT: 49 %
Platelets: 312 10*3/uL (ref 150–400)
RBC: 5.21 MIL/uL — ABNORMAL HIGH (ref 3.87–5.11)
RDW: 14.5 % (ref 11.5–15.5)
WBC: 8.6 10*3/uL (ref 4.0–10.5)

## 2016-04-20 LAB — URINE MICROSCOPIC-ADD ON
RBC / HPF: NONE SEEN RBC/hpf (ref 0–5)
WBC, UA: NONE SEEN WBC/hpf (ref 0–5)

## 2016-04-20 MED ORDER — DEXAMETHASONE SODIUM PHOSPHATE 10 MG/ML IJ SOLN
10.0000 mg | Freq: Once | INTRAMUSCULAR | Status: AC
  Administered 2016-04-21: 10 mg via INTRAVENOUS
  Filled 2016-04-20: qty 1

## 2016-04-20 MED ORDER — ALBUTEROL SULFATE (2.5 MG/3ML) 0.083% IN NEBU
5.0000 mg | INHALATION_SOLUTION | Freq: Once | RESPIRATORY_TRACT | Status: AC
  Administered 2016-04-21: 5 mg via RESPIRATORY_TRACT
  Filled 2016-04-20: qty 6

## 2016-04-20 MED ORDER — SODIUM CHLORIDE 0.9 % IV BOLUS (SEPSIS)
500.0000 mL | Freq: Once | INTRAVENOUS | Status: AC
  Administered 2016-04-21: 500 mL via INTRAVENOUS

## 2016-04-20 MED ORDER — IPRATROPIUM BROMIDE 0.02 % IN SOLN
0.5000 mg | Freq: Once | RESPIRATORY_TRACT | Status: AC
  Administered 2016-04-21: 0.5 mg via RESPIRATORY_TRACT
  Filled 2016-04-20: qty 2.5

## 2016-04-20 MED ORDER — KETOROLAC TROMETHAMINE 30 MG/ML IJ SOLN
30.0000 mg | Freq: Once | INTRAMUSCULAR | Status: AC
  Administered 2016-04-21: 30 mg via INTRAVENOUS
  Filled 2016-04-20: qty 1

## 2016-04-20 NOTE — ED Triage Notes (Signed)
Pt. presents with multiple complaints : sinus pressure with chest congestion , productive cough , vaginal discharge , lower lip soreness/mild swelling onset this week , denies fever or chills / respirations unlabored .

## 2016-04-21 LAB — WET PREP, GENITAL
SPERM: NONE SEEN
TRICH WET PREP: NONE SEEN
YEAST WET PREP: NONE SEEN

## 2016-04-21 LAB — HIV ANTIBODY (ROUTINE TESTING W REFLEX): HIV Screen 4th Generation wRfx: NONREACTIVE

## 2016-04-21 LAB — RPR: RPR Ser Ql: NONREACTIVE

## 2016-04-21 MED ORDER — VALACYCLOVIR HCL 1 G PO TABS: 1000.0000 mg | ORAL_TABLET | Freq: Three times a day (TID) | ORAL | 0 refills | Status: AC

## 2016-04-21 MED ORDER — PREDNISONE 20 MG PO TABS: 40.0000 mg | ORAL_TABLET | Freq: Every day | ORAL | 0 refills | Status: DC

## 2016-04-21 MED ORDER — AEROCHAMBER PLUS W/MASK MISC
1.0000 | Freq: Once | Status: AC
  Administered 2016-04-21: 1
  Filled 2016-04-21: qty 1

## 2016-04-21 MED ORDER — ALBUTEROL SULFATE HFA 108 (90 BASE) MCG/ACT IN AERS
2.0000 | INHALATION_SPRAY | RESPIRATORY_TRACT | Status: DC | PRN
  Administered 2016-04-21: 2 via RESPIRATORY_TRACT
  Filled 2016-04-21: qty 6.7

## 2016-04-21 MED ORDER — METRONIDAZOLE 500 MG PO TABS: 500.0000 mg | ORAL_TABLET | Freq: Two times a day (BID) | ORAL | 0 refills | Status: DC

## 2016-04-21 MED ORDER — BENZONATATE 100 MG PO CAPS: 100.0000 mg | ORAL_CAPSULE | Freq: Three times a day (TID) | ORAL | 0 refills | Status: DC

## 2016-04-21 MED ORDER — POTASSIUM CHLORIDE CRYS ER 20 MEQ PO TBCR
40.0000 meq | EXTENDED_RELEASE_TABLET | Freq: Once | ORAL | Status: AC
  Administered 2016-04-21: 40 meq via ORAL
  Filled 2016-04-21: qty 2

## 2016-04-21 MED ORDER — CEFTRIAXONE SODIUM 1 G IJ SOLR
1.0000 g | Freq: Once | INTRAMUSCULAR | Status: AC
  Administered 2016-04-21: 1 g via INTRAVENOUS
  Filled 2016-04-21: qty 10

## 2016-04-21 MED ORDER — AZITHROMYCIN 250 MG PO TABS
1000.0000 mg | ORAL_TABLET | Freq: Once | ORAL | Status: AC
  Administered 2016-04-21: 1000 mg via ORAL
  Filled 2016-04-21: qty 4

## 2016-04-21 MED ORDER — ALBUTEROL SULFATE (2.5 MG/3ML) 0.083% IN NEBU
5.0000 mg | INHALATION_SOLUTION | Freq: Once | RESPIRATORY_TRACT | Status: AC
  Administered 2016-04-21: 5 mg via RESPIRATORY_TRACT
  Filled 2016-04-21: qty 6

## 2016-04-21 MED ORDER — FLUTICASONE PROPIONATE 50 MCG/ACT NA SUSP: 2.0000 | Freq: Every day | NASAL | 2 refills | Status: DC

## 2016-04-21 MED ORDER — METRONIDAZOLE 0.75 % VA GEL: 1.0000 | Freq: Two times a day (BID) | VAGINAL | 0 refills | Status: DC

## 2016-04-21 MED ORDER — VALACYCLOVIR HCL 500 MG PO TABS
1000.0000 mg | ORAL_TABLET | Freq: Once | ORAL | Status: AC
  Administered 2016-04-21: 1000 mg via ORAL
  Filled 2016-04-21: qty 2

## 2016-04-21 MED ORDER — IPRATROPIUM BROMIDE 0.02 % IN SOLN
0.5000 mg | Freq: Once | RESPIRATORY_TRACT | Status: AC
  Administered 2016-04-21: 0.5 mg via RESPIRATORY_TRACT
  Filled 2016-04-21: qty 2.5

## 2016-04-21 NOTE — ED Notes (Signed)
Provided patient with graham crackers and peanut butter. Patient ambulating to restroom with steady gait at this time.

## 2016-04-21 NOTE — ED Notes (Signed)
Patient presents with numerous complaints.  C/o sinus pressure (thinks it may be a cold) and vaginal discharge.  States she has noticed her bottom lip has been swollen - no difficulty breathing

## 2016-04-21 NOTE — ED Notes (Signed)
Patient had to leave because her ride was not going to wait any longer. One of her prescriptions had to be changed because she cannot tolerate PO Flagyl. PA-C changed RX to gel instead. Patient states she will pick up that prescription later today.

## 2016-04-21 NOTE — Discharge Instructions (Signed)
1. Medications: flonase, mucinex, tessalon, albuterol, prednisone, valtrex, usual home medications 2. Treatment: rest, drink plenty of fluids, take tylenol or ibuprofen for fever control 3. Follow Up: Please followup with your primary doctor in 3 days for discussion of your diagnoses and further evaluation after today's visit; if you do not have a primary care doctor use the resource guide provided to find one; Return to the ER for high fevers, difficulty breathing, high fevers or persistent vomiting.  You have been tested for HIV, syphilis, chlamydia and gonorrhea.  These results will be available in approximately 3 days.  Please inform all sexual partners if you test positive for any of these diseases.

## 2016-04-21 NOTE — ED Notes (Signed)
Patient verbalized understanding of discharge instructions and denies any further needs or questions at this time. VS stable. Patient ambulatory with steady gait, RN assisted to ED entrance in wheelchair.

## 2016-04-21 NOTE — ED Provider Notes (Signed)
[facility] DEPT Provider Note   CSN: [id number] Arrival date & time: 04/20/16  2035     History   Chief Complaint Chief Complaint  Patient presents with  . Vaginal Discharge  . Sinus Problem  . Cough    HPI Samantha Yoder is a 30 y.o. female.  Samantha Yoder is a 30 y.o. female  with a hx of hypertension, asthma, kidney stones, endometriosis presents to the Emergency Department complaining of gradual, persistent, progressively worsening cough, congestion, rhinorrhea, subjective fevers and wheezing onset 5 days ago. Patient denies known sick contacts. She has taken NyQuil without relief. She reports that exertion makes her shortness of breath and wheezing worse. She has not used any albuterol at home. She also reports an associated mild frontal, throbbing headache. No vision changes, neck pain or neck stiffness.   Patient also complains of vaginal pain and vaginal discharge. She states her long-term boyfriend of 6 years recently told her that he was exposed to herpes. She reports that he had a single ulcerated lesion on his penis last week. Unknown if it was painful. She reports negative STD testing for herself approximately 5 months ago.  She denies fever, chills, nausea, vomiting, abdominal pain, diarrhea weakness, dizziness, syncope.  She is most concerned about herpes.   The history is provided by the patient and medical records. No language interpreter was used.  Vaginal Discharge  Associated symptoms include dysuria. Pertinent negatives include no fever, no abdominal pain, no nausea and no vomiting.   Associated symptoms include shortness of breath. Pertinent negatives include no abdominal pain.  Cough Associated symptoms include rhinorrhea, sore throat, shortness of breath and wheezing. Pertinent negatives include no chills.    Past Medical History:  Diagnosis Date  . Asthma   . Deliberate self-cutting   . Endometriosis   . Epistaxis   . Hypertension   .  Kidney stones   . Kidney stones     Patient Active Problem List   Diagnosis Date Noted  . Endometriosis determined by laparoscopy 03/23/2016  . Pelvic pain in female 01/05/2015  . 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 CHOLECYSTECTOMY      OB History    Gravida Para Term Preterm AB Living   1 1   1   1    SAB TAB Ectopic Multiple Live Births                   Home Medications    Prior to Admission medications   Medication Sig Start Date End Date Taking? Authorizing Provider  Polyethyl Glycol-Propyl Glycol (SYSTANE) 0.4-0.3 % SOLN Apply 1 drop to eye daily.   Yes Historical Provider, MD  benzonatate (TESSALON) 100 MG capsule Take 1 capsule (100 mg total) by mouth every 8 (eight) hours. 04/21/16   Taevion Sikora, PA-C  fluticasone (FLONASE) 50 MCG/ACT nasal spray Place 2 sprays into both nostrils daily. 04/21/16   Juanito Gonyer, PA-C  metroNIDAZOLE (FLAGYL) 500 MG tablet Take 1 tablet (500 mg total) by mouth 2 (two) times daily. 04/21/16   Vonte Rossin, PA-C  predniSONE (DELTASONE) 20 MG tablet Take 2 tablets (40 mg total) by mouth daily. 04/21/16   Andreina Outten, PA-C  valACYclovir (VALTREX) 1000 MG tablet Take 1 tablet (1,000 mg total) by mouth 3 (three) times daily. 04/21/16 05/05/16  Jarrett Soho Lavone Barrientes, PA-C    Family History Family History  Problem Relation Age of Onset  .  COPD Mother   . Depression Mother   . Hypertension Mother   . Hypertension Father   . Drug abuse Brother   . Cancer Maternal Aunt   . Hyperlipidemia Maternal Uncle   . Miscarriages / Stillbirths Maternal Uncle   . Diabetes Maternal Grandmother   . Miscarriages / Stillbirths Paternal Grandmother     Social History Social History  Substance Use Topics  . Smoking status: Current Every Day Smoker    Packs/day: 0.00    Years: 0.00    Types: Cigarettes  . Smokeless tobacco:  Not on file  . Alcohol use Yes     Allergies   Other   Review of Systems Review of Systems  Constitutional: Negative for chills and fever.  HENT: Positive for congestion, postnasal drip, rhinorrhea, sinus pressure and sore throat.   Respiratory: Positive for cough, shortness of breath and wheezing.   Gastrointestinal: Negative for abdominal pain, nausea and vomiting.  Genitourinary: Positive for dysuria, vaginal discharge and vaginal pain. Negative for flank pain.  Musculoskeletal: Negative for neck pain and neck stiffness.  All other systems reviewed and are negative.    Physical Exam Updated Vital Signs BP 149/94 (BP Location: Right Arm)   Pulse 72   Temp 99.2 F (37.3 C) (Oral)   Resp 20   Ht 5' 0.5" (1.537 m)   Wt 84.1 kg   LMP 04/11/2016   SpO2 98%   BMI 35.61 kg/m   Physical Exam  Constitutional: She appears well-developed and well-nourished. No distress.  Awake, alert, nontoxic appearance  HENT:  Head: Normocephalic and atraumatic.  Right Ear: Tympanic membrane, external ear and ear canal normal.  Left Ear: Tympanic membrane, external ear and ear canal normal.  Nose: Mucosal edema and rhinorrhea present. No epistaxis. Right sinus exhibits maxillary sinus tenderness. Right sinus exhibits no frontal sinus tenderness. Left sinus exhibits maxillary sinus tenderness. Left sinus exhibits no frontal sinus tenderness.  Mouth/Throat: Uvula is midline, oropharynx is clear and moist and mucous membranes are normal. Mucous membranes are not pale and not cyanotic. No oropharyngeal exudate, posterior oropharyngeal edema, posterior oropharyngeal erythema or tonsillar abscesses.  Eyes: Conjunctivae are normal. Pupils are equal, round, and reactive to light. No scleral icterus.  Neck: Normal range of motion and full passive range of motion without pain. Neck supple.  Cardiovascular: Normal rate, regular rhythm, normal heart sounds and intact distal pulses.   No murmur  heard. Pulmonary/Chest: Effort normal. No stridor. No respiratory distress. She has decreased breath sounds. She has wheezes. She has rhonchi.  Significantly diminished breath sounds with wheezes and rhonchi throughout. Patient consistently coughing.  Abdominal: Soft. Bowel sounds are normal. She exhibits no mass. There is no tenderness. There is no rebound and no guarding. Hernia confirmed negative in the right inguinal area and confirmed negative in the left inguinal area.  Genitourinary: Uterus normal. No labial fusion. There is lesion on the right labia. There is no rash or tenderness on the right labia. There is no rash, tenderness or lesion on the left labia. Uterus is not deviated, not enlarged, not fixed and not tender. Cervix exhibits friability. Cervix exhibits no motion tenderness and no discharge. Right adnexum displays no mass, no tenderness and no fullness. Left adnexum displays no mass, no tenderness and no fullness. No erythema, tenderness or bleeding in the vagina. No foreign body in the vagina. No signs of injury around the vagina. Vaginal discharge (Minimal) found.  Genitourinary Comments: Several small lesions on the labia majora, erythematous and  ulcerated; concerning for herpes but no vesicles noted.  Musculoskeletal: Normal range of motion. She exhibits no edema.  Lymphadenopathy:    She has no cervical adenopathy.       Right: No inguinal adenopathy present.       Left: No inguinal adenopathy present.  Neurological: She is alert.  Speech is clear and goal oriented Moves extremities without ataxia  Skin: Skin is warm and dry. No rash noted. She is not diaphoretic. No erythema.  Psychiatric: She has a normal mood and affect.  Nursing note and vitals reviewed.    ED Treatments / Results  Labs (all labs ordered are listed, but only abnormal results are displayed) Labs Reviewed  WET PREP, GENITAL - Abnormal; Notable for the following:       Result Value   Clue Cells Wet  Prep HPF POC PRESENT (*)    WBC, Wet Prep HPF POC MODERATE (*)    All other components within normal limits  URINALYSIS, ROUTINE W REFLEX MICROSCOPIC (NOT AT [facility]) - Abnormal; Notable for the following:    APPearance CLOUDY (*)    Hgb urine dipstick SMALL (*)    All other components within normal limits  CBC WITH DIFFERENTIAL/PLATELET - Abnormal; Notable for the following:    RBC 5.21 (*)    All other components within normal limits  COMPREHENSIVE METABOLIC PANEL - Abnormal; Notable for the following:    Potassium 3.4 (*)    Glucose, Bld 103 (*)    Creatinine, Ser 1.19 (*)    All other components within normal limits  URINE MICROSCOPIC-ADD ON - Abnormal; Notable for the following:    Squamous Epithelial / LPF 0-5 (*)    Bacteria, UA RARE (*)    All other components within normal limits  RPR  HIV ANTIBODY (ROUTINE TESTING)  HSV 1 ANTIBODY, IGG  HSV 2 ANTIBODY, IGG  POC URINE PREG, ED  GC/CHLAMYDIA PROBE AMP ([place]) NOT AT [facility]    EKG  EKG Interpretation None       Radiology Dg Chest 2 View  Result Date: 04/20/2016 CLINICAL DATA:  Acute onset of generalized chest pain, cough, headache, fever, shortness of breath and sore throat. Initial encounter. EXAM: CHEST  2 VIEW COMPARISON:  Chest radiograph performed 11/09/2013 FINDINGS: The lungs are well-aerated and clear. There is no evidence of focal opacification, pleural effusion or pneumothorax. The heart is normal in size; the mediastinal contour is within normal limits. No acute osseous abnormalities are seen. Clips are noted within the right upper quadrant, reflecting prior cholecystectomy. IMPRESSION: No acute cardiopulmonary process seen. Electronically Signed   By: Garald Balding M.D.   On: 04/20/2016 23:46    Procedures Procedures (including critical care time)  Medications Ordered in ED Medications  albuterol (PROVENTIL HFA;VENTOLIN HFA) 108 (90 Base) MCG/ACT inhaler 2 puff (2 puffs Inhalation Given 04/21/16 0239)   albuterol (PROVENTIL) (2.5 MG/3ML) 0.083% nebulizer solution 5 mg (5 mg Nebulization Given 04/21/16 0030)  ipratropium (ATROVENT) nebulizer solution 0.5 mg (0.5 mg Nebulization Given 04/21/16 0030)  dexamethasone (DECADRON) injection 10 mg (10 mg Intravenous Given 04/21/16 0040)  ketorolac (TORADOL) 30 MG/ML injection 30 mg (30 mg Intravenous Given 04/21/16 0040)  sodium chloride 0.9 % bolus 500 mL (0 mLs Intravenous Stopped 04/21/16 0113)  potassium chloride SA (K-DUR,KLOR-CON) CR tablet 40 mEq (40 mEq Oral Given 04/21/16 0122)  azithromycin (ZITHROMAX) tablet 1,000 mg (1,000 mg Oral Given 04/21/16 0122)  cefTRIAXone (ROCEPHIN) 1 g in dextrose 5 % 50 mL IVPB (0  g Intravenous Stopped 04/21/16 0205)  valACYclovir (VALTREX) tablet 1,000 mg (1,000 mg Oral Given 04/21/16 0145)  albuterol (PROVENTIL) (2.5 MG/3ML) 0.083% nebulizer solution 5 mg (5 mg Nebulization Given 04/21/16 0145)  ipratropium (ATROVENT) nebulizer solution 0.5 mg (0.5 mg Nebulization Given 04/21/16 0145)  aerochamber plus with mask device 1 each (1 each Other Given 04/21/16 0239)     Initial Impression / Assessment and Plan / ED Course  I have reviewed the triage vital signs and the nursing notes.  Pertinent labs & imaging results that were available during my care of the patient were reviewed by me and considered in my medical decision making (see chart for details).  Clinical Course  Value Comment By Time  Potassium: (!) 3.4 Mild.  Repleated Abigail Butts, PA-C 08/18 2307  DG Chest 2 View NO PNA Abigail Butts, PA-C 08/19 0115   Wheezing improved but persistent.  Will give 2nd breathing treatment.  She reports her headache is much improved. Abigail Butts, PA-C 08/19 0117   Almost complete resolution of wheezing after 2nd treatment.  Pt reports resolution of her headache.  She reports she is feeling much better and requests d/c home.   Jarrett Soho Ayriana Wix, PA-C 08/19 0240  Hemoglobin: 14.6 No anemia  Abigail Butts, PA-C 08/19 0241  Nitrite: NEGATIVE No evidence of UTI Abigail Butts, PA-C 08/19 0241  WBC, Wet Prep HPF POC: (!) MODERATE WBC and Clue cells noted. No fishy odor of vaginal discharge.  Minimal vaginal discharge.  More concern for STD than BV at this time.   Jarrett Soho Edwyn Inclan, PA-C 08/19 0241    Pt CXR negative for acute infiltrate. Patients symptoms are consistent with URI, likely viral etiology. Discussed that antibiotics are not indicated for viral infections. Pt will be discharged with symptomatic treatment.  Verbalizes understanding and is agreeable with plan. Pt is hemodynamically stable & in NAD prior to dc.  Patient to be discharged with instructions to follow up with PCP or OBGYN. Discussed importance of using protection when sexually active. Pt understands that they have GC/Chlamydia cultures pending and that they will need to inform all sexual partners if results return positive. Pt has been treated prophylacticly with azithromycin and rocephin due to pts history, pelvic exam, and wet prep with increased WBCs. Pt not concerning for PID because hemodynamically stable and no cervical motion tenderness on pelvic exam. Pt has also been treated with flagyl for Bacterial Vaginosis. Pt has been advised to not drink alcohol while on this medication.    Final Clinical Impressions(s) / ED Diagnoses   Final diagnoses:  Viral URI with cough  Bronchitis  Labial lesion  Concern about STD in female without diagnosis  Possible exposure to STD  BV (bacterial vaginosis)    New Prescriptions New Prescriptions   BENZONATATE (TESSALON) 100 MG CAPSULE    Take 1 capsule (100 mg total) by mouth every 8 (eight) hours.   FLUTICASONE (FLONASE) 50 MCG/ACT NASAL SPRAY    Place 2 sprays into both nostrils daily.   METRONIDAZOLE (FLAGYL) 500 MG TABLET    Take 1 tablet (500 mg total) by mouth 2 (two) times daily.   PREDNISONE (DELTASONE) 20 MG TABLET    Take 2 tablets (40 mg total) by mouth  daily.   VALACYCLOVIR (VALTREX) 1000 MG TABLET    Take 1 tablet (1,000 mg total) by mouth 3 (three) times daily.     Jarrett Soho Heman Que, PA-C 04/21/16 0244    Fredia Sorrow, MD 04/26/16 [phone]

## 2016-04-22 LAB — HSV 1 ANTIBODY, IGG: HSV 1 Glycoprotein G Ab, IgG: <0.91 {index} (ref 0.00–0.90)

## 2016-04-22 LAB — HSV 2 ANTIBODY, IGG: HSV 2 Glycoprotein G Ab, IgG: 13.9 {index} — ABNORMAL HIGH (ref 0.00–0.90)

## 2016-04-23 ENCOUNTER — Telehealth: Payer: Self-pay | Admitting: Emergency Medicine

## 2016-04-23 ENCOUNTER — Telehealth (HOSPITAL_BASED_OUTPATIENT_CLINIC_OR_DEPARTMENT_OTHER): Payer: Self-pay | Admitting: Emergency Medicine

## 2016-04-23 LAB — GC/CHLAMYDIA PROBE AMP (~~LOC~~) NOT AT ARMC
CHLAMYDIA, DNA PROBE: NEGATIVE
NEISSERIA GONORRHEA: NEGATIVE

## 2016-05-15 ENCOUNTER — Emergency Department (HOSPITAL_COMMUNITY): Payer: Medicaid Other

## 2016-05-15 ENCOUNTER — Encounter (HOSPITAL_COMMUNITY): Payer: Self-pay

## 2016-05-15 ENCOUNTER — Emergency Department (HOSPITAL_COMMUNITY)
Admission: EM | Admit: 2016-05-15 | Discharge: 2016-05-16 | Disposition: A | Discharge status: Home / Self Care | Payer: Medicaid Other | Attending: Emergency Medicine | Admitting: Emergency Medicine

## 2016-05-15 DIAGNOSIS — J3489 Other specified disorders of nose and nasal sinuses: Secondary | ICD-10-CM | POA: Diagnosis present

## 2016-05-15 DIAGNOSIS — F17210 Nicotine dependence, cigarettes, uncomplicated: Secondary | ICD-10-CM | POA: Insufficient documentation

## 2016-05-15 DIAGNOSIS — D170 Benign lipomatous neoplasm of skin and subcutaneous tissue of head, face and neck: Secondary | ICD-10-CM | POA: Insufficient documentation

## 2016-05-15 DIAGNOSIS — I10 Essential (primary) hypertension: Secondary | ICD-10-CM | POA: Diagnosis not present

## 2016-05-15 DIAGNOSIS — J45909 Unspecified asthma, uncomplicated: Secondary | ICD-10-CM | POA: Insufficient documentation

## 2016-05-15 DIAGNOSIS — D179 Benign lipomatous neoplasm, unspecified: Secondary | ICD-10-CM

## 2016-05-15 DIAGNOSIS — Z79899 Other long term (current) drug therapy: Secondary | ICD-10-CM | POA: Diagnosis not present

## 2016-05-15 MED ORDER — IBUPROFEN 400 MG PO TABS
600.0000 mg | ORAL_TABLET | Freq: Once | ORAL | Status: AC
  Administered 2016-05-15: 600 mg via ORAL
  Filled 2016-05-15: qty 1

## 2016-05-15 MED ORDER — IOPAMIDOL (ISOVUE-300) INJECTION 61%
INTRAVENOUS | Status: AC
  Administered 2016-05-15: 75 mL
  Filled 2016-05-15: qty 75

## 2016-05-15 NOTE — ED Triage Notes (Signed)
Pt complaining of drainage from nose. Denies any bleeding. Pt complaining of headaches x 4 days. Pt states nose is tender.

## 2016-05-15 NOTE — ED Triage Notes (Signed)
Pt states sudden decrease in size of nose. Pt states nose normally larger than current state.

## 2016-05-15 NOTE — ED Provider Notes (Signed)
[facility] DEPT Provider Note   CSN: [id number] Arrival date & time: 05/15/16  2041     History   Chief Complaint Chief Complaint  Patient presents with  . Nose Problem    HPI Samantha Yoder is a 30 y.o. female.  Patient is a 30 year old female with past medical history of asthma, hypertension, and kidney stones. She presents for evaluation of acute change in the shape of her nose. She states that she felt as though she had a "booger in her nose, and when she went to pick it, oil gushed out". She reports that her nose has now drastically changed in shape and size since this incident. She denies any fevers or chills.      Past Medical History:  Diagnosis Date  . Asthma   . Deliberate self-cutting   . Endometriosis   . Epistaxis   . Hypertension   . Kidney stones   . Kidney stones     Patient Active Problem List   Diagnosis Date Noted  . Endometriosis determined by laparoscopy 03/23/2016  . Pelvic pain in female 01/05/2015  . MDD (major depressive disorder), recurrent severe, without psychosis ([place]) 12/20/2014    Past Surgical History:  Procedure Laterality Date  . CHOLECYSTECTOMY      OB History    Gravida Para Term Preterm AB Living   1 1   1   1    SAB TAB Ectopic Multiple Live Births                   Home Medications    Prior to Admission medications   Medication Sig Start Date End Date Taking? Authorizing Provider  benzonatate (TESSALON) 100 MG capsule Take 1 capsule (100 mg total) by mouth every 8 (eight) hours. 04/21/16   Hannah Muthersbaugh, PA-C  fluticasone (FLONASE) 50 MCG/ACT nasal spray Place 2 sprays into both nostrils daily. 04/21/16   Hannah Muthersbaugh, PA-C  metroNIDAZOLE (METROGEL VAGINAL) 0.75 % vaginal gel Place 1 Applicatorful vaginally 2 (two) times daily. 04/21/16   Hannah Muthersbaugh, PA-C  Polyethyl Glycol-Propyl Glycol (SYSTANE) 0.4-0.3 % SOLN Apply 1 drop to eye daily.    Historical Provider, MD  predniSONE (DELTASONE)  20 MG tablet Take 2 tablets (40 mg total) by mouth daily. 04/21/16   Jarrett Soho Muthersbaugh, PA-C    Family History Family History  Problem Relation Age of Onset  . COPD Mother   . Depression Mother   . Hypertension Mother   . Hypertension Father   . Drug abuse Brother   . Cancer Maternal Aunt   . Hyperlipidemia Maternal Uncle   . Miscarriages / Stillbirths Maternal Uncle   . Diabetes Maternal Grandmother   . Miscarriages / Stillbirths Paternal Grandmother     Social History Social History  Substance Use Topics  . Smoking status: Current Every Day Smoker    Packs/day: 0.00    Years: 0.00    Types: Cigarettes  . Smokeless tobacco: Never Used  . Alcohol use Yes     Allergies   Other   Review of Systems Review of Systems  All other systems reviewed and are negative.    Physical Exam Updated Vital Signs BP (!) 143/110   Pulse 112   Temp 98.3 F (36.8 C) (Oral)   Resp 16   Ht 4\' 11"  (1.499 m)   Wt 180 lb (81.6 kg)   LMP 04/11/2016   SpO2 99%   BMI 36.36 kg/m   Physical Exam  Constitutional: She is oriented to  person, place, and time. She appears well-developed and well-nourished. No distress.  HENT:  Head: Normocephalic and atraumatic.  The nose appears symmetrical. The nares are clear. There is no bleeding or leakage of fluid noted. She does have pain with manipulation of the cartilage and tenderness over the bridge of the nose.  Neck: Normal range of motion. Neck supple.  Cardiovascular: Normal rate and regular rhythm.   Pulmonary/Chest: Effort normal.  Musculoskeletal: Normal range of motion.  Neurological: She is alert and oriented to person, place, and time.  Skin: Skin is warm and dry. She is not diaphoretic.  Nursing note and vitals reviewed.    ED Treatments / Results  Labs (all labs ordered are listed, but only abnormal results are displayed) Labs Reviewed  I-STAT CHEM 8, ED    EKG  EKG Interpretation None       Radiology No results  found.  Procedures Procedures (including critical care time)  Medications Ordered in ED Medications - No data to display   Initial Impression / Assessment and Plan / ED Course  I have reviewed the triage vital signs and the nursing notes.  Pertinent labs & imaging results that were available during my care of the patient were reviewed by me and considered in my medical decision making (see chart for details).  Clinical Course    Patient presents with complaints of an abrupt change to the shape of her nose and a rush of fluid. Appears as though she has a lipoma to the bridge of her nose by CT scan. I am uncertain as to whether or not there was some sort of communication to clean this and her nasal passage that caused a rush of fluid in change in the shape of her nose she is describing. This is somewhat of a confusing clinical picture. Either way, I feel as though follow-up with an ENT would be appropriate. She will be given the follow-up information for the ENT on call and advised to call and make these arrangements.  Final Clinical Impressions(s) / ED Diagnoses   Final diagnoses:  None    New Prescriptions New Prescriptions   No medications on file     Veryl Speak, MD 05/16/16 0001

## 2016-05-16 NOTE — Discharge Instructions (Signed)
Ibuprofen 600 mg every 6 hours as needed for pain.  Follow-up with [facility] to arrange a follow-up appointment. The contact information for the office has been provided in this discharge summary for you to call to make these arrangements.

## 2016-06-05 IMAGING — US US TRANSVAGINAL NON-OB
1 series · 13 of 25 positions shown · non-contrast
Comparison: CT 04/07/2014

CLINICAL DATA: Abdominal pain.

EXAM:
TRANSABDOMINAL AND TRANSVAGINAL ULTRASOUND OF PELVIS
DOPPLER ULTRASOUND OF OVARIES
TECHNIQUE: Both transabdominal and transvaginal ultrasound examinations of the
pelvis were performed. Transabdominal technique was performed for
global imaging of the pelvis including uterus, ovaries, adnexal
regions, and pelvic cul-de-sac.
It was necessary to proceed with endovaginal exam following the
transabdominal exam to visualize the ovaries. Color and duplex
Doppler ultrasound was utilized to evaluate blood flow to the
ovaries.

[Series 1: us transvaginal non-ob · 0.24mm/px · 13 of 84 slices shown]
[im 1/84]
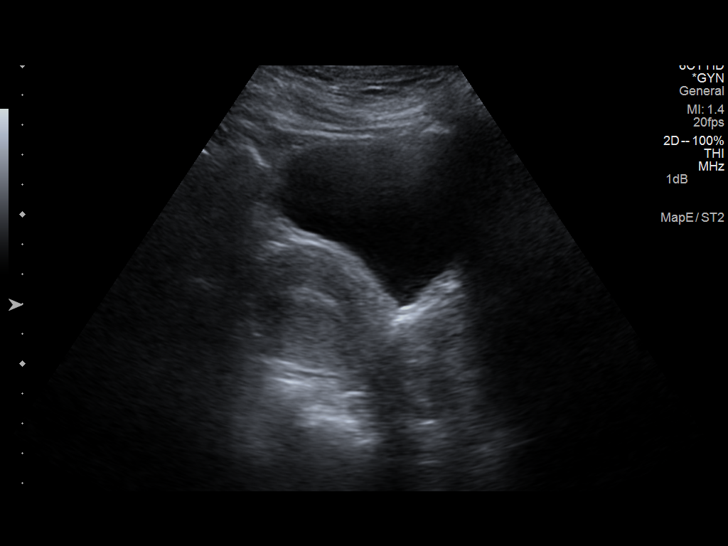
[im 7/84]
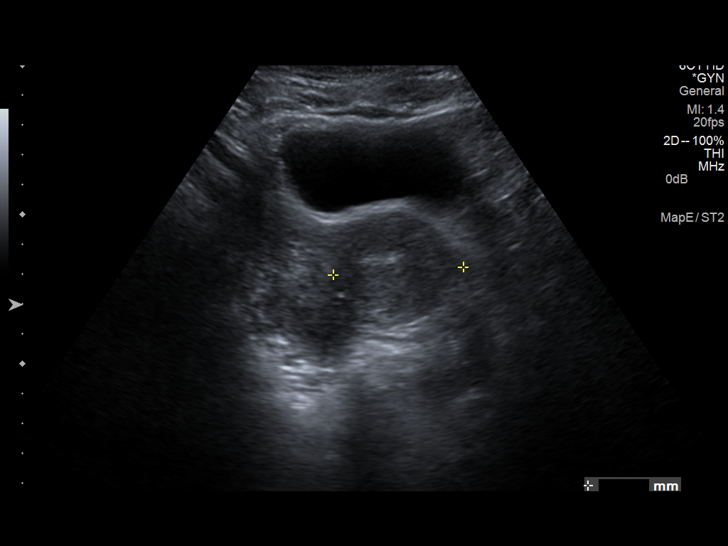
[im 14/84]
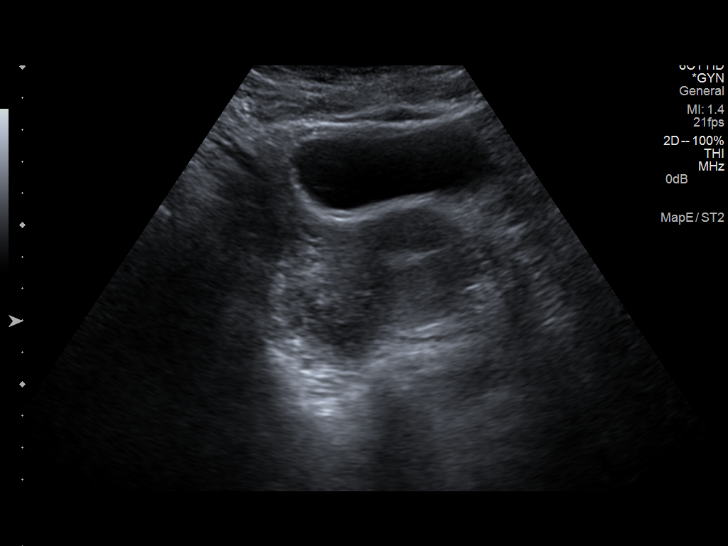
[im 21/84]
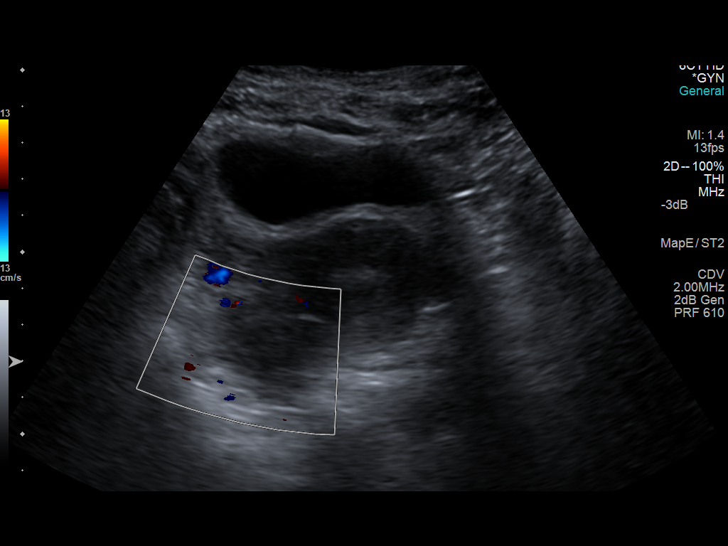
[im 28/84]
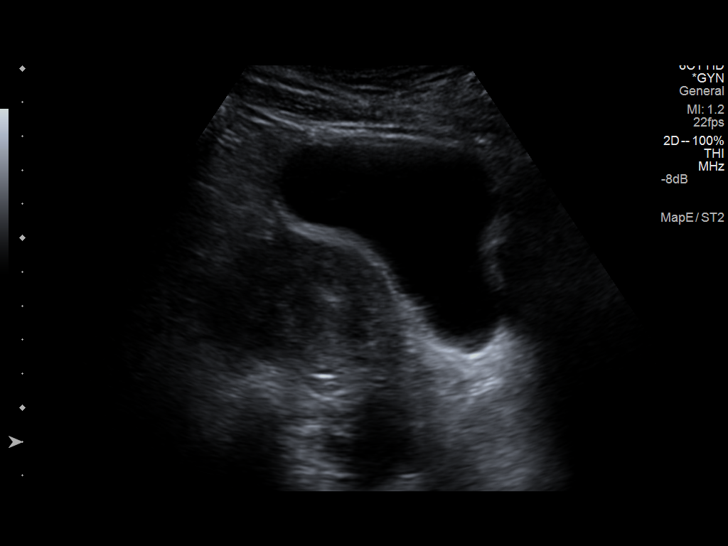
[im 35/84]
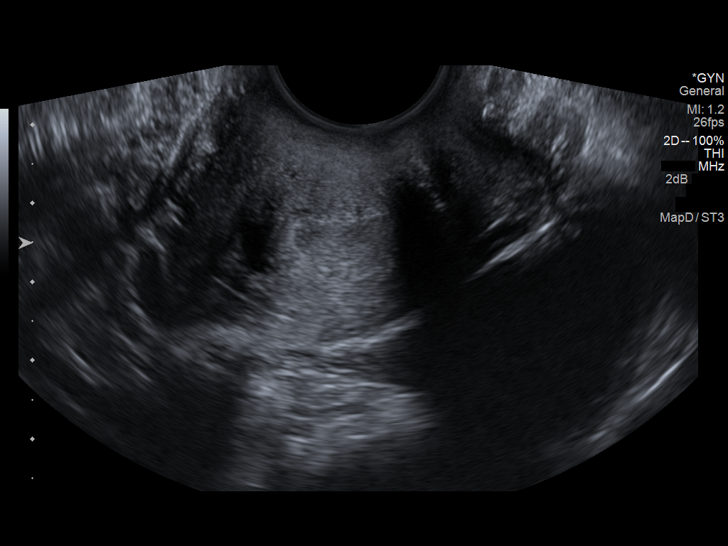
[im 42/84]
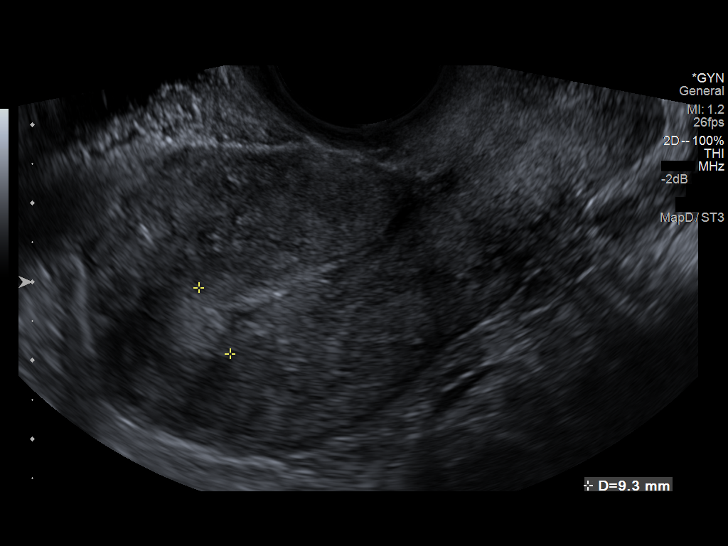
[im 49/84]
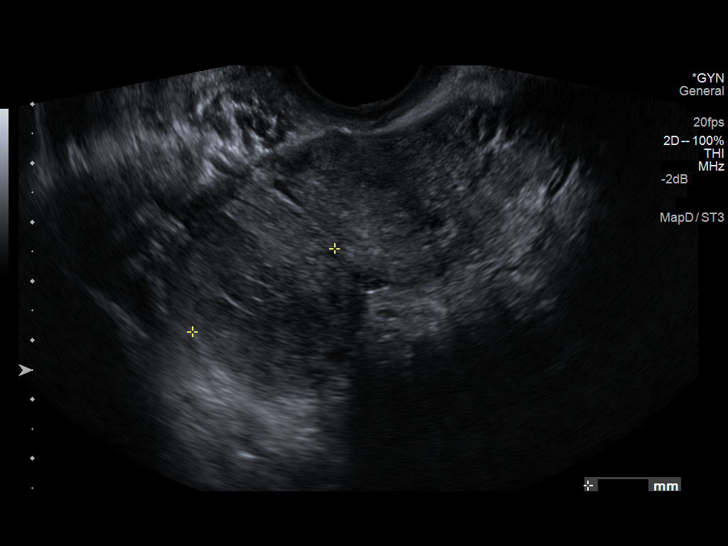
[im 56/84]
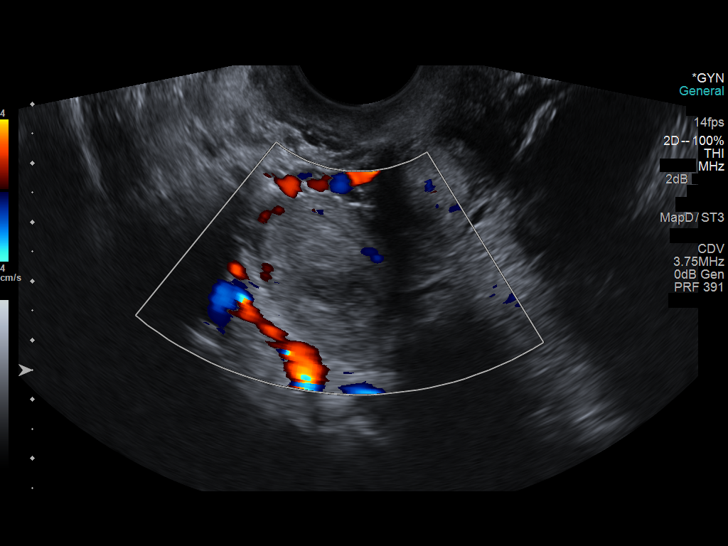
[im 63/84]
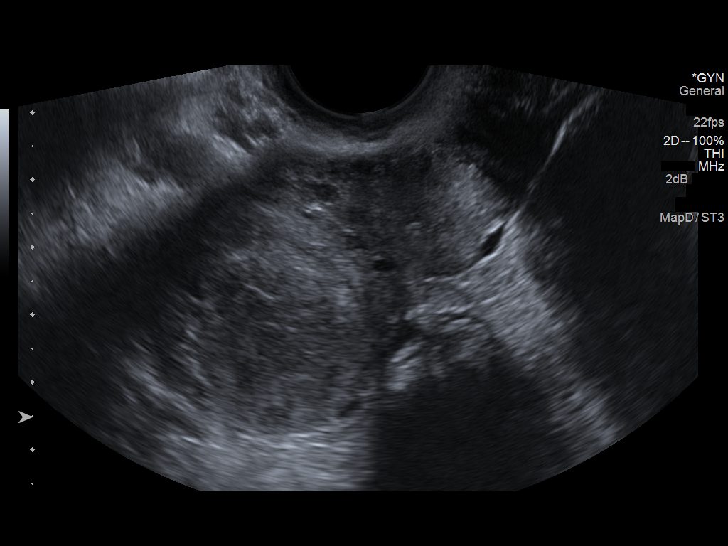
[im 70/84]
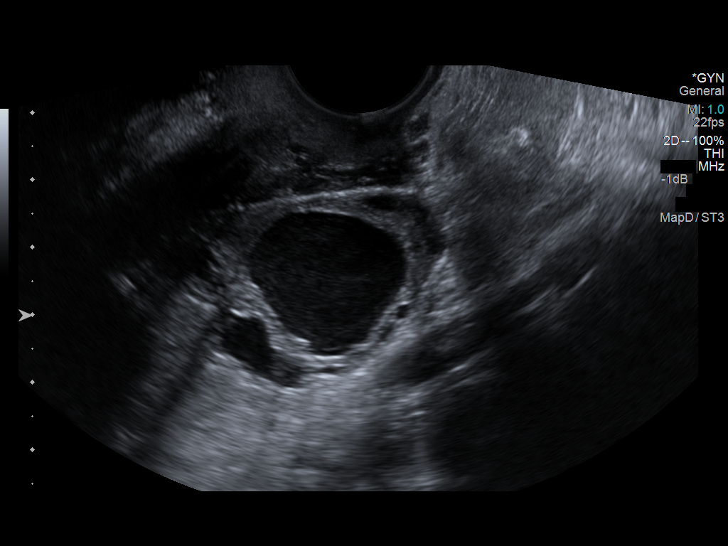
[im 77/84]
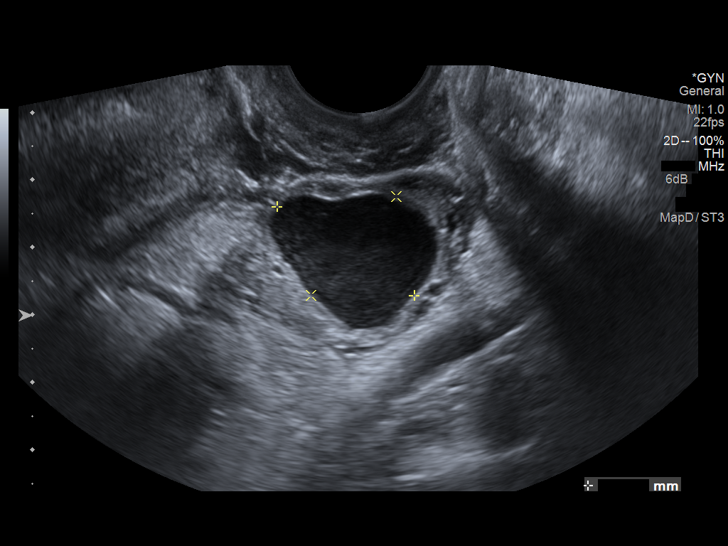
[im 84/84]
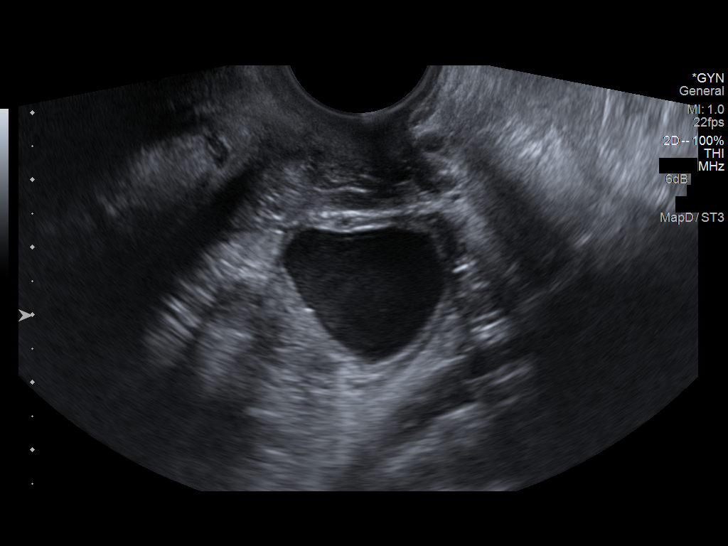

[13 of 25 positions shown; findings below may reference images not displayed]

FINDINGS: Uterus

Measurements: 7.5 x 4.1 x 5.1 cm. No fibroids or other mass
visualized.

Endometrium

Thickness: 9 mm.  No focal abnormality visualized.

Right ovary

Measurements: 4.0 x 3.8 x 2.8 cm. Hypoechoic area within the right
ovary measures 2.9 x 2.8 x 2.0 cm

Left ovary

Measurements: 3.5 x 3.1 x 3.7 cm. 2.4 cm cyst with internal echoes.

Pulsed Doppler evaluation of both ovaries demonstrates normal
low-resistance arterial and venous waveforms.

Other findings

No free fluid.
IMPRESSION: 2.9 cm hypoechoic lesion within the right ovary, favor endometrioma.
This could be followed with repeat ultrasound in 6-12 weeks.

2.4 cm hemorrhagic cyst within the left ovary. Again, this could be
followed with repeat ultrasound in 6-12 weeks.

No evidence of ovarian torsion.

## 2016-06-08 ENCOUNTER — Ambulatory Visit: Payer: Medicaid Other

## 2016-06-08 ENCOUNTER — Ambulatory Visit: Payer: Medicaid Other | Admitting: Obstetrics & Gynecology

## 2016-08-14 ENCOUNTER — Ambulatory Visit: Payer: Medicaid Other | Admitting: Family

## 2016-08-29 ENCOUNTER — Ambulatory Visit: Payer: Medicaid Other | Admitting: Obstetrics & Gynecology

## 2016-11-13 ENCOUNTER — Other Ambulatory Visit: Payer: Self-pay | Admitting: Obstetrics & Gynecology

## 2016-11-13 ENCOUNTER — Encounter (HOSPITAL_COMMUNITY): Payer: Self-pay | Admitting: Emergency Medicine

## 2016-11-13 ENCOUNTER — Ambulatory Visit (HOSPITAL_COMMUNITY)
Admission: EM | Admit: 2016-11-13 | Discharge: 2016-11-13 | Disposition: A | Discharge status: Home / Self Care | Payer: Medicaid Other | Attending: Internal Medicine | Admitting: Internal Medicine

## 2016-11-13 DIAGNOSIS — Z818 Family history of other mental and behavioral disorders: Secondary | ICD-10-CM | POA: Diagnosis not present

## 2016-11-13 DIAGNOSIS — A6000 Herpesviral infection of urogenital system, unspecified: Secondary | ICD-10-CM | POA: Diagnosis not present

## 2016-11-13 DIAGNOSIS — B9689 Other specified bacterial agents as the cause of diseases classified elsewhere: Secondary | ICD-10-CM

## 2016-11-13 DIAGNOSIS — Z3202 Encounter for pregnancy test, result negative: Secondary | ICD-10-CM | POA: Diagnosis not present

## 2016-11-13 DIAGNOSIS — Z87442 Personal history of urinary calculi: Secondary | ICD-10-CM | POA: Diagnosis not present

## 2016-11-13 DIAGNOSIS — J45909 Unspecified asthma, uncomplicated: Secondary | ICD-10-CM | POA: Diagnosis not present

## 2016-11-13 DIAGNOSIS — F17210 Nicotine dependence, cigarettes, uncomplicated: Secondary | ICD-10-CM | POA: Insufficient documentation

## 2016-11-13 DIAGNOSIS — N898 Other specified noninflammatory disorders of vagina: Secondary | ICD-10-CM

## 2016-11-13 DIAGNOSIS — N760 Acute vaginitis: Secondary | ICD-10-CM

## 2016-11-13 DIAGNOSIS — Z8249 Family history of ischemic heart disease and other diseases of the circulatory system: Secondary | ICD-10-CM | POA: Diagnosis not present

## 2016-11-13 DIAGNOSIS — Z825 Family history of asthma and other chronic lower respiratory diseases: Secondary | ICD-10-CM | POA: Insufficient documentation

## 2016-11-13 DIAGNOSIS — I10 Essential (primary) hypertension: Secondary | ICD-10-CM | POA: Insufficient documentation

## 2016-11-13 LAB — POCT URINALYSIS DIP (DEVICE)
BILIRUBIN URINE: NEGATIVE
GLUCOSE, UA: NEGATIVE mg/dL
Hgb urine dipstick: NEGATIVE
Ketones, ur: NEGATIVE mg/dL
LEUKOCYTES UA: NEGATIVE
NITRITE: NEGATIVE
Protein, ur: NEGATIVE mg/dL
Specific Gravity, Urine: 1.02 (ref 1.005–1.030)
UROBILINOGEN UA: 0.2 mg/dL (ref 0.0–1.0)
pH: 6.5 (ref 5.0–8.0)

## 2016-11-13 LAB — POCT PREGNANCY, URINE: Preg Test, Ur: NEGATIVE

## 2016-11-13 MED ORDER — METRONIDAZOLE 0.75 % VA GEL: VAGINAL | 0 refills | Status: DC

## 2016-11-13 MED ORDER — VALACYCLOVIR HCL 1 G PO TABS
1000.0000 mg | ORAL_TABLET | Freq: Every day | ORAL | 0 refills | Status: DC
Start: 2016-11-13 — End: 2018-06-15

## 2016-11-13 MED ORDER — FLUCONAZOLE 150 MG PO TABS: ORAL_TABLET | ORAL | 0 refills | Status: DC

## 2016-11-13 NOTE — Discharge Instructions (Signed)
You are being treated with MetroGel V and Diflucan. Testing for other causes of infection are in progress. If within the next 24-48 hours we received a positive result of an infection that you have not been treated for with call you and likely be able to treat over the phone.

## 2016-11-13 NOTE — ED Provider Notes (Signed)
CSN: [id number]     Arrival date & time 11/13/16  1318 History   First MD Initiated Contact with Patient 11/13/16 1601     Chief Complaint  Patient presents with  . Vaginal Discharge  . Abdominal Cramping   (Consider location/radiation/quality/duration/timing/severity/associated sxs/prior Treatment) 31 year old female complaining of vaginal discharge for 2 weeks and getting worse. She is also having a crampy pelvic feeling. She recently finished her menstrual period. She also has a history of frequent outbreaks of genital herpes and is feeling an outbreak coming on now. She specifically mentions tingling feeling where her usual lesions occur. Denies current lesions.      Past Medical History:  Diagnosis Date  . Asthma   . Deliberate self-cutting   . Endometriosis   . Epistaxis   . Hypertension   . Kidney stones   . Kidney stones    Past Surgical History:  Procedure Laterality Date  . CHOLECYSTECTOMY     Family History  Problem Relation Age of Onset  . COPD Mother   . Depression Mother   . Hypertension Mother   . Hypertension Father   . Drug abuse Brother   . Cancer Maternal Aunt   . Hyperlipidemia Maternal Uncle   . Miscarriages / Stillbirths Maternal Uncle   . Diabetes Maternal Grandmother   . Miscarriages / Stillbirths Paternal Grandmother    Social History  Substance Use Topics  . Smoking status: Current Every Day Smoker    Packs/day: 0.00    Years: 0.00    Types: Cigarettes  . Smokeless tobacco: Never Used  . Alcohol use Yes   OB History    Gravida Para Term Preterm AB Living   1 1   1   1    SAB TAB Ectopic Multiple Live Births                 Review of Systems  Constitutional: Negative.   Respiratory: Negative.   Gastrointestinal: Negative.   Genitourinary: Positive for pelvic pain and vaginal discharge. Negative for dysuria, frequency and menstrual problem.  Musculoskeletal: Negative.   Skin: Negative.   Neurological: Negative.   All other  systems reviewed and are negative.   Allergies  Other  Home Medications   Prior to Admission medications   Medication Sig Start Date End Date Taking? Authorizing Provider  fluconazole (DIFLUCAN) 150 MG tablet 1 tab po x 1. May repeat in 72 hours if no improvement 11/13/16   Janne Napoleon, NP  metroNIDAZOLE (METROGEL) 0.75 % vaginal gel 1 applicator full pv bid x 5 days 11/13/16   Janne Napoleon, NP  valACYclovir (VALTREX) 1000 MG tablet Take 1 tablet (1,000 mg total) by mouth daily. 11/13/16   Janne Napoleon, NP   Meds Ordered and Administered this Visit  Medications - No data to display  BP 146/98 (BP Location: Right Arm)   Pulse 84   Temp 98.5 F (36.9 C) (Oral)   Resp 18   LMP 11/06/2016 (Exact Date)   SpO2 99%  No data found.   Physical Exam  Constitutional: She is oriented to person, place, and time. She appears well-developed and well-nourished. No distress.  Eyes: EOM are normal.  Neck: Neck supple.  Cardiovascular: Normal rate.   Pulmonary/Chest: Effort normal. No respiratory distress.  Genitourinary:  Genitourinary Comments: Normal external female genitalia. No external lesions are seen. The cervix is posterior and slightly right of midline. The upper ectocervix is pink. The lower cervix was several scarlike structures and light superficial erythema. Clear and  gray tight discharge in the vaginal vault. The patient's body habitus, along with maintaining abduction of the legs but it difficult to perform the exam and difficult to approach for a bimanual. Bimanual was not performed. The cervix was mobilized with a large Fox swab and the patient denied motion tenderness.  Musculoskeletal: She exhibits no edema.  Neurological: She is alert and oriented to person, place, and time. She exhibits normal muscle tone.  Skin: Skin is warm and dry.  Psychiatric: She has a normal mood and affect.  Nursing note and vitals reviewed.   Urgent Care Course     Procedures (including critical care  time)  Labs Review Labs Reviewed  POCT URINALYSIS DIP (DEVICE)  POCT PREGNANCY, URINE  CERVICOVAGINAL ANCILLARY ONLY   Results for orders placed or performed during the hospital encounter of 11/13/16  POCT urinalysis dip (device)  Result Value Ref Range   Glucose, UA NEGATIVE NEGATIVE mg/dL   Bilirubin Urine NEGATIVE NEGATIVE   Ketones, ur NEGATIVE NEGATIVE mg/dL   Specific Gravity, Urine 1.020 1.005 - 1.030   Hgb urine dipstick NEGATIVE NEGATIVE   pH 6.5 5.0 - 8.0   Protein, ur NEGATIVE NEGATIVE mg/dL   Urobilinogen, UA 0.2 0.0 - 1.0 mg/dL   Nitrite NEGATIVE NEGATIVE   Leukocytes, UA NEGATIVE NEGATIVE  Pregnancy, urine POC  Result Value Ref Range   Preg Test, Ur NEGATIVE NEGATIVE     Imaging Review No results found.   Visual Acuity Review  Right Eye Distance:   Left Eye Distance:   Bilateral Distance:    Right Eye Near:   Left Eye Near:    Bilateral Near:         MDM   1. BV (bacterial vaginosis)   2. Acute vaginitis   3. Vaginal discharge   4. Herpes simplex infection of genitourinary system   You are being treated with MetroGel V and Diflucan. Testing for other causes of infection are in progress. If within the next 24-48 hours we received a positive result of an infection that you have not been treated for with call you and likely be able to treat over the phone. Meds ordered this encounter  Medications  . metroNIDAZOLE (METROGEL) 0.75 % vaginal gel    Sig: 1 applicator full pv bid x 5 days    Dispense:  70 g    Refill:  0    Order Specific Question:   Supervising Provider    Answer:   Sherlene Shams [[id number]]  . fluconazole (DIFLUCAN) 150 MG tablet    Sig: 1 tab po x 1. May repeat in 72 hours if no improvement    Dispense:  2 tablet    Refill:  0    Order Specific Question:   Supervising Provider    Answer:   Sherlene Shams [[id number]]  . valACYclovir (VALTREX) 1000 MG tablet    Sig: Take 1 tablet (1,000 mg total) by mouth daily.    Dispense:  5  tablet    Refill:  0    Order Specific Question:   Supervising Provider    Answer:   Sherlene Shams [[id number]]        Janne Napoleon, NP 11/13/16 1625    Janne Napoleon, NP 11/13/16 [phone]

## 2016-11-13 NOTE — ED Triage Notes (Signed)
The patient presented to the [facility] with a complaint of a vaginal discharge and lower abdominal cramping x 2 weeks. The patient also stated that she needed a refill on her valtrex as she can feel an out break oncoming.

## 2016-11-13 NOTE — ED Notes (Signed)
Called to Room 2, no answer x1.

## 2016-11-14 LAB — CERVICOVAGINAL ANCILLARY ONLY
Chlamydia: NEGATIVE
NEISSERIA GONORRHEA: NEGATIVE
WET PREP (BD AFFIRM): POSITIVE — AB

## 2016-12-07 ENCOUNTER — Ambulatory Visit: Payer: Medicaid Other | Admitting: Obstetrics & Gynecology

## 2017-03-11 DIAGNOSIS — I10 Essential (primary) hypertension: Secondary | ICD-10-CM | POA: Insufficient documentation

## 2017-03-11 DIAGNOSIS — N710 Acute inflammatory disease of uterus: Secondary | ICD-10-CM | POA: Insufficient documentation

## 2017-05-20 DIAGNOSIS — J45909 Unspecified asthma, uncomplicated: Secondary | ICD-10-CM | POA: Insufficient documentation

## 2017-10-01 ENCOUNTER — Encounter (HOSPITAL_COMMUNITY): Payer: Self-pay | Admitting: Emergency Medicine

## 2017-10-01 ENCOUNTER — Emergency Department (HOSPITAL_COMMUNITY)
Admission: EM | Admit: 2017-10-01 | Discharge: 2017-10-01 | Disposition: A | Discharge status: Home / Self Care | Payer: Medicaid Other | Attending: Emergency Medicine | Admitting: Emergency Medicine

## 2017-10-01 ENCOUNTER — Emergency Department (HOSPITAL_COMMUNITY): Payer: Medicaid Other

## 2017-10-01 DIAGNOSIS — B9789 Other viral agents as the cause of diseases classified elsewhere: Secondary | ICD-10-CM

## 2017-10-01 DIAGNOSIS — I10 Essential (primary) hypertension: Secondary | ICD-10-CM | POA: Insufficient documentation

## 2017-10-01 DIAGNOSIS — M940 Chondrocostal junction syndrome [Tietze]: Secondary | ICD-10-CM | POA: Insufficient documentation

## 2017-10-01 DIAGNOSIS — J069 Acute upper respiratory infection, unspecified: Secondary | ICD-10-CM

## 2017-10-01 DIAGNOSIS — J45909 Unspecified asthma, uncomplicated: Secondary | ICD-10-CM | POA: Diagnosis not present

## 2017-10-01 DIAGNOSIS — F17210 Nicotine dependence, cigarettes, uncomplicated: Secondary | ICD-10-CM | POA: Diagnosis not present

## 2017-10-01 DIAGNOSIS — R05 Cough: Secondary | ICD-10-CM | POA: Diagnosis present

## 2017-10-01 LAB — I-STAT BETA HCG BLOOD, ED (MC, WL, AP ONLY)

## 2017-10-01 LAB — BASIC METABOLIC PANEL
Anion gap: 13 (ref 5–15)
BUN: 7 mg/dL (ref 6–20)
CO2: 24 mmol/L (ref 22–32)
Calcium: 9.1 mg/dL (ref 8.9–10.3)
Chloride: 101 mmol/L (ref 101–111)
Creatinine, Ser: 1.25 mg/dL — ABNORMAL HIGH (ref 0.44–1.00)
GFR calc Af Amer: >60 mL/min (ref >60)
GFR, EST NON AFRICAN AMERICAN: 57 mL/min — AB (ref >60)
Glucose, Bld: 92 mg/dL (ref 65–99)
POTASSIUM: 3.4 mmol/L — AB (ref 3.5–5.1)
SODIUM: 138 mmol/L (ref 135–145)

## 2017-10-01 LAB — CBC
HEMATOCRIT: 42.9 % (ref 36.0–46.0)
Hemoglobin: 14.6 g/dL (ref 12.0–15.0)
MCH: 28.3 pg (ref 26.0–34.0)
MCHC: 34 g/dL (ref 30.0–36.0)
MCV: 83.1 fL (ref 78.0–100.0)
Platelets: 268 10*3/uL (ref 150–400)
RBC: 5.16 MIL/uL — ABNORMAL HIGH (ref 3.87–5.11)
RDW: 13.7 % (ref 11.5–15.5)
WBC: 6.3 10*3/uL (ref 4.0–10.5)

## 2017-10-01 LAB — I-STAT TROPONIN, ED: Troponin i, poc: 0 ng/mL (ref 0.00–0.08)

## 2017-10-01 MED ORDER — BENZONATATE 100 MG PO CAPS: 100.0000 mg | ORAL_CAPSULE | Freq: Three times a day (TID) | ORAL | 0 refills | Status: DC | PRN

## 2017-10-01 MED ORDER — ALBUTEROL SULFATE (2.5 MG/3ML) 0.083% IN NEBU
5.0000 mg | INHALATION_SOLUTION | Freq: Once | RESPIRATORY_TRACT | Status: AC
  Administered 2017-10-01: 5 mg via RESPIRATORY_TRACT
  Filled 2017-10-01: qty 6

## 2017-10-01 MED ORDER — ALBUTEROL SULFATE HFA 108 (90 BASE) MCG/ACT IN AERS
2.0000 | INHALATION_SPRAY | Freq: Once | RESPIRATORY_TRACT | Status: AC
  Administered 2017-10-01: 2 via RESPIRATORY_TRACT
  Filled 2017-10-01: qty 6.7

## 2017-10-01 MED ORDER — BENZONATATE 100 MG PO CAPS
200.0000 mg | ORAL_CAPSULE | Freq: Once | ORAL | Status: AC
  Administered 2017-10-01: 200 mg via ORAL
  Filled 2017-10-01: qty 2

## 2017-10-01 MED ORDER — PREDNISONE 20 MG PO TABS
60.0000 mg | ORAL_TABLET | Freq: Once | ORAL | Status: AC
Start: 2017-10-01 — End: 2017-10-01
  Administered 2017-10-01: 60 mg via ORAL
  Filled 2017-10-01: qty 3

## 2017-10-01 MED ORDER — PREDNISONE 20 MG PO TABS: 40.0000 mg | ORAL_TABLET | Freq: Every day | ORAL | 0 refills | Status: DC

## 2017-10-01 MED ORDER — IPRATROPIUM BROMIDE 0.02 % IN SOLN
0.5000 mg | Freq: Once | RESPIRATORY_TRACT | Status: AC
  Administered 2017-10-01: 0.5 mg via RESPIRATORY_TRACT
  Filled 2017-10-01: qty 2.5

## 2017-10-01 NOTE — ED Provider Notes (Signed)
[place] EMERGENCY DEPARTMENT Provider Note   CSN: [id number] Arrival date & time: 10/01/17  1659     History   Chief Complaint Chief Complaint  Patient presents with  . Cough  . URI  . Chest Pain    HPI Samantha Yoder is a 32 y.o. female.   32 year old female presents to the emergency department for evaluation of cough.  She reports cough for 3 days which has been productive of grayish green mucus.  She has since developed some pain to her right mid back.  This is worse with coughing and deep breathing as well as with palpation.  She feels short of breath with exertion.  No medications taken prior to arrival for symptoms.  She is a daily smoker and states prior history of asthma.  She has not used an inhaler in quite some time.  She denies any known sick contacts.  No associated fevers or hemoptysis.  No recent surgeries or hospitalizations.  The patient is not on daily birth control pills.      Past Medical History:  Diagnosis Date  . Asthma   . Deliberate self-cutting   . Endometriosis   . Epistaxis   . Hypertension   . Kidney stones   . Kidney stones     Patient Active Problem List   Diagnosis Date Noted  . Endometriosis determined by laparoscopy 03/23/2016  . Pelvic pain in female 01/05/2015  . 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 CHOLECYSTECTOMY      OB History    Gravida Para Term Preterm AB Living   1 1   1   1    SAB TAB Ectopic Multiple Live Births                   Home Medications    Prior to Admission medications   Medication Sig Start Date End Date Taking? Authorizing Provider  benzonatate (TESSALON) 100 MG capsule Take 1-2 capsules (100-200 mg total) by mouth 3 (three) times daily as needed for cough. 10/01/17   Antonietta Breach, PA-C  fluconazole (DIFLUCAN) 150 MG tablet 1 tab po x 1. May repeat in 72 hours if no  improvement Patient not taking: Reported on 10/01/2017 11/13/16   Janne Napoleon, NP  metroNIDAZOLE (METROGEL) 0.75 % vaginal gel insert 1 applicatorful vaginally twice a day Patient not taking: Reported on 10/01/2017 11/15/16   Woodroe Mode, MD  metroNIDAZOLE (METROGEL) 0.75 % vaginal gel 1 applicator full pv bid x 5 days Patient not taking: Reported on 10/01/2017 11/13/16   Janne Napoleon, NP  predniSONE (DELTASONE) 20 MG tablet Take 2 tablets (40 mg total) by mouth daily. 10/01/17   Antonietta Breach, PA-C  valACYclovir (VALTREX) 1000 MG tablet Take 1 tablet (1,000 mg total) by mouth daily. Patient not taking: Reported on 10/01/2017 11/13/16   Janne Napoleon, NP    Family History Family History  Problem Relation Age of Onset  . COPD Mother   . Depression Mother   . Hypertension Mother   . Hypertension Father   . Drug abuse Brother   . Cancer Maternal Aunt   . Hyperlipidemia Maternal Uncle   . Miscarriages / Stillbirths Maternal Uncle   . Diabetes Maternal Grandmother   . Miscarriages / Stillbirths Paternal Grandmother     Social History Social History   Tobacco Use  . Smoking status: Current Every Day Smoker  Packs/day: 0.50    Years: 0.00    Pack years: 0.00    Types: Cigarettes  . Smokeless tobacco: Never Used  Substance Use Topics  . Alcohol use: Yes  . Drug use: No     Allergies   Other   Review of Systems Review of Systems Ten systems reviewed and are negative for acute change, except as noted in the HPI.    Physical Exam Updated Vital Signs BP (!) 158/98   Pulse 85   Temp 99.4 F (37.4 C) (Oral)   Resp 20   Ht 4\' 11"  (1.499 m)   Wt 83.9 kg (185 lb)   LMP 09/19/2017   SpO2 98%   BMI 37.37 kg/m   Physical Exam  Constitutional: She is oriented to person, place, and time. She appears well-developed and well-nourished. No distress.  Nontoxic appearing and in NAD  HENT:  Head: Normocephalic and atraumatic.  Eyes: Conjunctivae and EOM are normal. No scleral  icterus.  Neck: Normal range of motion.  Cardiovascular: Normal rate, regular rhythm and intact distal pulses.  Pulmonary/Chest: Effort normal. No stridor. No respiratory distress. She has no wheezes. She has no rales.  Respirations even and unlabored. Lungs CTAB. Mild TTP to the right thoracic paraspinal muscles.  Musculoskeletal: Normal range of motion.  Neurological: She is alert and oriented to person, place, and time. She exhibits normal muscle tone. Coordination normal.  GCS 15. Moving all extremities.  Skin: Skin is warm and dry. No rash noted. She is not diaphoretic. No erythema. No pallor.  Psychiatric: She has a normal mood and affect. Her behavior is normal.  Nursing note and vitals reviewed.    ED Treatments / Results  Labs (all labs ordered are listed, but only abnormal results are displayed) Labs Reviewed  BASIC METABOLIC PANEL - Abnormal; Notable for the following components:      Result Value   Potassium 3.4 (*)    Creatinine, Ser 1.25 (*)    GFR calc non Af Amer 57 (*)    All other components within normal limits  CBC - Abnormal; Notable for the following components:   RBC 5.16 (*)    All other components within normal limits  I-STAT TROPONIN, ED  I-STAT BETA HCG BLOOD, ED ([facility], [facility], [facility] ONLY)    EKG  EKG Interpretation  Date/Time:  Tuesday October 01 2017 17:33:55 EST Ventricular Rate:  84 PR Interval:  166 QRS Duration: 88 QT Interval:  372 QTC Calculation: 439 R Axis:   67 Text Interpretation:  Normal sinus rhythm Normal ECG No significant change since last tracing Confirmed by Duffy Bruce [phone]) on 10/01/2017 10:56:42 PM       Radiology Dg Chest 2 View  Result Date: 10/01/2017 CLINICAL DATA:  Right sided CP, worse upon movement for several days; Cough, with grayish green mucus; EXAM: CHEST  2 VIEW COMPARISON:  04/20/2016 FINDINGS: The heart size and mediastinal contours are within normal limits. Both lungs are clear. No pleural effusion or  pneumothorax. The visualized skeletal structures are unremarkable. IMPRESSION: Normal chest radiographs. Electronically Signed   By: Lajean Manes M.D.   On: 10/01/2017 18:47    Procedures Procedures (including critical care time)  Medications Ordered in ED Medications  albuterol (PROVENTIL HFA;VENTOLIN HFA) 108 (90 Base) MCG/ACT inhaler 2 puff (not administered)  albuterol (PROVENTIL) (2.5 MG/3ML) 0.083% nebulizer solution 5 mg (5 mg Nebulization Given 10/01/17 1739)  predniSONE (DELTASONE) tablet 60 mg (60 mg Oral Given 10/01/17 2154)  albuterol (PROVENTIL) (2.5  MG/3ML) 0.083% nebulizer solution 5 mg (5 mg Nebulization Given 10/01/17 2156)  ipratropium (ATROVENT) nebulizer solution 0.5 mg (0.5 mg Nebulization Given 10/01/17 2156)  benzonatate (TESSALON) capsule 200 mg (200 mg Oral Given 10/01/17 2154)    10:53 PM Patient ambulatory in the ED without hypoxia.   Initial Impression / Assessment and Plan / ED Course  I have reviewed the triage vital signs and the nursing notes.  Pertinent labs & imaging results that were available during my care of the patient were reviewed by me and considered in my medical decision making (see chart for details).     Pt CXR negative for acute infiltrate. Patient's symptoms are consistent with URI, likely viral etiology, with secondary costochondritis. Discussed that antibiotics are not indicated for viral infections. Patient will be discharged with symptomatic treatment.  Return precautions discussed and provided. Patient discharged in stable condition with no unaddressed concerns.   Final Clinical Impressions(s) / ED Diagnoses   Final diagnoses:  Viral URI with cough  Costochondritis    ED Discharge Orders        Ordered    predniSONE (DELTASONE) 20 MG tablet  Daily     10/01/17 2234    benzonatate (TESSALON) 100 MG capsule  3 times daily PRN     10/01/17 2234       Antonietta Breach, PA-C 10/01/17 2257    Duffy Bruce, MD 10/02/17  [phone]

## 2017-10-01 NOTE — ED Notes (Signed)
Pt. Ambulated on pulse ox, and saturation remained at 97 percent while ambulating.

## 2017-10-01 NOTE — Discharge Instructions (Signed)
We recommend 2 puffs of an albuterol inhaler every 4-6 hours as needed for cough, wheezing, shortness of breath.  Continue prednisone as prescribed until finished.  You may take Tessalon as prescribed for cough.  We recommend Tylenol 1000 mg every 8 hours for chest discomfort.  You may also take over-the-counter medications for symptoms, if desired.  Follow-up with your primary care doctor to ensure resolution of symptoms.

## 2017-10-01 NOTE — ED Notes (Signed)
Pt. Refused to get into a gown.

## 2017-10-01 NOTE — ED Triage Notes (Signed)
Pt states she has been having a cough since Saturday night and reports grayish green mucus, shortness of breath, and pain on the right side of her chest worsening with movement.

## 2017-10-07 DIAGNOSIS — A6000 Herpesviral infection of urogenital system, unspecified: Secondary | ICD-10-CM | POA: Insufficient documentation

## 2018-06-15 ENCOUNTER — Ambulatory Visit (HOSPITAL_COMMUNITY)
Admission: EM | Admit: 2018-06-15 | Discharge: 2018-06-15 | Disposition: A | Discharge status: Home / Self Care | Payer: Medicaid Other | Attending: Family Medicine | Admitting: Family Medicine

## 2018-06-15 ENCOUNTER — Encounter (HOSPITAL_COMMUNITY): Payer: Self-pay | Admitting: Emergency Medicine

## 2018-06-15 DIAGNOSIS — R1032 Left lower quadrant pain: Secondary | ICD-10-CM | POA: Insufficient documentation

## 2018-06-15 DIAGNOSIS — N809 Endometriosis, unspecified: Secondary | ICD-10-CM | POA: Insufficient documentation

## 2018-06-15 DIAGNOSIS — N83201 Unspecified ovarian cyst, right side: Secondary | ICD-10-CM | POA: Insufficient documentation

## 2018-06-15 DIAGNOSIS — Z791 Long term (current) use of non-steroidal anti-inflammatories (NSAID): Secondary | ICD-10-CM | POA: Insufficient documentation

## 2018-06-15 DIAGNOSIS — Z87442 Personal history of urinary calculi: Secondary | ICD-10-CM | POA: Insufficient documentation

## 2018-06-15 DIAGNOSIS — M545 Low back pain: Secondary | ICD-10-CM | POA: Insufficient documentation

## 2018-06-15 DIAGNOSIS — N898 Other specified noninflammatory disorders of vagina: Secondary | ICD-10-CM | POA: Insufficient documentation

## 2018-06-15 DIAGNOSIS — Z3202 Encounter for pregnancy test, result negative: Secondary | ICD-10-CM

## 2018-06-15 DIAGNOSIS — Z79899 Other long term (current) drug therapy: Secondary | ICD-10-CM | POA: Insufficient documentation

## 2018-06-15 DIAGNOSIS — Z9049 Acquired absence of other specified parts of digestive tract: Secondary | ICD-10-CM | POA: Insufficient documentation

## 2018-06-15 DIAGNOSIS — F17210 Nicotine dependence, cigarettes, uncomplicated: Secondary | ICD-10-CM | POA: Insufficient documentation

## 2018-06-15 DIAGNOSIS — R1031 Right lower quadrant pain: Secondary | ICD-10-CM | POA: Insufficient documentation

## 2018-06-15 DIAGNOSIS — N83202 Unspecified ovarian cyst, left side: Secondary | ICD-10-CM | POA: Insufficient documentation

## 2018-06-15 DIAGNOSIS — R3129 Other microscopic hematuria: Secondary | ICD-10-CM | POA: Insufficient documentation

## 2018-06-15 LAB — POCT URINALYSIS DIP (DEVICE)
BILIRUBIN URINE: NEGATIVE
GLUCOSE, UA: NEGATIVE mg/dL
KETONES UR: NEGATIVE mg/dL
LEUKOCYTES UA: NEGATIVE
Nitrite: NEGATIVE
Protein, ur: NEGATIVE mg/dL
SPECIFIC GRAVITY, URINE: 1.02 (ref 1.005–1.030)
Urobilinogen, UA: 0.2 mg/dL (ref 0.0–1.0)
pH: 6 (ref 5.0–8.0)

## 2018-06-15 LAB — POCT PREGNANCY, URINE: Preg Test, Ur: NEGATIVE

## 2018-06-15 MED ORDER — KETOROLAC TROMETHAMINE 30 MG/ML IJ SOLN
INTRAMUSCULAR | Status: AC
  Filled 2018-06-15: qty 1

## 2018-06-15 MED ORDER — KETOROLAC TROMETHAMINE 30 MG/ML IJ SOLN
30.0000 mg | Freq: Once | INTRAMUSCULAR | Status: AC
  Administered 2018-06-15: 30 mg via INTRAMUSCULAR

## 2018-06-15 MED ORDER — HYDROCODONE-ACETAMINOPHEN 5-325 MG PO TABS: 1.0000 | ORAL_TABLET | Freq: Four times a day (QID) | ORAL | 0 refills | Status: DC | PRN

## 2018-06-15 MED ORDER — METRONIDAZOLE 0.75 % VA GEL: 1.0000 | Freq: Every day | VAGINAL | 0 refills | Status: AC

## 2018-06-15 MED ORDER — MELOXICAM 7.5 MG PO TABS: 7.5000 mg | ORAL_TABLET | Freq: Every day | ORAL | 0 refills | Status: DC

## 2018-06-15 MED ORDER — TAMSULOSIN HCL 0.4 MG PO CAPS: 0.4000 mg | ORAL_CAPSULE | Freq: Every day | ORAL | 0 refills | Status: DC

## 2018-06-15 MED ORDER — FLUCONAZOLE 150 MG PO TABS: 150.0000 mg | ORAL_TABLET | Freq: Every day | ORAL | 0 refills | Status: DC

## 2018-06-15 NOTE — Discharge Instructions (Addendum)
Your urine showed blood in the urine, without infection.  Pregnancy test negative.  As discussed, cannot rule out appendicitis, ovarian cyst causing symptoms.  However, given your history and exam, symptoms could be due to a kidney stone.  Please start Flomax as directed to help with symptoms.  Toradol injection in office today.  Start Mobic for pain, you can take Norco for breakthrough pain.  MetroGel for BV, I have also provide Diflucan for possible yeast infection if you start having worsening symptoms of itching after MetroGel use. Cytology sent, you will be contacted with any positive results that requires further treatment. Refrain from sexual activity and alcohol use for the next 7 days.  As discussed, please monitor for worsening symptoms, trouble walking/jumping due to pain, nausea, vomiting, fever, go to the emergency department for further evaluation.

## 2018-06-15 NOTE — ED Provider Notes (Signed)
[place]    CSN: [id number] Arrival date & time: 06/15/18  1633     History   Chief Complaint Chief Complaint  Patient presents with  . Abdominal Pain  . Vaginal Discharge    HPI Samantha Yoder is a 32 y.o. female.   32 year old female with history of endometriosis, ovarian cyst, kidney stones comes in for 4 day history of abdominal pain. States pain is low/suprapubic pain that can radiate to the RLQ, right lower back and hip. Pain is constant but waxes and wanes in intensity. States movement and cold makes symptoms worse. Denies nausea, vomiting. Denies fever, chills, night sweats. Still eating and drinking without difficulty. Last BM today without straining. Denies urinary symptoms such as frequency, dysuria, hematuria. Did noticed vaginal discharge started 1-2 days ago with some odor/itching. LMP 05/30/2018. Sexually active with 1 female partner, no condom use. No birth control use. Has taken tylenol/naproxen with mild relief.      Past Medical History:  Diagnosis Date  . Asthma   . Deliberate self-cutting   . Endometriosis   . Epistaxis   . Hypertension   . Kidney stones   . Kidney stones     Patient Active Problem List   Diagnosis Date Noted  . Endometriosis determined by laparoscopy 03/23/2016  . Pelvic pain in female 01/05/2015  . MDD (major depressive disorder), recurrent severe, without psychosis ([place]) 12/20/2014    Past Surgical History:  Procedure Laterality Date  . CHOLECYSTECTOMY      OB History    Gravida  1   Para  1   Term      Preterm  1   AB      Living  1     SAB      TAB      Ectopic      Multiple      Live Births               Home Medications    Prior to Admission medications   Medication Sig Start Date End Date Taking? Authorizing Provider  fluconazole (DIFLUCAN) 150 MG tablet Take 1 tablet (150 mg total) by mouth daily. Take second dose 72 hours later if symptoms still persists. 06/15/18   Tasia Catchings,  Amy V, PA-C  HYDROcodone-acetaminophen (NORCO/VICODIN) 5-325 MG tablet Take 1 tablet by mouth every 6 (six) hours as needed for severe pain. 06/15/18   Tasia Catchings, Amy V, PA-C  meloxicam (MOBIC) 7.5 MG tablet Take 1 tablet (7.5 mg total) by mouth daily. 06/15/18   Tasia Catchings, Amy V, PA-C  metroNIDAZOLE (METROGEL VAGINAL) 0.75 % vaginal gel Place 1 Applicatorful vaginally at bedtime for 5 days. 06/15/18 06/20/18  Ok Edwards, PA-C  tamsulosin (FLOMAX) 0.4 MG CAPS capsule Take 1 capsule (0.4 mg total) by mouth daily. 06/15/18   Ok Edwards, PA-C    Family History Family History  Problem Relation Age of Onset  . COPD Mother   . Depression Mother   . Hypertension Mother   . Hypertension Father   . Drug abuse Brother   . Cancer Maternal Aunt   . Hyperlipidemia Maternal Uncle   . Miscarriages / Stillbirths Maternal Uncle   . Diabetes Maternal Grandmother   . Miscarriages / Stillbirths Paternal Grandmother     Social History Social History   Tobacco Use  . Smoking status: Current Every Day Smoker    Packs/day: 0.50    Years: 0.00    Pack years: 0.00  Types: Cigarettes  . Smokeless tobacco: Never Used  Substance Use Topics  . Alcohol use: Yes  . Drug use: No     Allergies   Other   Review of Systems Review of Systems  Reason unable to perform ROS: See HPI as above.     Physical Exam Triage Vital Signs ED Triage Vitals [06/15/18 1700]  Enc Vitals Group     BP (!) 176/100     Pulse Rate 91     Resp 18     Temp 98.3 F (36.8 C)     Temp Source Oral     SpO2 97 %     Weight      Height      Head Circumference      Peak Flow      Pain Score 9     Pain Loc      Pain Edu?      Excl. in [place]?    No data found.  Updated Vital Signs BP (!) 176/100 (BP Location: Left Arm)   Pulse 91   Temp 98.3 F (36.8 C) (Oral)   Resp 18   SpO2 97%   Physical Exam  Constitutional: She is oriented to person, place, and time. She appears well-developed and well-nourished.  Non-toxic  appearance. She does not appear ill.  Patient looks uncomfortable without acute distress.   HENT:  Head: Normocephalic and atraumatic.  Eyes: Pupils are equal, round, and reactive to light. Conjunctivae are normal.  Cardiovascular: Normal rate, regular rhythm and normal heart sounds. Exam reveals no gallop and no friction rub.  No murmur heard. Pulmonary/Chest: Effort normal and breath sounds normal. No stridor. No respiratory distress. She has no wheezes. She has no rhonchi. She has no rales.  Abdominal: Soft. Bowel sounds are normal. She exhibits no mass. There is no rigidity, no rebound, no guarding and no CVA tenderness.  Tenderness to palpation of RLQ, LLQ, suprapubic region without obvious guarding or rebound.   Genitourinary: Uterus normal. There is no rash or tenderness on the right labia. There is no rash or tenderness on the left labia. Cervix exhibits no motion tenderness, no discharge and no friability. Right adnexum displays no mass and no tenderness. Left adnexum displays no mass and no tenderness. Vaginal discharge found.  Neurological: She is alert and oriented to person, place, and time.  Skin: Skin is warm and dry.  Psychiatric: She has a normal mood and affect. Her behavior is normal. Judgment normal.     UC Treatments / Results  Labs (all labs ordered are listed, but only abnormal results are displayed) Labs Reviewed  POCT URINALYSIS DIP (DEVICE) - Abnormal; Notable for the following components:      Result Value   Hgb urine dipstick SMALL (*)    All other components within normal limits  POCT PREGNANCY, URINE  CERVICOVAGINAL ANCILLARY ONLY    EKG None  Radiology No results found.  Procedures Procedures (including critical care time)  Medications Ordered in UC Medications  ketorolac (TORADOL) 30 MG/ML injection 30 mg (has no administration in time range)    Initial Impression / Assessment and Plan / UC Course  I have reviewed the triage vital signs and  the nursing notes.  Pertinent labs & imaging results that were available during my care of the patient were reviewed by me and considered in my medical decision making (see chart for details).    Urine with small blood.  Negative pregnancy, infection.  Patient with tenderness  to palpation of bilateral lower abdomen without guarding or rebound.  Pelvic exam without cervical motion tenderness, adnexal tenderness.  Patient without nausea, vomiting, fever.  Will treat for possible kidney stone.  Toradol injection in office today.  Mobic for pain, Norco for breakthrough pain.  Will cover for BV, MetroGel as directed.  Will provide Diflucan because of yeast infection.  Cytology sent.  Return precautions given.  Patient expresses understanding and agrees to plan.   Final Clinical Impressions(s) / UC Diagnoses   Final diagnoses:  Abdominal pain, bilateral lower quadrant  Vaginal discharge  Other microscopic hematuria    ED Prescriptions    Medication Sig Dispense Auth. Provider   meloxicam (MOBIC) 7.5 MG tablet Take 1 tablet (7.5 mg total) by mouth daily. 15 tablet Yu, Amy V, PA-C   HYDROcodone-acetaminophen (NORCO/VICODIN) 5-325 MG tablet Take 1 tablet by mouth every 6 (six) hours as needed for severe pain. 10 tablet Yu, Amy V, PA-C   metroNIDAZOLE (METROGEL VAGINAL) 0.75 % vaginal gel Place 1 Applicatorful vaginally at bedtime for 5 days. 50 g Yu, Amy V, PA-C   fluconazole (DIFLUCAN) 150 MG tablet Take 1 tablet (150 mg total) by mouth daily. Take second dose 72 hours later if symptoms still persists. 2 tablet Yu, Amy V, PA-C   tamsulosin (FLOMAX) 0.4 MG CAPS capsule Take 1 capsule (0.4 mg total) by mouth daily. 10 capsule Ok Edwards, PA-C     Controlled Substance Prescriptions [place] Controlled Substance Registry consulted? Yes, I have consulted the [place] Controlled Substances Registry for this patient, and feel the risk/benefit ratio today is favorable for proceeding with this prescription for a  controlled substance.   Ok Edwards, PA-C 06/15/18 1744

## 2018-06-15 NOTE — ED Triage Notes (Signed)
Pt here for lower abd pain worse on right side with some vaginal discharge

## 2018-06-16 LAB — CERVICOVAGINAL ANCILLARY ONLY
Chlamydia: NEGATIVE
NEISSERIA GONORRHEA: NEGATIVE
TRICH (WINDOWPATH): NEGATIVE

## 2018-06-29 ENCOUNTER — Encounter (HOSPITAL_COMMUNITY): Payer: Self-pay | Admitting: Emergency Medicine

## 2018-06-29 ENCOUNTER — Emergency Department (HOSPITAL_COMMUNITY)
Admission: EM | Admit: 2018-06-29 | Discharge: 2018-06-29 | Disposition: A | Discharge status: Home / Self Care | Payer: Medicaid Other | Attending: Emergency Medicine | Admitting: Emergency Medicine

## 2018-06-29 ENCOUNTER — Emergency Department (HOSPITAL_COMMUNITY): Payer: Medicaid Other

## 2018-06-29 DIAGNOSIS — I10 Essential (primary) hypertension: Secondary | ICD-10-CM | POA: Insufficient documentation

## 2018-06-29 DIAGNOSIS — Z79899 Other long term (current) drug therapy: Secondary | ICD-10-CM | POA: Insufficient documentation

## 2018-06-29 DIAGNOSIS — F17210 Nicotine dependence, cigarettes, uncomplicated: Secondary | ICD-10-CM | POA: Insufficient documentation

## 2018-06-29 DIAGNOSIS — J4521 Mild intermittent asthma with (acute) exacerbation: Secondary | ICD-10-CM | POA: Insufficient documentation

## 2018-06-29 LAB — CBC WITH DIFFERENTIAL/PLATELET
Abs Immature Granulocytes: 0.01 10*3/uL (ref 0.00–0.07)
BASOS ABS: 0 10*3/uL (ref 0.0–0.1)
Basophils Relative: 1 %
EOS PCT: 3 %
Eosinophils Absolute: 0.2 10*3/uL (ref 0.0–0.5)
HEMATOCRIT: 41 % (ref 36.0–46.0)
HEMOGLOBIN: 12.9 g/dL (ref 12.0–15.0)
IMMATURE GRANULOCYTES: 0 %
LYMPHS ABS: 2.1 10*3/uL (ref 0.7–4.0)
LYMPHS PCT: 38 %
MCH: 27.2 pg (ref 26.0–34.0)
MCHC: 31.5 g/dL (ref 30.0–36.0)
MCV: 86.5 fL (ref 80.0–100.0)
Monocytes Absolute: 0.4 10*3/uL (ref 0.1–1.0)
Monocytes Relative: 7 %
NEUTROS PCT: 51 %
Neutro Abs: 2.9 10*3/uL (ref 1.7–7.7)
Platelets: 265 10*3/uL (ref 150–400)
RBC: 4.74 MIL/uL (ref 3.87–5.11)
RDW: 14.3 % (ref 11.5–15.5)
WBC: 5.6 10*3/uL (ref 4.0–10.5)
nRBC: 0 % (ref 0.0–0.2)

## 2018-06-29 LAB — I-STAT BETA HCG BLOOD, ED (MC, WL, AP ONLY): I-stat hCG, quantitative: <5 m[IU]/mL (ref <5)

## 2018-06-29 LAB — COMPREHENSIVE METABOLIC PANEL
ALK PHOS: 67 U/L (ref 38–126)
ALT: 13 U/L (ref 0–44)
ANION GAP: 8 (ref 5–15)
AST: 19 U/L (ref 15–41)
Albumin: 3.4 g/dL — ABNORMAL LOW (ref 3.5–5.0)
BUN: 8 mg/dL (ref 6–20)
CO2: 25 mmol/L (ref 22–32)
Calcium: 8.8 mg/dL — ABNORMAL LOW (ref 8.9–10.3)
Chloride: 105 mmol/L (ref 98–111)
Creatinine, Ser: 0.99 mg/dL (ref 0.44–1.00)
GFR calc Af Amer: >60 mL/min (ref >60)
Glucose, Bld: 132 mg/dL — ABNORMAL HIGH (ref 70–99)
POTASSIUM: 3.2 mmol/L — AB (ref 3.5–5.1)
Sodium: 138 mmol/L (ref 135–145)
Total Bilirubin: 0.6 mg/dL (ref 0.3–1.2)
Total Protein: 6.7 g/dL (ref 6.5–8.1)

## 2018-06-29 MED ORDER — BENZONATATE 100 MG PO CAPS
100.0000 mg | ORAL_CAPSULE | Freq: Once | ORAL | Status: AC
  Administered 2018-06-29: 100 mg via ORAL
  Filled 2018-06-29: qty 1

## 2018-06-29 MED ORDER — PREDNISONE 20 MG PO TABS
60.0000 mg | ORAL_TABLET | Freq: Once | ORAL | Status: AC
  Administered 2018-06-29: 60 mg via ORAL
  Filled 2018-06-29: qty 3

## 2018-06-29 MED ORDER — IPRATROPIUM-ALBUTEROL 0.5-2.5 (3) MG/3ML IN SOLN
3.0000 mL | Freq: Once | RESPIRATORY_TRACT | Status: AC
  Administered 2018-06-29: 3 mL via RESPIRATORY_TRACT
  Filled 2018-06-29: qty 3

## 2018-06-29 MED ORDER — BENZONATATE 100 MG PO CAPS: 100.0000 mg | ORAL_CAPSULE | Freq: Three times a day (TID) | ORAL | 0 refills | Status: DC

## 2018-06-29 MED ORDER — ALBUTEROL SULFATE HFA 108 (90 BASE) MCG/ACT IN AERS
2.0000 | INHALATION_SPRAY | RESPIRATORY_TRACT | Status: DC | PRN
  Administered 2018-06-29: 2 via RESPIRATORY_TRACT
  Filled 2018-06-29: qty 6.7

## 2018-06-29 MED ORDER — POTASSIUM CHLORIDE CRYS ER 20 MEQ PO TBCR
40.0000 meq | EXTENDED_RELEASE_TABLET | Freq: Once | ORAL | Status: AC
  Administered 2018-06-29: 40 meq via ORAL
  Filled 2018-06-29: qty 2

## 2018-06-29 MED ORDER — KETOROLAC TROMETHAMINE 30 MG/ML IJ SOLN
30.0000 mg | Freq: Once | INTRAMUSCULAR | Status: AC
  Administered 2018-06-29: 30 mg via INTRAMUSCULAR
  Filled 2018-06-29: qty 1

## 2018-06-29 MED ORDER — PREDNISONE 20 MG PO TABS: 40.0000 mg | ORAL_TABLET | Freq: Every day | ORAL | 0 refills | Status: DC

## 2018-06-29 NOTE — Discharge Instructions (Addendum)
It was my pleasure taking care of you today!   Take prednisone daily starting tomorrow morning.  I have given you a refill of your home inhaler.   Follow up with your primary care provider for further discussion of today's ER visit.   If you develop any new or worsening symptoms, including but not limited to fever, persistent vomiting, worsening shortness of breath or other symptoms that concern you, please return to the Emergency Department immediately.
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
## 2018-06-29 NOTE — ED Triage Notes (Signed)
Pt to ER for evaluation of 2 days nasal congestion, cough, and body aches. Reports green/yellow sputum production.

## 2018-10-29 ENCOUNTER — Encounter: Payer: Self-pay | Admitting: Family Medicine

## 2018-10-29 ENCOUNTER — Ambulatory Visit (INDEPENDENT_AMBULATORY_CARE_PROVIDER_SITE_OTHER): Payer: Medicaid Other | Admitting: Family Medicine

## 2018-10-29 VITALS — BP 176/100 | HR 78 | Ht <= 58 in | Wt 189.0 lb

## 2018-10-29 DIAGNOSIS — I10 Essential (primary) hypertension: Secondary | ICD-10-CM | POA: Insufficient documentation

## 2018-10-29 DIAGNOSIS — B3731 Acute candidiasis of vulva and vagina: Secondary | ICD-10-CM

## 2018-10-29 DIAGNOSIS — F17210 Nicotine dependence, cigarettes, uncomplicated: Secondary | ICD-10-CM

## 2018-10-29 DIAGNOSIS — B373 Candidiasis of vulva and vagina: Secondary | ICD-10-CM

## 2018-10-29 DIAGNOSIS — N809 Endometriosis, unspecified: Secondary | ICD-10-CM

## 2018-10-29 DIAGNOSIS — Z720 Tobacco use: Secondary | ICD-10-CM

## 2018-10-29 MED ORDER — FLUCONAZOLE 150 MG PO TABS: 150.0000 mg | ORAL_TABLET | Freq: Every day | ORAL | 0 refills | Status: DC

## 2018-10-29 MED ORDER — AMLODIPINE BESYLATE 10 MG PO TABS: 10.0000 mg | ORAL_TABLET | Freq: Every day | ORAL | 3 refills | Status: DC

## 2018-10-29 NOTE — Patient Instructions (Signed)
[email or web address]  [phone] is the the phone number  Start taking Amlodipine 10mg  daily  Return for pap

## 2018-10-29 NOTE — Progress Notes (Signed)
Has a yeast infection and complaints of cramping for 3 weeks. Pt states she has endometriosis and is not sure if that is why she is cramping

## 2018-10-29 NOTE — Progress Notes (Signed)
   GYNECOLOGY PROBLEM  VISIT ENCOUNTER NOTE  Subjective:   Samantha Yoder is a 33 y.o. G39P0101 female here for a a problem GYN visit.  Current complaints: vaginal discharge- reports yeast infection about 2 weeks ago, took diflucan which helped symptoms but did not fully, She usually needs 2 tablets for infection. Denies odor. She reports this is not like her typical BV discharge.  Denies abnormal vaginal bleeding, discharge, problems with intercourse or other gynecologic concerns. Endorses chronic pelvic pain from endometriosis. Denies HA, blurry vision, chest pain.    Gynecologic History Patient's last menstrual period was 10/20/2018 (exact date). Contraception: none  Health Maintenance Due  Topic Date Due  . TETANUS/TDAP  10/26/2005  . PAP SMEAR-Modifier  10/27/2007  . INFLUENZA VACCINE  04/03/2018     The following portions of the patient's history were reviewed and updated as appropriate: allergies, current medications, past family history, past medical history, past social history, past surgical history and problem list.  Review of Systems Pertinent items are noted in HPI.   Objective:  BP (!) 176/100   Pulse 78   Ht 4\' 10"  (1.473 m)   Wt 189 lb (85.7 kg)   LMP 10/20/2018 (Exact Date)   BMI 39.50 kg/m  Gen: well appearing, NAD HEENT: no scleral icterus CV: RR Lung: Normal WOB Ext: warm well perfused  PELVIC: declined due to pelvic pain.  Assessment and Plan:  1. Yeast vaginitis - discussed coverage for wet preps but [place] medicaid. Reviewed that if sx continue I recommend going to [facility] or [facility] for wet prep.  - fluconazole (DIFLUCAN) 150 MG tablet; Take 1 tablet (150 mg total) by mouth daily. Take second dose 72 hours later if symptoms still persists.  Dispense: 2 tablet; Refill: 0  2. Essential hypertension Poorly controlled and severe range today - recommended DASH diet - Reviewed risks of HTN and patient agreed to start medication today.  - amLODipine (NORVASC)  10 MG tablet; Take 1 tablet (10 mg total) by mouth daily.  Dispense: 30 tablet; Refill: 3  3. Endometriosis determined by laparoscopy - will discuss at next visit, possible [place] candidate.   4. Tobacco use Continues to smoke 1ppd. Used motivational interviewing to discuss barriers to tobacco cessation. Spent 3-10 minutes in direct counseling about ways to reduce use with the goal of eventual cessation. Reviewed health concerns for pregnancy and infant related to tobacco use.  Discussed gradual reduction and replacement of cigs with other activities.  Patient stated goal:"Quit like her boyfriend," referral to [place] quitline placed today  Please refer to After Visit Summary for other counseling recommendations.   Return in about 4 weeks (around 11/26/2018) for Yearly wellness exam.  Caren Macadam, MD, MPH, ABFM Attending [place] for [facility]

## 2018-11-26 ENCOUNTER — Ambulatory Visit: Payer: Medicaid Other | Admitting: Family Medicine

## 2019-03-12 ENCOUNTER — Other Ambulatory Visit: Payer: Self-pay

## 2019-03-12 DIAGNOSIS — B373 Candidiasis of vulva and vagina: Secondary | ICD-10-CM

## 2019-03-12 DIAGNOSIS — B3731 Acute candidiasis of vulva and vagina: Secondary | ICD-10-CM

## 2019-03-12 MED ORDER — FLUCONAZOLE 150 MG PO TABS: 150.0000 mg | ORAL_TABLET | Freq: Every day | ORAL | 0 refills | Status: DC

## 2019-03-12 NOTE — Telephone Encounter (Signed)
Patient is requesting a diflucan rx.

## 2019-03-24 ENCOUNTER — Ambulatory Visit: Payer: Medicaid Other | Admitting: Obstetrics and Gynecology

## 2019-03-31 ENCOUNTER — Ambulatory Visit (INDEPENDENT_AMBULATORY_CARE_PROVIDER_SITE_OTHER): Payer: Medicaid Other | Admitting: Family Medicine

## 2019-03-31 ENCOUNTER — Encounter: Payer: Self-pay | Admitting: Family Medicine

## 2019-03-31 ENCOUNTER — Other Ambulatory Visit: Payer: Self-pay

## 2019-03-31 VITALS — BP 139/92 | HR 72 | Wt 187.2 lb

## 2019-03-31 DIAGNOSIS — N898 Other specified noninflammatory disorders of vagina: Secondary | ICD-10-CM

## 2019-03-31 DIAGNOSIS — Z0000 Encounter for general adult medical examination without abnormal findings: Secondary | ICD-10-CM

## 2019-03-31 DIAGNOSIS — N809 Endometriosis, unspecified: Secondary | ICD-10-CM

## 2019-03-31 DIAGNOSIS — Z01419 Encounter for gynecological examination (general) (routine) without abnormal findings: Secondary | ICD-10-CM

## 2019-03-31 DIAGNOSIS — Z720 Tobacco use: Secondary | ICD-10-CM

## 2019-03-31 DIAGNOSIS — I10 Essential (primary) hypertension: Secondary | ICD-10-CM
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
## 2019-03-31 NOTE — Progress Notes (Signed)
Family Medicaid  Patient reports having [facility] year 2019 and was normal.  History of high blood pressure not currently taking medication.

## 2019-04-01 LAB — HIV ANTIBODY (ROUTINE TESTING W REFLEX): HIV Screen 4th Generation wRfx: NONREACTIVE

## 2019-04-01 LAB — RPR: RPR Ser Ql: NONREACTIVE

## 2019-04-04 LAB — NUSWAB BV AND CANDIDA, NAA
Candida albicans, NAA: NEGATIVE
Candida glabrata, NAA: NEGATIVE

## 2019-04-26 ENCOUNTER — Encounter (HOSPITAL_COMMUNITY): Payer: Self-pay | Admitting: *Deleted

## 2019-04-26 ENCOUNTER — Emergency Department (HOSPITAL_COMMUNITY)
Admission: EM | Admit: 2019-04-26 | Discharge: 2019-04-27 | Disposition: A | Discharge status: Home / Self Care | Payer: Medicaid Other | Attending: Emergency Medicine | Admitting: Emergency Medicine

## 2019-04-26 ENCOUNTER — Other Ambulatory Visit: Payer: Self-pay

## 2019-04-26 DIAGNOSIS — Z5321 Procedure and treatment not carried out due to patient leaving prior to being seen by health care provider: Secondary | ICD-10-CM | POA: Insufficient documentation

## 2019-04-26 DIAGNOSIS — R109 Unspecified abdominal pain: Secondary | ICD-10-CM | POA: Insufficient documentation

## 2019-04-26 LAB — I-STAT BETA HCG BLOOD, ED (MC, WL, AP ONLY): I-stat hCG, quantitative: <5 m[IU]/mL (ref <5)

## 2019-04-26 LAB — COMPREHENSIVE METABOLIC PANEL
ALT: 87 U/L — ABNORMAL HIGH (ref 0–44)
AST: 45 U/L — ABNORMAL HIGH (ref 15–41)
Albumin: 3.8 g/dL (ref 3.5–5.0)
Alkaline Phosphatase: 75 U/L (ref 38–126)
Anion gap: 10 (ref 5–15)
BUN: 9 mg/dL (ref 6–20)
CO2: 26 mmol/L (ref 22–32)
Calcium: 9.1 mg/dL (ref 8.9–10.3)
Chloride: 102 mmol/L (ref 98–111)
Creatinine, Ser: 1.04 mg/dL — ABNORMAL HIGH (ref 0.44–1.00)
GFR calc Af Amer: >60 mL/min (ref >60)
GFR calc non Af Amer: >60 mL/min (ref >60)
Glucose, Bld: 104 mg/dL — ABNORMAL HIGH (ref 70–99)
Potassium: 3.5 mmol/L (ref 3.5–5.1)
Sodium: 138 mmol/L (ref 135–145)
Total Bilirubin: 0.3 mg/dL (ref 0.3–1.2)
Total Protein: 7 g/dL (ref 6.5–8.1)

## 2019-04-26 LAB — CBC
HCT: 41.9 % (ref 36.0–46.0)
Hemoglobin: 13.9 g/dL (ref 12.0–15.0)
MCH: 28.8 pg (ref 26.0–34.0)
MCHC: 33.2 g/dL (ref 30.0–36.0)
MCV: 86.9 fL (ref 80.0–100.0)
Platelets: 339 10*3/uL (ref 150–400)
RBC: 4.82 MIL/uL (ref 3.87–5.11)
RDW: 14.4 % (ref 11.5–15.5)
WBC: 8.7 10*3/uL (ref 4.0–10.5)
nRBC: 0 % (ref 0.0–0.2)

## 2019-04-26 LAB — LIPASE, BLOOD: Lipase: 31 U/L (ref 11–51)

## 2019-04-26 MED ORDER — SODIUM CHLORIDE 0.9% FLUSH: 3.0000 mL | Freq: Once | INTRAVENOUS | Status: DC

## 2019-04-26 NOTE — ED Notes (Signed)
Pt called in waiting room for triage with no answer

## 2019-04-26 NOTE — ED Notes (Signed)
Called for Pt to recheck vitals. No answer 

## 2019-04-26 NOTE — ED Triage Notes (Signed)
Pt reports "kidney pain" for about 3 days. Says that after she urinates the pain does get somewhat better. Right flank pain, radiates to the right lower abdomen and down the right leg. Clear vaginal discharge, recently checked for BV, pt says it was negative.

## 2019-04-27 NOTE — ED Notes (Signed)
Called for Pt to recheck vitals. No answer 

## 2019-05-29 ENCOUNTER — Other Ambulatory Visit: Payer: Self-pay

## 2019-05-29 ENCOUNTER — Emergency Department (HOSPITAL_COMMUNITY): Payer: Self-pay

## 2019-05-29 ENCOUNTER — Emergency Department (HOSPITAL_COMMUNITY)
Admission: EM | Admit: 2019-05-29 | Discharge: 2019-05-29 | Disposition: A | Discharge status: Home / Self Care | Payer: Self-pay | Attending: Emergency Medicine | Admitting: Emergency Medicine

## 2019-05-29 DIAGNOSIS — R0789 Other chest pain: Secondary | ICD-10-CM | POA: Insufficient documentation

## 2019-05-29 DIAGNOSIS — M549 Dorsalgia, unspecified: Secondary | ICD-10-CM

## 2019-05-29 DIAGNOSIS — J45909 Unspecified asthma, uncomplicated: Secondary | ICD-10-CM | POA: Insufficient documentation

## 2019-05-29 DIAGNOSIS — F17210 Nicotine dependence, cigarettes, uncomplicated: Secondary | ICD-10-CM | POA: Insufficient documentation

## 2019-05-29 DIAGNOSIS — M546 Pain in thoracic spine: Secondary | ICD-10-CM | POA: Insufficient documentation

## 2019-05-29 DIAGNOSIS — I10 Essential (primary) hypertension: Secondary | ICD-10-CM | POA: Insufficient documentation

## 2019-05-29 LAB — BASIC METABOLIC PANEL
Anion gap: 7 (ref 5–15)
BUN: 10 mg/dL (ref 6–20)
CO2: 26 mmol/L (ref 22–32)
Calcium: 9.1 mg/dL (ref 8.9–10.3)
Chloride: 105 mmol/L (ref 98–111)
Creatinine, Ser: 0.96 mg/dL (ref 0.44–1.00)
GFR calc Af Amer: >60 mL/min (ref >60)
GFR calc non Af Amer: >60 mL/min (ref >60)
Glucose, Bld: 94 mg/dL (ref 70–99)
Potassium: 3.6 mmol/L (ref 3.5–5.1)
Sodium: 138 mmol/L (ref 135–145)

## 2019-05-29 LAB — CBC
HCT: 42.9 % (ref 36.0–46.0)
Hemoglobin: 14 g/dL (ref 12.0–15.0)
MCH: 28.5 pg (ref 26.0–34.0)
MCHC: 32.6 g/dL (ref 30.0–36.0)
MCV: 87.4 fL (ref 80.0–100.0)
Platelets: 315 10*3/uL (ref 150–400)
RBC: 4.91 MIL/uL (ref 3.87–5.11)
RDW: 14.7 % (ref 11.5–15.5)
WBC: 6.2 10*3/uL (ref 4.0–10.5)
nRBC: 0 % (ref 0.0–0.2)

## 2019-05-29 LAB — I-STAT BETA HCG BLOOD, ED (MC, WL, AP ONLY): I-stat hCG, quantitative: <5 m[IU]/mL (ref <5)

## 2019-05-29 LAB — D-DIMER, QUANTITATIVE: D-Dimer, Quant: 0.68 ug/mL-FEU — ABNORMAL HIGH (ref 0.00–0.50)

## 2019-05-29 LAB — TROPONIN I (HIGH SENSITIVITY): Troponin I (High Sensitivity): 6 ng/L (ref <18)

## 2019-05-29 MED ORDER — ONDANSETRON HCL 4 MG/2ML IJ SOLN: 4.0000 mg | Freq: Once | INTRAMUSCULAR | Status: DC

## 2019-05-29 MED ORDER — LIDOCAINE 5 % EX PTCH: 1.0000 | MEDICATED_PATCH | CUTANEOUS | 0 refills | Status: DC

## 2019-05-29 MED ORDER — HYDROCODONE-ACETAMINOPHEN 5-325 MG PO TABS
1.0000 | ORAL_TABLET | Freq: Once | ORAL | Status: DC
  Filled 2019-05-29: qty 1

## 2019-05-29 MED ORDER — IOHEXOL 350 MG/ML SOLN
100.0000 mL | Freq: Once | INTRAVENOUS | Status: AC | PRN
  Administered 2019-05-29: 100 mL via INTRAVENOUS

## 2019-05-29 MED ORDER — SODIUM CHLORIDE 0.9% FLUSH
3.0000 mL | Freq: Once | INTRAVENOUS | Status: AC
  Administered 2019-05-29: 3 mL via INTRAVENOUS

## 2019-05-29 MED ORDER — NAPROXEN 500 MG PO TABS: 500.0000 mg | ORAL_TABLET | Freq: Two times a day (BID) | ORAL | 0 refills | Status: DC | PRN

## 2019-05-29 MED ORDER — KETOROLAC TROMETHAMINE 30 MG/ML IJ SOLN
15.0000 mg | Freq: Once | INTRAMUSCULAR | Status: AC
  Administered 2019-05-29: 14:00:00 15 mg via INTRAVENOUS
  Filled 2019-05-29: qty 1

## 2019-05-29 MED ORDER — ONDANSETRON 4 MG PO TBDP: 4.0000 mg | ORAL_TABLET | Freq: Once | ORAL | Status: DC
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
## 2019-05-29 NOTE — ED Notes (Signed)
Patient Alert and oriented to baseline. Stable and ambulatory to baseline. Patient verbalized understanding of the discharge instructions.  Patient belongings were taken by the patient.   

## 2019-05-29 NOTE — Discharge Instructions (Signed)
It was my pleasure taking care of you today!   Naproxen as needed for pain.  Lidoderm patches to apply to the area of discomfort will help as well.  In addition to these medications, you can take 1000 mg of Tylenol every 6-8 hours as well.  Follow-up with your primary care doctor if symptoms are not improving.  Return to the emergency department for new or worsening symptoms, any additional concerns.

## 2019-05-29 NOTE — ED Notes (Signed)
Patient transported to X-ray 

## 2019-05-29 NOTE — ED Notes (Signed)
Pt returned to room from Xray. 

## 2019-08-26 ENCOUNTER — Encounter (HOSPITAL_COMMUNITY): Payer: Self-pay | Admitting: Obstetrics and Gynecology

## 2019-08-26 ENCOUNTER — Other Ambulatory Visit: Payer: Self-pay

## 2019-08-26 ENCOUNTER — Inpatient Hospital Stay (HOSPITAL_COMMUNITY)
Admission: AD | Admit: 2019-08-26 | Discharge: 2019-08-27 | Disposition: A | Discharge status: Home / Self Care | Payer: Medicaid Other | Attending: Obstetrics and Gynecology | Admitting: Obstetrics and Gynecology

## 2019-08-26 DIAGNOSIS — R109 Unspecified abdominal pain: Secondary | ICD-10-CM | POA: Insufficient documentation

## 2019-08-26 DIAGNOSIS — Z3202 Encounter for pregnancy test, result negative: Secondary | ICD-10-CM | POA: Insufficient documentation

## 2019-08-26 DIAGNOSIS — M545 Low back pain: Secondary | ICD-10-CM | POA: Insufficient documentation

## 2019-08-26 LAB — CBC
HCT: 41 % (ref 36.0–46.0)
Hemoglobin: 13.1 g/dL (ref 12.0–15.0)
MCH: 27.8 pg (ref 26.0–34.0)
MCHC: 32 g/dL (ref 30.0–36.0)
MCV: 87 fL (ref 80.0–100.0)
Platelets: 332 10*3/uL (ref 150–400)
RBC: 4.71 MIL/uL (ref 3.87–5.11)
RDW: 14.4 % (ref 11.5–15.5)
WBC: 8.4 10*3/uL (ref 4.0–10.5)
nRBC: 0 % (ref 0.0–0.2)

## 2019-08-26 LAB — URINALYSIS, ROUTINE W REFLEX MICROSCOPIC
Bacteria, UA: NONE SEEN
Bilirubin Urine: NEGATIVE
Glucose, UA: NEGATIVE mg/dL
Ketones, ur: NEGATIVE mg/dL
Leukocytes,Ua: NEGATIVE
Nitrite: NEGATIVE
Protein, ur: NEGATIVE mg/dL
Specific Gravity, Urine: 1.016 (ref 1.005–1.030)
pH: 6 (ref 5.0–8.0)

## 2019-08-26 LAB — I-STAT BETA HCG BLOOD, ED (MC, WL, AP ONLY): I-stat hCG, quantitative: <5 m[IU]/mL (ref <5)

## 2019-08-26 LAB — POCT PREGNANCY, URINE: Preg Test, Ur: NEGATIVE

## 2019-08-26 MED ORDER — SODIUM CHLORIDE 0.9% FLUSH: 3.0000 mL | Freq: Once | INTRAVENOUS | Status: DC

## 2019-08-26 NOTE — ED Triage Notes (Signed)
Pt comes from UC from abd pain and back pain for the past week, worse over the past two days, worse in the RLQ

## 2019-08-26 NOTE — MAU Note (Signed)
Patient reports to MAU stating she was urgent care and they instructed her to come to MAU for a ectopic evaluation. Pt reports they told her she had blood in her urine but did not tell her she was pregnant. Pt reports they told her they can not rule out a UTI until she had a ectopic evaluation. Pt reports with back and abdominal pain that started 5-6 days ago and the pain is unbearable. Pt reports it is worse than labor. RLQ pain and lower back pain that is shooting 10/10. Pt reports her "boobs are on fire." LMP was  December 7th 2020 pt reports it was a weird period pt reports it was dark blood and 3days early. Pt reports having HBP but is not on medications.   Wt:193.7lbs Ht: 4'10"  Temp: 98.7 BP: 166/112 Pulse: 96 SpO2: 97

## 2019-08-26 NOTE — MAU Provider Note (Signed)
[name] is a 33 y.o. [id number] non-pregnant female who presents to MAU today with complaint of six day history of lower back and abdominal pain. She endorses LMP of 08/10/2019 but has not taken a home pregnancy test. She states she presented to Urgent Care and was told to present to MAU for ectopic workup.   Patient endorses chronic hypertension but states she has never been on medication.  O BP (!) 166/112 (BP Location: Left Arm)   Pulse 96   Temp 98.7 F (37.1 C) (Oral)   Resp 19    Physical Exam  Nursing note and vitals reviewed. Constitutional: She is oriented to person, place, and time. She appears well-developed and well-nourished.  Cardiovascular: Normal rate.  Respiratory: Effort normal.  Neurological: She is alert and oriented to person, place, and time.  Skin: Skin is warm and dry.  Psychiatric: She has a normal mood and affect. Her behavior is normal. Judgment and thought content normal.   A Negative pregnancy test in MAU Medical screening exam complete Patient stable, states she feels comfortable ambulating independently to [facility] Discharge from MAU in stable condition List of options for follow-up given  Patient may return to MAU as needed for pregnancy related complaints  S. Jeronimo Greaves, [place] 08/26/2019 10:29 PM

## 2019-08-26 NOTE — ED Notes (Signed)
EMT-Ken stated pt decided not to stay and left

## 2019-08-26 NOTE — MAU Note (Addendum)
MSE was preformed by [place] and patient reports she will return back to [facility] where she just came from due to NEG pregnancy test.  ED notified.

## 2019-08-27 LAB — COMPREHENSIVE METABOLIC PANEL
ALT: 19 U/L (ref 0–44)
AST: 19 U/L (ref 15–41)
Albumin: 3.6 g/dL (ref 3.5–5.0)
Alkaline Phosphatase: 69 U/L (ref 38–126)
Anion gap: 10 (ref 5–15)
BUN: 11 mg/dL (ref 6–20)
CO2: 27 mmol/L (ref 22–32)
Calcium: 9 mg/dL (ref 8.9–10.3)
Chloride: 103 mmol/L (ref 98–111)
Creatinine, Ser: 1.14 mg/dL — ABNORMAL HIGH (ref 0.44–1.00)
GFR calc Af Amer: >60 mL/min (ref >60)
GFR calc non Af Amer: >60 mL/min (ref >60)
Glucose, Bld: 94 mg/dL (ref 70–99)
Potassium: 3.2 mmol/L — ABNORMAL LOW (ref 3.5–5.1)
Sodium: 140 mmol/L (ref 135–145)
Total Bilirubin: 0.6 mg/dL (ref 0.3–1.2)
Total Protein: 6.9 g/dL (ref 6.5–8.1)

## 2019-08-27 LAB — LIPASE, BLOOD: Lipase: 34 U/L (ref 11–51)

## 2019-08-27 NOTE — ED Notes (Signed)
Called for vitals, no answer 

## 2019-08-27 NOTE — ED Notes (Signed)
Pt called for a 3rd time, no answer

## 2020-02-08 ENCOUNTER — Encounter: Payer: Self-pay | Admitting: Radiology

## 2020-03-27 ENCOUNTER — Inpatient Hospital Stay
Admit: 2020-03-27 | Discharge: 2020-03-27 | Disposition: A | Discharge status: Home / Self Care | Attending: Emergency Medicine

## 2020-03-27 ENCOUNTER — Emergency Department: Admit: 2020-03-27 | Primary: Family Medicine

## 2020-03-27 DIAGNOSIS — N83202 Unspecified ovarian cyst, left side: Secondary | ICD-10-CM

## 2020-03-27 DIAGNOSIS — N83209 Unspecified ovarian cyst, unspecified side: Secondary | ICD-10-CM | POA: Insufficient documentation

## 2020-03-27 LAB — LIPASE
Lipase: 94 U/L (ref 73–393)
Lipase: 94 U/L (ref 73–393)

## 2020-03-27 LAB — METABOLIC PANEL, BASIC
Anion gap: 7 mmol/L (ref 7–16)
BUN: 13 MG/DL (ref 6–23)
CO2: 28 mmol/L (ref 21–32)
Calcium: 8.8 MG/DL (ref 8.3–10.4)
Chloride: 104 mmol/L (ref 98–107)
Creatinine: 1.23 MG/DL — ABNORMAL HIGH (ref 0.6–1.0)
GFR est AA: >60 mL/min/{1.73_m2} (ref >60)
GFR est non-AA: 53 mL/min/{1.73_m2} — ABNORMAL LOW (ref >60)
Glucose: 112 mg/dL — ABNORMAL HIGH (ref 65–100)
Potassium: 3.1 mmol/L — ABNORMAL LOW (ref 3.5–5.1)
Sodium: 139 mmol/L (ref 136–145)

## 2020-03-27 LAB — CBC WITH AUTOMATED DIFF
ABS. BASOPHILS: 0 10*3/uL (ref 0.0–0.2)
ABS. EOSINOPHILS: 0.1 10*3/uL (ref 0.0–0.8)
ABS. IMM. GRANS.: 0 10*3/uL (ref 0.0–0.5)
ABS. LYMPHOCYTES: 3.1 10*3/uL (ref 0.5–4.6)
ABS. MONOCYTES: 0.6 10*3/uL (ref 0.1–1.3)
ABS. NEUTROPHILS: 6.9 10*3/uL (ref 1.7–8.2)
ABSOLUTE NRBC: 0 10*3/uL (ref 0.0–0.2)
BASOPHILS: 0 % (ref 0.0–2.0)
EOSINOPHILS: 1 % (ref 0.5–7.8)
HCT: 41.2 % (ref 35.8–46.3)
HGB: 13.6 g/dL (ref 11.7–15.4)
IMMATURE GRANULOCYTES: 0 % (ref 0.0–5.0)
LYMPHOCYTES: 29 % (ref 13–44)
MCH: 27.9 PG (ref 26.1–32.9)
MCHC: 33 g/dL (ref 31.4–35.0)
MCV: 84.6 FL (ref 79.6–97.8)
MONOCYTES: 5 % (ref 4.0–12.0)
MPV: 9.6 FL (ref 9.4–12.3)
NEUTROPHILS: 64 % (ref 43–78)
PLATELET: 343 10*3/uL (ref 150–450)
RBC: 4.87 M/uL (ref 4.05–5.2)
RDW: 14.6 % (ref 11.9–14.6)
WBC: 10.7 10*3/uL (ref 4.3–11.1)

## 2020-03-27 LAB — HCG URINE, QL. - POC
HCG, Pregnancy, Urine, POC: NEGATIVE
Pregnancy test,urine (POC): NEGATIVE

## 2020-03-27 LAB — CBC WITH AUTO DIFFERENTIAL
Basophils %: 0 % (ref 0.0–2.0)
Basophils Absolute: 0 10*3/uL (ref 0.0–0.2)
Eosinophils %: 1 % (ref 0.5–7.8)
Eosinophils Absolute: 0.1 10*3/uL (ref 0.0–0.8)
Granulocyte Absolute Count: 0 10*3/uL (ref 0.0–0.5)
Hematocrit: 41.2 % (ref 35.8–46.3)
Hemoglobin: 13.6 g/dL (ref 11.7–15.4)
Immature Granulocytes: 0 % (ref 0.0–5.0)
Lymphocytes %: 29 % (ref 13–44)
Lymphocytes Absolute: 3.1 10*3/uL (ref 0.5–4.6)
MCH: 27.9 PG (ref 26.1–32.9)
MCHC: 33 g/dL (ref 31.4–35.0)
MCV: 84.6 FL (ref 79.6–97.8)
MPV: 9.6 FL (ref 9.4–12.3)
Monocytes %: 5 % (ref 4.0–12.0)
Monocytes Absolute: 0.6 10*3/uL (ref 0.1–1.3)
NRBC Absolute: 0 10*3/uL (ref 0.0–0.2)
Neutrophils %: 64 % (ref 43–78)
Neutrophils Absolute: 6.9 10*3/uL (ref 1.7–8.2)
Platelets: 343 10*3/uL (ref 150–450)
RBC: 4.87 M/uL (ref 4.05–5.2)
RDW: 14.6 % (ref 11.9–14.6)
WBC: 10.7 10*3/uL (ref 4.3–11.1)

## 2020-03-27 LAB — BASIC METABOLIC PANEL
Anion Gap: 7 mmol/L (ref 7–16)
BUN: 13 MG/DL (ref 6–23)
CO2: 28 mmol/L (ref 21–32)
Calcium: 8.8 MG/DL (ref 8.3–10.4)
Chloride: 104 mmol/L (ref 98–107)
Creatinine: 1.23 MG/DL — ABNORMAL HIGH (ref 0.6–1.0)
EGFR IF NonAfrican American: 53 mL/min/{1.73_m2} — ABNORMAL LOW (ref >60)
GFR African American: >60 mL/min/{1.73_m2} (ref >60)
Glucose: 112 mg/dL — ABNORMAL HIGH (ref 65–100)
Potassium: 3.1 mmol/L — ABNORMAL LOW (ref 3.5–5.1)
Sodium: 139 mmol/L (ref 136–145)

## 2020-03-27 MED ORDER — BUPIVACAINE (PF) 0.25 % (2.5 MG/ML) IJ SOLN
0.25 % (2.5 mg/mL) | INTRAMUSCULAR | Status: AC
Start: 2020-03-27 — End: ?

## 2020-03-27 MED ORDER — FENTANYL CITRATE (PF) 50 MCG/ML IJ SOLN
50 mcg/mL | INTRAMUSCULAR | Status: AC
Start: 2020-03-27 — End: ?

## 2020-03-27 MED ORDER — SODIUM CHLORIDE 0.9 % IJ SYRG
Freq: Three times a day (TID) | INTRAMUSCULAR | Status: DC
Start: 2020-03-27 — End: 2020-03-27

## 2020-03-27 MED ORDER — ONDANSETRON (PF) 4 MG/2 ML INJECTION
4 mg/2 mL | INTRAMUSCULAR | Status: AC
Start: 2020-03-27 — End: 2020-03-27
  Administered 2020-03-27: 07:00:00 via INTRAVENOUS

## 2020-03-27 MED ORDER — SODIUM CHLORIDE 0.9 % IJ SYRG
INTRAMUSCULAR | Status: DC | PRN
Start: 2020-03-27 — End: 2020-03-28
  Administered 2020-03-28: 06:00:00 via INTRAVENOUS

## 2020-03-27 MED ORDER — ONDANSETRON (PF) 4 MG/2 ML INJECTION
4 mg/2 mL | Freq: Four times a day (QID) | INTRAMUSCULAR | Status: DC | PRN
Start: 2020-03-27 — End: 2020-03-28

## 2020-03-27 MED ORDER — NIFEDIPINE ER 30 MG 24 HR TAB
30 mg | ORAL_TABLET | Freq: Every day | ORAL | 3 refills | Status: AC
Start: 2020-03-27 — End: 2020-07-25

## 2020-03-27 MED ORDER — DIPHENHYDRAMINE HCL 50 MG/ML IJ SOLN
50 mg/mL | INTRAMUSCULAR | Status: DC | PRN
Start: 2020-03-27 — End: 2020-03-27

## 2020-03-27 MED ORDER — SALINE PERIPHERAL FLUSH PRN
Freq: Once | INTRAMUSCULAR | Status: AC
Start: 2020-03-27 — End: 2020-03-27
  Administered 2020-03-27: 08:00:00

## 2020-03-27 MED ORDER — LACTATED RINGERS IV
INTRAVENOUS | Status: DC
Start: 2020-03-27 — End: 2020-03-27
  Administered 2020-03-27: 12:00:00 via INTRAVENOUS

## 2020-03-27 MED ORDER — IBUPROFEN 200 MG TAB
200 mg | ORAL | Status: DC | PRN
Start: 2020-03-27 — End: 2020-03-27

## 2020-03-27 MED ORDER — MORPHINE 4 MG/ML INTRAVENOUS SOLUTION
4 mg/mL | INTRAVENOUS | Status: AC
Start: 2020-03-27 — End: 2020-03-27
  Administered 2020-03-27: 07:00:00 via INTRAVENOUS

## 2020-03-27 MED ORDER — HYDROCODONE-ACETAMINOPHEN 5 MG-325 MG TAB
5-325 mg | ORAL | Status: DC | PRN
Start: 2020-03-27 — End: 2020-03-27

## 2020-03-27 MED ORDER — KETOROLAC TROMETHAMINE 30 MG/ML INJECTION
30 mg/mL (1 mL) | INTRAMUSCULAR | Status: AC
Start: 2020-03-27 — End: 2020-03-27
  Administered 2020-03-27: 18:00:00 via INTRAVENOUS

## 2020-03-27 MED ORDER — PROPOFOL 10 MG/ML IV EMUL
10 mg/mL | INTRAVENOUS | Status: DC | PRN
Start: 2020-03-27 — End: 2020-03-27
  Administered 2020-03-27: 10:00:00 via INTRAVENOUS

## 2020-03-27 MED ORDER — SODIUM CHLORIDE 0.9 % IJ SYRG
INTRAMUSCULAR | Status: DC | PRN
Start: 2020-03-27 — End: 2020-03-27

## 2020-03-27 MED ORDER — NEOSTIGMINE METHYLSULFATE 3 MG/3 ML (1 MG/ML) IV SYRINGE
3 mg/ mL (1 mg/mL) | INTRAVENOUS | Status: DC | PRN
Start: 2020-03-27 — End: 2020-03-27
  Administered 2020-03-27: 12:00:00 via INTRAVENOUS

## 2020-03-27 MED ORDER — PROMETHAZINE 12.5 MG TAB
12.5 mg | ORAL_TABLET | Freq: Four times a day (QID) | ORAL | 0 refills | Status: AC | PRN
Start: 2020-03-27 — End: 2020-04-03

## 2020-03-27 MED ORDER — ONDANSETRON (PF) 4 MG/2 ML INJECTION
4 mg/2 mL | INTRAMUSCULAR | Status: DC | PRN
Start: 2020-03-27 — End: 2020-03-27
  Administered 2020-03-27: 11:00:00 via INTRAVENOUS

## 2020-03-27 MED ORDER — PROMETHAZINE 25 MG TAB
25 mg | Freq: Four times a day (QID) | ORAL | Status: DC | PRN
Start: 2020-03-27 — End: 2020-03-27

## 2020-03-27 MED ORDER — FENTANYL CITRATE (PF) 50 MCG/ML IJ SOLN
50 mcg/mL | INTRAMUSCULAR | Status: DC | PRN
Start: 2020-03-27 — End: 2020-03-27
  Administered 2020-03-27 (×3): via INTRAVENOUS

## 2020-03-27 MED ORDER — KETOROLAC TROMETHAMINE 30 MG/ML INJECTION
30 mg/mL (1 mL) | INTRAMUSCULAR | Status: DC | PRN
Start: 2020-03-27 — End: 2020-03-27
  Administered 2020-03-27: 12:00:00 via INTRAVENOUS

## 2020-03-27 MED ORDER — CEFAZOLIN 2 GRAM/20 ML IN STERILE WATER INTRAVENOUS SYRINGE
2 gram/0 mL | INTRAVENOUS | Status: DC | PRN
Start: 2020-03-27 — End: 2020-03-27
  Administered 2020-03-27: 11:00:00 via INTRAVENOUS

## 2020-03-27 MED ORDER — ALBUTEROL SULFATE 0.083 % (0.83 MG/ML) SOLN FOR INHALATION
2.5 mg /3 mL (0.083 %) | RESPIRATORY_TRACT | Status: DC | PRN
Start: 2020-03-27 — End: 2020-03-27

## 2020-03-27 MED ORDER — SODIUM CHLORIDE 0.9% BOLUS IV
0.9 % | Freq: Once | INTRAVENOUS | Status: AC
Start: 2020-03-27 — End: 2020-03-27
  Administered 2020-03-27: 08:00:00 via INTRAVENOUS

## 2020-03-27 MED ORDER — SUCCINYLCHOLINE CHLORIDE 20 MG/ML INJECTION
20 mg/mL | INTRAMUSCULAR | Status: DC | PRN
Start: 2020-03-27 — End: 2020-03-27
  Administered 2020-03-27: 10:00:00 via INTRAVENOUS

## 2020-03-27 MED ORDER — HYDROMORPHONE 1 MG/ML INJECTION SOLUTION
1 mg/mL | INTRAMUSCULAR | Status: DC | PRN
Start: 2020-03-27 — End: 2020-03-27
  Administered 2020-03-27: 14:00:00 via INTRAVENOUS

## 2020-03-27 MED ORDER — IOPAMIDOL 76 % IV SOLN
76 % | Freq: Once | INTRAVENOUS | Status: AC
Start: 2020-03-27 — End: 2020-03-27
  Administered 2020-03-27: 08:00:00 via INTRAVENOUS

## 2020-03-27 MED ORDER — HYDROCODONE-ACETAMINOPHEN 5 MG-325 MG TAB
5-325 mg | ORAL_TABLET | Freq: Four times a day (QID) | ORAL | 0 refills | Status: AC | PRN
Start: 2020-03-27 — End: 2020-04-03

## 2020-03-27 MED ORDER — SODIUM CHLORIDE 0.9% BOLUS IV
0.9 % | Freq: Once | INTRAVENOUS | Status: AC
Start: 2020-03-27 — End: 2020-03-27
  Administered 2020-03-27: 07:00:00 via INTRAVENOUS

## 2020-03-27 MED ORDER — HYDROCODONE-ACETAMINOPHEN 10 MG-325 MG TAB
10-325 mg | ORAL | Status: DC | PRN
Start: 2020-03-27 — End: 2020-03-28
  Administered 2020-03-27 – 2020-03-28 (×5): via ORAL

## 2020-03-27 MED ORDER — OXYCODONE 5 MG TAB
5 mg | Freq: Once | ORAL | Status: DC | PRN
Start: 2020-03-27 — End: 2020-03-27

## 2020-03-27 MED ORDER — LACTATED RINGERS IV
INTRAVENOUS | Status: DC | PRN
Start: 2020-03-27 — End: 2020-03-27
  Administered 2020-03-27: 10:00:00 via INTRAVENOUS

## 2020-03-27 MED ORDER — GLYCOPYRROLATE 0.2 MG/ML IJ SOLN
0.2 mg/mL | INTRAMUSCULAR | Status: DC | PRN
Start: 2020-03-27 — End: 2020-03-27
  Administered 2020-03-27: 12:00:00 via INTRAVENOUS

## 2020-03-27 MED ORDER — MIDAZOLAM 1 MG/ML IJ SOLN
1 mg/mL | INTRAMUSCULAR | Status: AC
Start: 2020-03-27 — End: ?

## 2020-03-27 MED ORDER — DIATRIZOATE MEGLUMINE & SODIUM 66 %-10 % ORAL SOLN
66-10 % | Freq: Once | ORAL | Status: AC
Start: 2020-03-27 — End: 2020-03-27
  Administered 2020-03-27: 07:00:00 via ORAL

## 2020-03-27 MED ORDER — NALOXONE 0.4 MG/ML INJECTION
0.4 mg/mL | INTRAMUSCULAR | Status: DC | PRN
Start: 2020-03-27 — End: 2020-03-28

## 2020-03-27 MED ORDER — MIDAZOLAM 1 MG/ML IJ SOLN
1 mg/mL | INTRAMUSCULAR | Status: DC | PRN
Start: 2020-03-27 — End: 2020-03-27
  Administered 2020-03-27: 10:00:00 via INTRAVENOUS

## 2020-03-27 MED ORDER — LIDOCAINE (PF) 20 MG/ML (2 %) IJ SOLN
20 mg/mL (2 %) | INTRAMUSCULAR | Status: DC | PRN
Start: 2020-03-27 — End: 2020-03-27
  Administered 2020-03-27: 10:00:00 via INTRAVENOUS

## 2020-03-27 MED ORDER — PROMETHAZINE 25 MG/ML INJECTION
25 mg/mL | Freq: Once | INTRAMUSCULAR | Status: DC | PRN
Start: 2020-03-27 — End: 2020-03-27

## 2020-03-27 MED ORDER — DEXAMETHASONE SODIUM PHOSPHATE 4 MG/ML IJ SOLN
4 mg/mL | INTRAMUSCULAR | Status: DC | PRN
Start: 2020-03-27 — End: 2020-03-27
  Administered 2020-03-27: 11:00:00 via INTRAVENOUS

## 2020-03-27 MED ORDER — HYDROMORPHONE 2 MG/ML INJECTION SOLUTION
2 mg/mL | INTRAMUSCULAR | Status: DC | PRN
Start: 2020-03-27 — End: 2020-03-27

## 2020-03-27 MED ORDER — BENZOCAINE-MENTHOL 15 MG-2.6 MG LOZENGES
Status: DC | PRN
Start: 2020-03-27 — End: 2020-03-28

## 2020-03-27 MED ORDER — SODIUM CHLORIDE 0.9 % IJ SYRG
Freq: Three times a day (TID) | INTRAMUSCULAR | Status: DC
Start: 2020-03-27 — End: 2020-03-28
  Administered 2020-03-27 – 2020-03-28 (×4): via INTRAVENOUS

## 2020-03-27 MED ORDER — BUPIVACAINE (PF) 0.25 % (2.5 MG/ML) IJ SOLN
0.25 % (2.5 mg/mL) | INTRAMUSCULAR | Status: DC | PRN
Start: 2020-03-27 — End: 2020-03-27
  Administered 2020-03-27: 12:00:00 via SUBCUTANEOUS

## 2020-03-27 MED ORDER — MEDROXYPROGESTERONE 10 MG TAB
10 mg | Freq: Every day | ORAL | Status: DC
Start: 2020-03-27 — End: 2020-03-28
  Administered 2020-03-28: 13:00:00 via ORAL

## 2020-03-27 MED ORDER — HYDRALAZINE 20 MG/ML IJ SOLN
20 mg/mL | INTRAMUSCULAR | Status: DC | PRN
Start: 2020-03-27 — End: 2020-03-27
  Administered 2020-03-27: 12:00:00 via INTRAVENOUS

## 2020-03-27 MED ORDER — HYDRALAZINE 20 MG/ML IJ SOLN
20 mg/mL | Freq: Once | INTRAMUSCULAR | Status: AC
Start: 2020-03-27 — End: 2020-03-27
  Administered 2020-03-27: 13:00:00 via INTRAVENOUS

## 2020-03-27 MED ORDER — KETOROLAC TROMETHAMINE 30 MG/ML INJECTION
30 mg/mL (1 mL) | Freq: Four times a day (QID) | INTRAMUSCULAR | Status: DC
Start: 2020-03-27 — End: 2020-03-28
  Administered 2020-03-28 (×3): via INTRAVENOUS

## 2020-03-27 MED ORDER — NIFEDIPINE ER 30 MG 24 HR TAB
30 mg | Freq: Every day | ORAL | Status: DC
Start: 2020-03-27 — End: 2020-03-28
  Administered 2020-03-27 – 2020-03-28 (×2): via ORAL

## 2020-03-27 MED ORDER — ROCURONIUM 10 MG/ML IV
10 mg/mL | INTRAVENOUS | Status: DC | PRN
Start: 2020-03-27 — End: 2020-03-27
  Administered 2020-03-27 (×2): via INTRAVENOUS

## 2020-03-27 MED ORDER — HYDROMORPHONE 1 MG/ML INJECTION SOLUTION
1 mg/mL | INTRAMUSCULAR | Status: AC
Start: 2020-03-27 — End: 2020-03-27
  Administered 2020-03-27: 08:00:00 via INTRAVENOUS

## 2020-03-27 MED FILL — IBUPROFEN 200 MG TAB: 200 mg | ORAL | Qty: 1

## 2020-03-27 MED FILL — HYDROMORPHONE (PF) 1 MG/ML IJ SOLN: 1 mg/mL | INTRAMUSCULAR | Qty: 1

## 2020-03-27 MED FILL — FENTANYL CITRATE (PF) 50 MCG/ML IJ SOLN: 50 mcg/mL | INTRAMUSCULAR | Qty: 2

## 2020-03-27 MED FILL — KETOROLAC TROMETHAMINE 30 MG/ML INJECTION: 30 mg/mL (1 mL) | INTRAMUSCULAR | Qty: 1

## 2020-03-27 MED FILL — CEPACOL SORE THROAT (BENZOCAINE-MENTHOL) 15 MG-2.6 MG LOZENGES: Qty: 16

## 2020-03-27 MED FILL — MD-GASTROVIEW 66 %-10 % ORAL SOLUTION: 66-10 % | ORAL | Qty: 30

## 2020-03-27 MED FILL — HYDRALAZINE 20 MG/ML IJ SOLN: 20 mg/mL | INTRAMUSCULAR | Qty: 1

## 2020-03-27 MED FILL — HYDROCODONE-ACETAMINOPHEN 10 MG-325 MG TAB: 10-325 mg | ORAL | Qty: 1

## 2020-03-27 MED FILL — MARCAINE (PF) 0.25 % (2.5 MG/ML) INJECTION SOLUTION: 0.25 % (2.5 mg/mL) | INTRAMUSCULAR | Qty: 30

## 2020-03-27 MED FILL — MIDAZOLAM 1 MG/ML IJ SOLN: 1 mg/mL | INTRAMUSCULAR | Qty: 2

## 2020-03-27 MED FILL — NIFEDIPINE ER 30 MG 24 HR TAB: 30 mg | ORAL | Qty: 1

## 2020-03-27 MED FILL — MORPHINE 4 MG/ML INTRAVENOUS SOLUTION: 4 mg/mL | INTRAVENOUS | Qty: 1

## 2020-03-27 MED FILL — ONDANSETRON (PF) 4 MG/2 ML INJECTION: 4 mg/2 mL | INTRAMUSCULAR | Qty: 2

## 2020-03-27 MED FILL — MEDROXYPROGESTERONE 10 MG TAB: 10 mg | ORAL | Qty: 1

## 2020-03-27 NOTE — Progress Notes (Signed)
General Daily Progress Note    Admit Date: 03/27/2020  Hospital day 0    Subjective:     Pt seen around 11:30 am. At that time she had just received iv dilaudid for pain. Pain was improved, but pt felt like she was not ready to get up or to eat.   Pt c/o sore throat  Reports abdominal pain much improved  I again rounded around 17:00, pt sleeping soundly. Uneaten dinner tray at bedside. RN reported pt not ready to go.    Current Facility-Administered Medications   Medication Dose Route Frequency   ??? HYDROmorphone (DILAUDID) injection 1 mg  1 mg IntraVENous Q2H PRN   ??? sodium chloride (NS) flush 5-40 mL  5-40 mL IntraVENous Q8H   ??? sodium chloride (NS) flush 5-40 mL  5-40 mL IntraVENous PRN   ??? naloxone (NARCAN) injection 0.4 mg  0.4 mg IntraVENous PRN   ??? promethazine (PHENERGAN) tablet 25 mg  25 mg Oral Q6H PRN   ??? diphenhydrAMINE (BENADRYL) injection 12.5 mg  12.5 mg IntraVENous Q4H PRN   ??? ondansetron (ZOFRAN) injection 4 mg  4 mg IntraVENous Q6H PRN   ??? ibuprofen (MOTRIN) tablet 200 mg  200 mg Oral Q4H PRN   ??? NIFEdipine ER (PROCARDIA XL) tablet 30 mg  30 mg Oral DAILY   ??? benzocaine-menthol (CEPACOL) lozenge  1 Lozenge Oral Q2H PRN   ??? [START ON 03/28/2020] medroxyPROGESTERone (PROVERA) tablet 10 mg  10 mg Oral DAILY   ??? HYDROcodone-acetaminophen (NORCO) 10-325 mg tablet 1 Tablet  1 Tablet Oral Q4H PRN        Review of Systems  A comprehensive review of systems was negative except for that written in the HPI.    Objective:     Patient Vitals for the past 8 hrs:   BP Temp Pulse Resp SpO2   03/27/20 1555 133/84 98.9 ??F (37.2 ??C) 85 16 98 %   03/27/20 1050 (!) 146/88 98.6 ??F (37 ??C) 82 16 100 %     07/25 0701 - 07/25 1900  In: 1300 [I.V.:1300]  Out: 850 [Urine:800]  No intake/output data recorded.    Physical Exam:   Visit Vitals  BP 133/84 (BP 1 Location: Left upper arm, BP Patient Position: At rest;Supine)   Pulse 85   Temp 98.9 ??F (37.2 ??C)   Resp 16   Ht 4\' 10"  (1.473 m)   Wt 85 kg (187 lb 6.3 oz)   SpO2 98%    Breastfeeding No   BMI 39.16 kg/m??     General appearance: alert, cooperative, no distress  Abdomen: soft, non-tender. Bowel sounds normal. No masses,  no organomegaly          Data Review   Recent Results (from the past 24 hour(s))   CBC WITH AUTOMATED DIFF    Collection Time: 03/27/20  2:46 AM   Result Value Ref Range    WBC 10.7 4.3 - 11.1 K/uL    RBC 4.87 4.05 - 5.2 M/uL    HGB 13.6 11.7 - 15.4 g/dL    HCT 03/29/20 85.6 - 31.4 %    MCV 84.6 79.6 - 97.8 FL    MCH 27.9 26.1 - 32.9 PG    MCHC 33.0 31.4 - 35.0 g/dL    RDW 97.0 26.3 - 78.5 %    PLATELET 343 150 - 450 K/uL    MPV 9.6 9.4 - 12.3 FL    ABSOLUTE NRBC 0.00 0.0 - 0.2 K/uL  DF AUTOMATED      NEUTROPHILS 64 43 - 78 %    LYMPHOCYTES 29 13 - 44 %    MONOCYTES 5 4.0 - 12.0 %    EOSINOPHILS 1 0.5 - 7.8 %    BASOPHILS 0 0.0 - 2.0 %    IMMATURE GRANULOCYTES 0 0.0 - 5.0 %    ABS. NEUTROPHILS 6.9 1.7 - 8.2 K/UL    ABS. LYMPHOCYTES 3.1 0.5 - 4.6 K/UL    ABS. MONOCYTES 0.6 0.1 - 1.3 K/UL    ABS. EOSINOPHILS 0.1 0.0 - 0.8 K/UL    ABS. BASOPHILS 0.0 0.0 - 0.2 K/UL    ABS. IMM. GRANS. 0.0 0.0 - 0.5 K/UL   METABOLIC PANEL, BASIC    Collection Time: 03/27/20  2:46 AM   Result Value Ref Range    Sodium 139 136 - 145 mmol/L    Potassium 3.1 (L) 3.5 - 5.1 mmol/L    Chloride 104 98 - 107 mmol/L    CO2 28 21 - 32 mmol/L    Anion gap 7 7 - 16 mmol/L    Glucose 112 (H) 65 - 100 mg/dL    BUN 13 6 - 23 MG/DL    Creatinine 1.44 (H) 0.6 - 1.0 MG/DL    GFR est AA >31 >54 MG/QQP/6.19J0    GFR est non-AA 53 (L) >60 ml/min/1.70m2    Calcium 8.8 8.3 - 10.4 MG/DL   LIPASE    Collection Time: 03/27/20  2:46 AM   Result Value Ref Range    Lipase 94 73 - 393 U/L   HCG URINE, QL. - POC    Collection Time: 03/27/20  2:53 AM   Result Value Ref Range    Pregnancy test,urine (POC) Negative NEG             Assessment:     Active Problems:    Ovarian torsion (03/27/2020)    HTN- improved with procardia  Noted to be hemorrhagic cyst, no ovarian torsion  Day #0 diagnostic laparoscope  Plan:     Pain  much improved  Work towards discharge - not ready yet

## 2020-03-27 NOTE — Progress Notes (Signed)
Admission assessment completed. Pt is in a lot of pain. Medication given. 3 P sites c/d/i. Has voided. Verbalizes needs well. Sipping ginger ale. Friend at bedside.

## 2020-03-27 NOTE — Progress Notes (Signed)
Chart reviewed. Account note from registration states that the patient has [place] Medicaid but did not have her cards on her person when she presented to the ER. SW went to meet with the patient to ensure that this information was accurate and inquire about primary care. Patient appeared drowsy and hoarse and asked SW to return stating that she was tired. SW agreed, advised that someone would follow up with her tomorrow.     Abe People, LMSW  Social Work Case Manager  [facility]    * [email or web address]

## 2020-03-27 NOTE — Progress Notes (Signed)
03/27/20 0953   Dual Skin Pressure Injury Assessment   Dual Skin Pressure Injury Assessment WDL   Second Care Provider (Based on Facility Policy)   Hillery Hunter, Rn)   Skin Integumentary   Skin Integumentary (WDL) X    Pressure  Injury Documentation No Pressure Injury Noted-Pressure Ulcer Prevention Initiated   Skin Integrity Incision (comment)  (puncture sites x 3 c/d/i)

## 2020-03-27 NOTE — Progress Notes (Signed)
PM assessment complete. Alert, oriented x 4. Respirations even and unlabored, no distress noted. Abdomen soft, tender, patient denies passing flatus. Three abdominal lap sites intact. Bowel sounds active. SCDS in place.

## 2020-03-27 NOTE — Anesthesia Post-Procedure Evaluation (Signed)
Procedure(s):  LAPAROSCOPY GENERAL DIAGNOSTIC.    general    Anesthesia Post Evaluation      Multimodal analgesia: multimodal analgesia used between 6 hours prior to anesthesia start to PACU discharge  Patient location during evaluation: bedside  Patient participation: complete - patient participated  Level of consciousness: awake  Pain management: adequate  Airway patency: patent  Anesthetic complications: no  Cardiovascular status: acceptable  Respiratory status: spontaneous ventilation and acceptable  Hydration status: acceptable  Comments: BP improved after Hydralazine.  Patient sent home with Rx for Porcardia per surgeon.  Post anesthesia nausea and vomiting:  none      INITIAL Post-op Vital signs:   Vitals Value Taken Time   BP 164/87 03/27/20 0902   Temp 37.3 ??C (99.1 ??F) 03/27/20 0815   Pulse 91 03/27/20 0902   Resp 16 03/27/20 0902   SpO2 97 % 03/27/20 0902

## 2020-03-27 NOTE — Progress Notes (Signed)
Dilaudid 1 mg IVP for pain in abd 10/10

## 2020-03-27 NOTE — Progress Notes (Signed)
Surgical consents signed after explanation of procedure done per Dr Leavy Cella

## 2020-03-27 NOTE — Op Note (Signed)
Laparoscopic op note     PATIENT: Wanda Clark  MRN: [id number]    Pre-operative Diagnosis: Ovarian torsion    Post-operative Diagnosis: extensive pelvic adhesive disease, bilateral ovarin cyst, right endometrioma vs hemorrhagic cyst, bilateral hydrosalpinginx    Surgeon:  Vickey Huger, MD     Anesthesia: General    Procedure: Procedure(s):  LAPAROSCOPY GENERAL DIAGNOSTIC, lysis of adhesions, drainage of right ovarian cyst    Estimated Blood Loss:    25cc      Findings:  Extensive abdomino-pelvic adhesions: omenteum adherent to anterior abdominal wall from left to right about the level of umbilicus, adhesions of utereus to anterior abdominal wall above bladder reflection, both ovaries with adhesive disease involving the sigmoid colon on left and the right pelvic sidewall on left. Both tubes are adherent to posterior aspect of broad ligament, appeared dilated,  fimbriae were not visible either side. Both ovaries mildly enlarged, right sided hemorrhagic ovarian cyst versus relatively new endometrioma/not a chocolate cyst.     Procedure in detail:    After thorough counseling and consent, the patient was taken to operative suite where she was placed in the supine position and underwent general endotracheal anesthesia without incident. She was placed in the low lithotomy position using the Allen stirrups.   She was prepped with Betadine solution on the abdomen and vagina and was draped in the usual sterile fashion. A Foley catheter was placed transurethrally to bag drainage. A speculum was placed in the vagina. The cervix was visualized and grasped with a single-tooth tenaculum and sounded to 6cm . Cervical dilation was performed with Hanks dilators to [employer]  Next, the surgeon's gloves were changed and attention was turned to the abdomen where a diagnostic laparoscopy was performed. Marcaine was injected in the umbilicus.  An umbilical 10 mm incision was made at the base of the umbilicus within the  previous scar. The defect in the fascia was palpated, the peritoneum dissected off and entered sharply with metzenbaums. The hassan 11 mm nonbladed trocar was placed after securing the fascial edges with 0-pds. Placement of camera confirme intraabdominal position and insufflation was performed. . Right lower quadrant and suprapubic 5 mm nonbladed ports were placed using transillumination and direct visualization. .  Abdomen and pelvis were surveyed and findings noted. The left ovary and uterus were freed from adhesions using mostly sharp dissection with ligasure. The right ovary was freed as much as possible using ligasure and blunt dissection. The right ovarian cyst was opened, drained and irrigated. The left ovary remained adherent to the posterior leaf of broad ligament. Irrigation was performed.  The  trocars were removed under direct visualization. The sutures already placed at the umbilicus were closed.  The skin was closed with subcutaneous 4-0  Vicryl followed by Dermabond on all sites.  The cohen uterine manipulator was removed. The patient was awakened from general endotracheal anesthesia and transferred to the Postanesthesia Care Unit in good condition. She tolerated the procedure well.     Her adhesive disease coupled with her bilateral hydrosalpinx would make her at high risk for infertility and ectopic pregnancy should she concieve    Signed By: Vickey Huger, MD

## 2020-03-27 NOTE — Anesthesia Pre-Procedure Evaluation (Signed)
Relevant Problems   No relevant active problems       Anesthetic History   No history of anesthetic complications            Review of Systems / Medical History  Patient summary reviewed, nursing notes reviewed and pertinent labs reviewed    Pulmonary          Smoker  Asthma : well controlled       Neuro/Psych   Within defined limits           Cardiovascular    Hypertension: poorly controlled              Exercise tolerance: >4 METS     GI/Hepatic/Renal     GERD (occassional): well controlled           Endo/Other        Morbid obesity     Other Findings            Physical Exam    Airway  Mallampati: II  TM Distance: > 6 cm  Neck ROM: normal range of motion   Mouth opening: Normal     Cardiovascular  Regular rate and rhythm,  S1 and S2 normal,  no murmur, click, rub, or gallop             Dental  No notable dental hx       Pulmonary  Breath sounds clear to auscultation               Abdominal         Other Findings            Anesthetic Plan    ASA: 3, emergent  Anesthesia type: general          Induction: Intravenous and RSI  Anesthetic plan and risks discussed with: Patient and Family

## 2020-03-27 NOTE — Progress Notes (Signed)
Problem: Surgical Pathway Day of Surgery  Goal: Activity/Safety  03/27/2020 2328 by Cottie Banda, RN  Outcome: Progressing Towards Goal  03/27/2020 2124 by Cottie Banda, RN  Outcome: Progressing Towards Goal     Problem: Falls - Risk of  Goal: *Absence of Falls  Description: Document Bridgette Habermann Fall Risk and appropriate interventions in the flowsheet.  03/27/2020 2328 by Cottie Banda, RN  Outcome: Progressing Towards Goal  Note: Fall Risk Interventions:            Medication Interventions: Teach patient to arise slowly    Elimination Interventions: Call light in reach           03/27/2020 2124 by Cottie Banda, RN  Outcome: Progressing Towards Goal  Note: Fall Risk Interventions:            Medication Interventions: Teach patient to arise slowly    Elimination Interventions: Call light in reach

## 2020-03-27 NOTE — Progress Notes (Signed)
Problem: Patient Education: Go to Patient Education Activity  Goal: Patient/Family Education  Outcome: Progressing Towards Goal     Problem: Surgical Pathway Day of Surgery  Goal: Off Pathway (Use only if patient is Off Pathway)  Outcome: Progressing Towards Goal  Goal: Activity/Safety  Outcome: Progressing Towards Goal     Problem: Falls - Risk of  Goal: *Absence of Falls  Description: Document Schmid Fall Risk and appropriate interventions in the flowsheet.  Outcome: Progressing Towards Goal  Note: Fall Risk Interventions:                                Problem: Patient Education: Go to Patient Education Activity  Goal: Patient/Family Education  Outcome: Progressing Towards Goal

## 2020-03-27 NOTE — ED Notes (Signed)
 Pt presents to the ED for c/o sudden onset of severe lower abd pain with a large amt of vaginal bleeding. Pt states that she has a hx of hemorrhagic cysts and endometriosis.  Was using an OTC fertility supplement called pink Stork . Stopped the medication several days ago after the boyfriend developed some canker sores in his mouth.  States that the pain is mos intense in the right lower abd quadrant.  Masked on arrival

## 2020-03-27 NOTE — Interval H&P Note (Signed)
 TRANSFER - OUT REPORT:    Verbal report given to Judy RN(name) on [employer]  being transferred to 347(unit) for routine post - op       Report consisted of patient's Situation, Background, Assessment and   Recommendations(SBAR).     Information from the following report(s) SBAR, Kardex, OR Summary, Intake/Output, MAR and Cardiac Rhythm NSR was reviewed with the receiving nurse.    Lines:   Peripheral IV 03/27/20 Anterior;Proximal;Right Forearm (Active)   Site Assessment Clean, dry, & intact 03/27/20 0845   Phlebitis Assessment 0 03/27/20 0845   Infiltration Assessment 0 03/27/20 0845   Dressing Status Clean, dry, & intact 03/27/20 0845   Dressing Type Transparent;Tape 03/27/20 0845   Hub Color/Line Status Pink;Infusing 03/27/20 0845        Opportunity for questions and clarification was provided.      Patient transported with:   Room air

## 2020-03-27 NOTE — Progress Notes (Signed)
Patient with minimal pain relief after receiving Norco and Toradol and ambulating. Dr. Jerilee Hoh notified, order received, see MAR.
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
## 2020-03-27 NOTE — Progress Notes (Signed)
Norco 10 mg given po for 8/10 abdomen. Safety measures in place.

## 2020-03-27 NOTE — Progress Notes (Signed)
Patient complaining with continued back and abdominal pain. Patient encouraged to ambulate. Patient ambulated entire floor with visitors, tolerated well.

## 2020-03-27 NOTE — Progress Notes (Signed)
Problem: Surgical Pathway Day of Surgery  Goal: Activity/Safety  Outcome: Progressing Towards Goal     Problem: Falls - Risk of  Goal: *Absence of Falls  Description: Document Wanda Clark Fall Risk and appropriate interventions in the flowsheet.  Outcome: Progressing Towards Goal  Note: Fall Risk Interventions:            Medication Interventions: Teach patient to arise slowly    Elimination Interventions: Call light in reach

## 2020-03-27 NOTE — ED Provider Notes (Signed)
Patient is a 34 year old female presenting to the emergency department today complaining of right lower quadrant abdominal pain which started on Wednesday.  She said it last for about 10 to 15 minutes that day and was accompanied by heavy menses.  She says she started her menstrual cycle on Sunday and she always has heavy menstrual cycles with the seems to be worse than normal.  The patient said Thursday was pretty normal but then today the pain and bleeding have gotten much worse.  She does have a known history of endometriosis and hemorrhagic ovarian cyst.  Over the past month she has been taking something called "pink stork" which is supposed to be a natural fertility treatment that she bought offline.  Her and her significant other both taking it he started developing canker sores and then she started reading the side effects and it mentioned that it may cause abnormal vaginal bleeding.  The patient is very specific about the abdominal pain stating it is in the right lower quadrant but she also notes she has some pain in the periumbilical area as well.           Past Medical History:   Diagnosis Date   ??? Endometriosis    ??? Hemorrhagic ovarian cyst    ??? Hypertension        Past Surgical History:   Procedure Laterality Date   ??? HX ORTHOPAEDIC           History reviewed. No pertinent family history.    Social History     Socioeconomic History   ??? Marital status: SINGLE     Spouse name: Not on file   ??? Number of children: Not on file   ??? Years of education: Not on file   ??? Highest education level: Not on file   Occupational History   ??? Not on file   Tobacco Use   ??? Smoking status: Current Every Day Smoker   ??? Smokeless tobacco: Never Used   Substance and Sexual Activity   ??? Alcohol use: Yes     Comment: occassionally   ??? Drug use: Not Currently   ??? Sexual activity: Yes     Partners: Male   Other Topics Concern   ??? Not on file   Social History Narrative   ??? Not on file     Social Determinants of Health     Financial  Resource Strain:    ??? Difficulty of Paying Living Expenses:    Food Insecurity:    ??? Worried About Programme researcher, broadcasting/film/video in the Last Year:    ??? Barista in the Last Year:    Transportation Needs:    ??? Freight forwarder (Medical):    ??? Lack of Transportation (Non-Medical):    Physical Activity:    ??? Days of Exercise per Week:    ??? Minutes of Exercise per Session:    Stress:    ??? Feeling of Stress :    Social Connections:    ??? Frequency of Communication with Friends and Family:    ??? Frequency of Social Gatherings with Friends and Family:    ??? Attends Religious Services:    ??? Database administrator or Organizations:    ??? Attends Engineer, structural:    ??? Marital Status:    Intimate Programme researcher, broadcasting/film/video Violence:    ??? Fear of Current or Ex-Partner:    ??? Emotionally Abused:    ??? Physically Abused:    ???  Sexually Abused:          ALLERGIES: Patient has no known allergies.    Review of Systems   Gastrointestinal: Positive for abdominal pain and nausea.   Genitourinary: Positive for pelvic pain and vaginal bleeding.   All other systems reviewed and are negative.      Vitals:    03/27/20 0236   BP: (!) 177/104   Pulse: 90   Resp: 20   Temp: 98.2 ??F (36.8 ??C)   SpO2: 100%   Weight: 85 kg (187 lb 6.3 oz)   Height: 4\' 10"  (1.473 m)            Physical Exam     GENERAL:The patient has Body mass index is 39.16 kg/m??. Well-hydrated.  Obvious discomfort secondary to pain  VITAL SIGNS: Heart rate, blood pressure, respiratory rate reviewed as recorded in  nurse's notes  EYES: Pupils reactive. Extraocular motion intact. No conjunctival redness or drainage.  NECK: Supple, no meningeal signs. Trachea midline. No masses or thyromegaly.  LUNGS: Breath sounds clear and equal bilaterally no accessory muscle use  CHEST: No deformity  CARDIOVASCULAR: Regular rate and rhythm  ABDOMEN: Tenderness in the periumbilical and right lower quadrant.  Positive McBurney sign.  Negative Murphy and Rovsing signs.  Positive bowel sounds in all 4  quadrants.  No rigidity noted.  EXTREMITIES: No clubbing or cyanosis. No joint swelling. Normal muscle tone. No  restricted range of motion appreciated.  NEUROLOGIC: Sensation is grossly intact. Cranial nerve exam reveals face is  symmetrical, tongue is midline speech is clear.  SKIN: No rash or petechiae. Good skin turgor palpated.  PSYCHIATRIC: Alert and oriented. Appropriate behavior and judgment.      MDM  Number of Diagnoses or Management Options  Diagnosis management comments: Constipation, fecal impaction, small bowel obstruction, partial small bowel obstruction,  Ileus    UTI, pyelonephritis, renal colic, ureteral stone     Peptic ulcer disease, esophagitis, GERD    Pancreatitis, pancreatic pseudocyst,    hepatic cirrhosis, GI bleed, esophageal varices, poisoning,    gallbladder disease, cholecystitis, diverticulitis, appendicitis, appendicitis with rupture,    IUP, ectopic pregnancy, threatened abortion, abnormal vaginal bleeding, anemia,  premature rupture of membranes, premature placental separation, incomplete abortion,  false labor, completed abortion,    musculoskeletal pain, ovarian cyst, endometriosis, uterine fibroid, ovarian torsion,                 Amount and/or Complexity of Data Reviewed  Clinical lab tests: ordered and reviewed  Tests in the radiology section of CPT??: ordered and reviewed  Tests in the medicine section of CPT??: ordered and reviewed  Review and summarize past medical records: yes  Independent visualization of images, tracings, or specimens: yes      ED Course as of Mar 27 422   Sun Mar 27, 2020   0419 I spoke with the [facility] hospitalist on call regarding the findings on her ultrasound.  She will come and see the patient    [KH]   0421 I have updated the patient regarding the findings here in the emergency department and her need for additional pain medication and possible surgery this evening.    [KH]   0421 IMPRESSION  1. Prominent sized right ovary with only demonstrable vascular  flow along its  peripheral margin. It is unclear if this is due to ovarian torsion or a complex  cyst within the central portion of the ovary. Dr. [facility] verbally notified at 4:14  AM.  2. Exophytic 4.6 x 5.2 x 3.5 cm hemorrhagic cyst or endometrioma off the left  ovary.    [place] PELV NON OB W TV [KH]   0421 IMPRESSION  1. Enlarged right ovary and left ovary cyst, described on the subsequent  ultrasound of the pelvis report.  2. Prior cholecystectomy.  3. Fat-containing umbilical hernia.   CT ABD PELV W CONT [KH]      ED Course User Index  [KH] Natale Milch, DO       Critical Care  Performed by: Natale Milch, DO  Authorized by: Natale Milch, DO     Comments:      Critical care time: 105 minutes of critical care time was performed in the emergency department. This was separate from any other procedures listed during the patients emergency department course. The failure to initiate these interventions on an urgent basis would likely have resulted in sudden, clinically significant or life-threatening deterioration in the patients condition.

## 2020-03-28 MED ORDER — MEDROXYPROGESTERONE 10 MG TAB
10 mg | ORAL_TABLET | Freq: Every day | ORAL | 0 refills | Status: AC
Start: 2020-03-28 — End: 2020-04-02

## 2020-03-28 MED ORDER — HYDROMORPHONE 1 MG/ML INJECTION SOLUTION
1 mg/mL | Freq: Once | INTRAMUSCULAR | Status: AC
Start: 2020-03-28 — End: 2020-03-27
  Administered 2020-03-28: 01:00:00 via INTRAVENOUS

## 2020-03-28 MED ORDER — HYDROMORPHONE 1 MG/ML INJECTION SOLUTION
1 mg/mL | INTRAMUSCULAR | Status: DC | PRN
Start: 2020-03-28 — End: 2020-03-28

## 2020-03-28 MED FILL — HYDROCODONE-ACETAMINOPHEN 10 MG-325 MG TAB: 10-325 mg | ORAL | Qty: 1

## 2020-03-28 MED FILL — KETOROLAC TROMETHAMINE 30 MG/ML INJECTION: 30 mg/mL (1 mL) | INTRAMUSCULAR | Qty: 1

## 2020-03-28 MED FILL — NIFEDIPINE ER 30 MG 24 HR TAB: 30 mg | ORAL | Qty: 1

## 2020-03-28 MED FILL — HYDROMORPHONE (PF) 1 MG/ML IJ SOLN: 1 mg/mL | INTRAMUSCULAR | Qty: 1

## 2020-03-28 MED FILL — MEDROXYPROGESTERONE 10 MG TAB: 10 mg | ORAL | Qty: 1

## 2020-03-28 NOTE — Progress Notes (Signed)
[facility]    Work/School Note    Date: 03/27/2020    To Whom It May concern:      Wanda Clark was seen and treated today in the hospital by the following provider(s):  Attending Provider: Vickey Huger, MD.   Billey Chang, MD     Wanda Clark had to drive Wanda Clark home from hospital in [place], [place]. She had abdominal surgery.  Please excuse him from work today.      Sincerely,          Wanda Slough, MD

## 2020-03-28 NOTE — Progress Notes (Signed)
Discharge instructions given to patient along with copy. Rxs given.. Morning meds given along with Norco for ride home discomfort 6/10. Excuses given. IV removed tip intact pressure dsg applied.

## 2020-03-28 NOTE — Discharge Summary (Signed)
Discharge Summary by Lindaann Slough, MD at 03/28/20 [phone]                Author: Lindaann Slough, MD  Service: Obstetrics & Gynecology  Author Type: Physician       Filed: 03/28/20 0921  Date of Service: 03/28/20 0917  Status: Signed          Editor: Lindaann Slough, MD (Physician)                                   Physician Discharge Summary               Patient: Wanda Clark  MRN: [id number]   SSN: [id number]      Date of Birth: [birth year]   Age: 34 y.o.   Sex: female      PCP: Other, Phys, MD      Allergies: Patient has no known allergies.      Admit date: 03/27/2020   Admitting Provider: Vickey Huger, MD      Discharge date: 03/28/2020   Discharging Provider: Lindaann Slough, MD      * Admission Diagnoses: Ovarian torsion [N83.519]      * Discharge Diagnoses:        Hospital Problems  as of 03/28/2020  Never Reviewed                     Codes  Class  Noted - Resolved  POA              * (Principal) Hemorrhagic ovarian cyst  ICD-10-CM: N83.209   ICD-9-CM: 620.2    03/27/2020 - Present  Yes                          * Hospital Course: pt did well after surgery for suspected ovarian torsion. Noted to not have torsion, but have hemorrhagic cyst.      * Procedures: diagnostic laparoscope and drainage ovarian cyst   Procedure(s):   LAPAROSCOPY GENERAL DIAGNOSTIC               Significant Diagnostic Studies: radiology: Ultrasound:    Discharge Exam:   Visit Vitals      BP  106/66 (BP 1 Location: Right upper arm, BP Patient Position: At rest)     Pulse  (!) 53     Temp  98.6 ??F (37 ??C)     Resp  18     Ht  4\' 10"  (1.473 m)     Wt  85 kg (187 lb 6.3 oz)     SpO2  92%     Breastfeeding  No        BMI  39.16 kg/m??        General appearance: alert, cooperative, no distress   Abdomen: soft, non-tender. Bowel sounds normal. No masses,  no organomegaly   Mild distension; pt with flatus   Incisions clean, dry , intact      * Discharge Condition: good   * Disposition: Home      Discharge  Medications:     Current Discharge Medication List              START taking these medications          Details        medroxyPROGESTERone (PROVERA) 10 mg tablet  Take 1 Tablet by  mouth daily for 5 days.   Qty: 5 Tablet, Refills:  0   Start date: 03/28/2020, End date:  04/02/2020               NIFEdipine ER (PROCARDIA XL) 30 mg ER tablet  Take 1 Tablet by mouth daily for 120 days. Start to control hypertension. Safe to take in pregnancy.   Qty: 30 Tablet, Refills:  3   Start date: 03/27/2020, End date:  07/25/2020               HYDROcodone-acetaminophen (Norco) 5-325 mg per tablet  Take 1 Tablet by mouth every six (6) hours as needed for Pain for up to 7 days. Max Daily Amount: 4 Tablets.   Qty: 20 Tablet, Refills:  0   Start date: 03/27/2020, End date:  04/03/2020          Associated Diagnoses: Abdominal pain, right lower quadrant; Cyst of right ovary               promethazine (PHENERGAN) 12.5 mg tablet  Take 2 Tablets by mouth every six (6) hours as needed for Nausea for up to 7 days.   Qty: 20 Tablet, Refills:  0   Start date: 03/27/2020, End date:  04/03/2020                         * Follow-up Care/Patient Instructions:   Activity: Activity as tolerated   Diet: Regular Diet   Wound Care: Keep wound clean and dry        Follow-up Information               Follow up With  Specialties  Details  Why  Contact Info              Other, Phys, MD        Patient can only remember the practice name and not the physician             f/u with her doctor in [facility] for bp and for pelvic pain      Signed:   Lindaann Slough, MD   03/28/2020   9:18 AM

## 2020-03-28 NOTE — Progress Notes (Signed)
Problem: Patient Education: Go to Patient Education Activity  Goal: Patient/Family Education  Outcome: Resolved/Met     Problem: Surgical Pathway Day of Surgery  Goal: Off Pathway (Use only if patient is Off Pathway)  Outcome: Resolved/Met  Goal: Activity/Safety  Outcome: Resolved/Met     Problem: Falls - Risk of  Goal: *Absence of Falls  Description: Document Schmid Fall Risk and appropriate interventions in the flowsheet.  Outcome: Resolved/Met  Note: Fall Risk Interventions:            Medication Interventions: Teach patient to arise slowly    Elimination Interventions: Call light in reach              Problem: Patient Education: Go to Patient Education Activity  Goal: Patient/Family Education  Outcome: Resolved/Met

## 2020-03-30 ENCOUNTER — Encounter (HOSPITAL_COMMUNITY): Payer: Self-pay | Admitting: *Deleted

## 2020-03-30 ENCOUNTER — Emergency Department (HOSPITAL_COMMUNITY)
Admission: EM | Admit: 2020-03-30 | Discharge: 2020-03-30 | Disposition: A | Discharge status: Home / Self Care | Payer: Medicaid Other | Attending: Emergency Medicine | Admitting: Emergency Medicine

## 2020-03-30 ENCOUNTER — Emergency Department (HOSPITAL_COMMUNITY)
Admission: EM | Admit: 2020-03-30 | Discharge: 2020-03-30 | Disposition: A | Discharge status: Home / Self Care | Payer: Self-pay | Attending: Emergency Medicine | Admitting: Emergency Medicine

## 2020-03-30 ENCOUNTER — Emergency Department (HOSPITAL_COMMUNITY): Payer: Self-pay

## 2020-03-30 ENCOUNTER — Other Ambulatory Visit: Payer: Self-pay

## 2020-03-30 ENCOUNTER — Encounter (HOSPITAL_COMMUNITY): Payer: Self-pay | Admitting: Emergency Medicine

## 2020-03-30 DIAGNOSIS — Z20822 Contact with and (suspected) exposure to covid-19: Secondary | ICD-10-CM | POA: Insufficient documentation

## 2020-03-30 DIAGNOSIS — I10 Essential (primary) hypertension: Secondary | ICD-10-CM | POA: Insufficient documentation

## 2020-03-30 DIAGNOSIS — N939 Abnormal uterine and vaginal bleeding, unspecified: Secondary | ICD-10-CM | POA: Insufficient documentation

## 2020-03-30 DIAGNOSIS — Z9889 Other specified postprocedural states: Secondary | ICD-10-CM

## 2020-03-30 DIAGNOSIS — R102 Pelvic and perineal pain: Secondary | ICD-10-CM

## 2020-03-30 DIAGNOSIS — F17210 Nicotine dependence, cigarettes, uncomplicated: Secondary | ICD-10-CM | POA: Insufficient documentation

## 2020-03-30 DIAGNOSIS — Z79899 Other long term (current) drug therapy: Secondary | ICD-10-CM | POA: Insufficient documentation

## 2020-03-30 DIAGNOSIS — R9389 Abnormal findings on diagnostic imaging of other specified body structures: Secondary | ICD-10-CM | POA: Insufficient documentation

## 2020-03-30 DIAGNOSIS — J45909 Unspecified asthma, uncomplicated: Secondary | ICD-10-CM | POA: Insufficient documentation

## 2020-03-30 DIAGNOSIS — Z5321 Procedure and treatment not carried out due to patient leaving prior to being seen by health care provider: Secondary | ICD-10-CM | POA: Insufficient documentation

## 2020-03-30 DIAGNOSIS — R109 Unspecified abdominal pain: Secondary | ICD-10-CM | POA: Insufficient documentation

## 2020-03-30 DIAGNOSIS — R1031 Right lower quadrant pain: Secondary | ICD-10-CM | POA: Insufficient documentation

## 2020-03-30 LAB — CBC
HCT: 43.5 % (ref 36.0–46.0)
Hemoglobin: 13.8 g/dL (ref 12.0–15.0)
MCH: 27.4 pg (ref 26.0–34.0)
MCHC: 31.7 g/dL (ref 30.0–36.0)
MCV: 86.5 fL (ref 80.0–100.0)
Platelets: 404 10*3/uL — ABNORMAL HIGH (ref 150–400)
RBC: 5.03 MIL/uL (ref 3.87–5.11)
RDW: 15.1 % (ref 11.5–15.5)
WBC: 10.7 10*3/uL — ABNORMAL HIGH (ref 4.0–10.5)
nRBC: 0 % (ref 0.0–0.2)

## 2020-03-30 LAB — COMPREHENSIVE METABOLIC PANEL
ALT: 28 U/L (ref 0–44)
AST: 24 U/L (ref 15–41)
Albumin: 3.6 g/dL (ref 3.5–5.0)
Alkaline Phosphatase: 75 U/L (ref 38–126)
Anion gap: 11 (ref 5–15)
BUN: 11 mg/dL (ref 6–20)
CO2: 27 mmol/L (ref 22–32)
Calcium: 9.2 mg/dL (ref 8.9–10.3)
Chloride: 100 mmol/L (ref 98–111)
Creatinine, Ser: 1.01 mg/dL — ABNORMAL HIGH (ref 0.44–1.00)
GFR calc Af Amer: >60 mL/min (ref >60)
GFR calc non Af Amer: >60 mL/min (ref >60)
Glucose, Bld: 111 mg/dL — ABNORMAL HIGH (ref 70–99)
Potassium: 3.5 mmol/L (ref 3.5–5.1)
Sodium: 138 mmol/L (ref 135–145)
Total Bilirubin: 0.4 mg/dL (ref 0.3–1.2)
Total Protein: 6.8 g/dL (ref 6.5–8.1)

## 2020-03-30 LAB — WET PREP, GENITAL
Clue Cells Wet Prep HPF POC: NONE SEEN
Sperm: NONE SEEN
Trich, Wet Prep: NONE SEEN
Yeast Wet Prep HPF POC: NONE SEEN

## 2020-03-30 LAB — SARS CORONAVIRUS 2 BY RT PCR (HOSPITAL ORDER, PERFORMED IN ~~LOC~~ HOSPITAL LAB): SARS Coronavirus 2: NEGATIVE

## 2020-03-30 LAB — I-STAT BETA HCG BLOOD, ED (MC, WL, AP ONLY): I-stat hCG, quantitative: <5 m[IU]/mL (ref <5)

## 2020-03-30 MED ORDER — HYDROMORPHONE HCL 1 MG/ML IJ SOLN
1.0000 mg | Freq: Once | INTRAMUSCULAR | Status: AC
  Administered 2020-03-30: 1 mg via INTRAVENOUS
  Filled 2020-03-30: qty 1

## 2020-03-30 MED ORDER — FENTANYL CITRATE (PF) 100 MCG/2ML IJ SOLN
100.0000 ug | Freq: Once | INTRAMUSCULAR | Status: AC
  Administered 2020-03-30: 100 ug via INTRAVENOUS
  Filled 2020-03-30: qty 2

## 2020-03-30 MED ORDER — OXYCODONE-ACETAMINOPHEN 5-325 MG PO TABS: 1.0000 | ORAL_TABLET | Freq: Four times a day (QID) | ORAL | 0 refills | Status: DC | PRN

## 2020-03-30 MED ORDER — SODIUM CHLORIDE 0.9% FLUSH: 3.0000 mL | Freq: Once | INTRAVENOUS | Status: DC

## 2020-03-30 MED ORDER — DICLOFENAC SODIUM 75 MG PO TBEC
75.0000 mg | DELAYED_RELEASE_TABLET | Freq: Two times a day (BID) | ORAL | 0 refills | Status: DC | PRN
Start: 2020-03-30 — End: 2020-06-22

## 2020-03-30 MED ORDER — IOHEXOL 300 MG/ML  SOLN
100.0000 mL | Freq: Once | INTRAMUSCULAR | Status: AC | PRN
  Administered 2020-03-30: 100 mL via INTRAVENOUS

## 2020-03-30 MED ORDER — KETOROLAC TROMETHAMINE 30 MG/ML IJ SOLN
30.0000 mg | Freq: Once | INTRAMUSCULAR | Status: AC
  Administered 2020-03-30: 30 mg via INTRAVENOUS
  Filled 2020-03-30: qty 1

## 2020-03-30 MED ORDER — FENTANYL CITRATE (PF) 100 MCG/2ML IJ SOLN: 50.0000 ug | INTRAMUSCULAR | Status: DC | PRN

## 2020-03-30 NOTE — ED Provider Notes (Signed)
Brief update note  34 year old lady presented to ER with concern for acute severe right lower abdominal pain in setting of recent ovarian torsion.  Treated in [place].  Not established with OB/GYN in [place].  Initially went to [facility], ultrasound there was degree of heterogeneity within the right ovary but no definitive pathology. On left adnexa there was partially cystic mass. GYN was consulted and rec transfer to [facility] for eval here. [facility], patient with continuing abdominal pain. Discussed with OBGYN, she will come evaluate and perform pelvic, requests patient get CT to eval for other causes.   While awaiting CT results and Gyn recommendations, patient signed out to Dr. Vallery Ridge. Refer to her note for final plan/dispo.     Lucrezia Starch, MD 03/30/20 [phone]

## 2020-03-30 NOTE — ED Notes (Signed)
Charge nurse was notified request for treatment room. Pt was taken to restroom, was going to bring pt back to triage until treatment room available, discussed with pt the same. pt says she is leaving, "because no one is taking me seriously". Pt sig other wheeled her in w/c through lobby
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
## 2020-03-30 NOTE — ED Notes (Addendum)
Pt in a lot of lower abd pain. Pain medication given, no active bleeding.

## 2020-03-30 NOTE — ED Triage Notes (Addendum)
Patient is complaining of right abdominal pain and vaginal bleeding. Patient states this started this morning. Patient had surgery on Sunday for a twisted ovary. Patient is crying hysterically. Patient was at [facility] and had labs drawn there.

## 2020-03-30 NOTE — ED Notes (Signed)
Report called to Lenna Sciara, RN charge at [facility] ED. Charge RN stated she will call back.

## 2020-03-30 NOTE — ED Notes (Signed)
Pelvic exam done by Dr. Ernestina Patches and Lavella Lemons - EMT assisted.

## 2020-03-30 NOTE — ED Provider Notes (Signed)
[facility] DEPT Provider Note   CSN: [id number] Arrival date & time: 03/30/20  8315     History Chief Complaint  Patient presents with  . Abdominal Pain    Samantha Yoder is a 34 y.o. female presenting for evaluation of abdominal pain.  Patient states she woke up around 5 AM this morning with severe right sided lower abdominal pain.  She states she had an ovarian torsion this past weekend, 4 days ago, and was treated in [place].  Her pain was the same at that time.  She has had no pain since then until today.  Patient states she also had vaginal bleeding this morning, although this is improving and almost resolved.  She denies fevers, cough, chest pain, shortness of breath, nausea, vomiting.  She states she has not had a bowel movement in over 24 hours, which is abnormal for her.  She took [place] yesterday without improvement.  She feels some pressure in her rectum.  She has a history of endometriosis, kidney stones, hypertension.  Additional history obtained from chart review.  Reviewed note from 7/25 when patient had surgery for torsion.  Reviewed lab work obtained from [facility] prior to coming to [place].  HPI     Past Medical History:  Diagnosis Date  . Asthma   . Deliberate self-cutting   . Endometriosis   . Epistaxis   . Hypertension   . Kidney stones   . Kidney stones     Patient Active Problem List   Diagnosis Date Noted  . Essential hypertension 10/29/2018  . Tobacco use 10/29/2018  . Endometriosis determined by laparoscopy 03/23/2016  . 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 CHOLECYSTECTOMY    . laproscopy        OB History    Gravida  1   Para  1   Term      Preterm  1   AB      Living  1     SAB      TAB      Ectopic      Multiple      Live Births  1            Family History  Problem Relation Age of Onset  . COPD Mother   . Depression Mother   . Hypertension Mother   . Hypertension Father   . Drug abuse Brother   . Cancer Maternal Aunt   . Hyperlipidemia Maternal Uncle   . Miscarriages / Stillbirths Maternal Uncle   . Diabetes Maternal Grandmother   . Miscarriages / Stillbirths Paternal Grandmother     Social History   Tobacco Use  . Smoking status: Current Every Day Smoker    Packs/day: 0.50    Years: 0.00    Pack years: 0.00    Types: Cigarettes  . Smokeless tobacco: Never Used  Vaping Use  . Vaping Use: Never used  Substance Use Topics  . Alcohol use: Yes    Comment: occ  . Drug use: No    Home Medications Prior to Admission medications   Medication Sig Start Date End Date Taking? Authorizing Provider  acetaminophen (TYLENOL) 325 MG tablet Take 650 mg by mouth every 6 (six) hours as needed for mild pain or headache.   Yes [provider]  HYDROcodone-acetaminophen (NORCO/VICODIN) 5-325 MG tablet Take 1 tablet by mouth  every 6 (six) hours as needed for pain. 03/27/20 04/03/20 Yes [provider]  medroxyPROGESTERone (PROVERA) 10 MG tablet Take 10 mg by mouth daily. 5 day supply 03/28/20 04/02/20 Yes [provider]  NIFEdipine (PROCARDIA-XL/NIFEDICAL-XL) 30 MG 24 hr tablet Take 30 mg by mouth daily. 03/27/20 07/25/20 Yes [provider]  amLODipine (NORVASC) 10 MG tablet Take 1 tablet (10 mg total) by mouth daily. Patient not taking: Reported on 03/31/2019 10/29/18   Caren Macadam, MD  fluconazole (DIFLUCAN) 150 MG tablet Take 1 tablet (150 mg total) by mouth daily. Take second dose 72 hours later if symptoms still persists. Patient not taking: Reported on 03/31/2019 03/12/19   Anyanwu, Sallyanne Havers, MD  lidocaine (LIDODERM) 5 % Place 1 patch onto the skin daily. Remove & Discard patch within 12 hours or as directed by MD Patient not taking: Reported on 03/30/2020 05/29/19   Ward, Ozella Almond, PA-C  naproxen (NAPROSYN) 500 MG tablet Take 1 tablet (500 mg total) by mouth 2 (two) times daily as needed. Patient not taking: Reported on 03/30/2020 05/29/19   Ward, Ozella Almond, PA-C  promethazine (PHENERGAN) 12.5 MG tablet Take 12.5 mg by mouth every 6 (six) hours as needed for nausea/vomiting. 03/27/20 04/03/20  [provider]    Allergies    Other  Review of Systems   Review of Systems  Gastrointestinal: Positive for abdominal pain.  Genitourinary: Positive for vaginal bleeding.  All other systems reviewed and are negative.   Physical Exam Updated Vital Signs BP (!) 157/94   Pulse 78   Temp 98.8 F (37.1 C) (Oral)   Resp 18   Ht 4' 10.5" (1.486 m)   Wt 83.5 kg   SpO2 97%   BMI 37.80 kg/m   Physical Exam Vitals and nursing note reviewed.  Constitutional:      General: She is not in acute distress.    Appearance: She is well-developed.     Comments: Appears uncomfortable due to pain.  HENT:     Head: Normocephalic and atraumatic.  Eyes:     Extraocular Movements: Extraocular movements intact.     Conjunctiva/sclera: Conjunctivae normal.     Pupils: Pupils are equal, round, and reactive to light.  Cardiovascular:     Rate and Rhythm: Normal rate and regular rhythm.     Pulses: Normal pulses.  Pulmonary:     Effort: Pulmonary effort is normal. No respiratory distress.     Breath sounds: Normal breath sounds. No wheezing.  Abdominal:     General: There is no distension.     Palpations: Abdomen is soft. There is no mass.     Tenderness: There is abdominal tenderness. There is no guarding or rebound.     Comments: Generalized tenderness palpation the abdomen.  Worse in the right lower quadrant.  Incision site without erythema, warmth, or induration  Musculoskeletal:        General: Normal range of motion.     Cervical back: Normal range of motion and neck supple.  Skin:    General: Skin is warm and dry.     Capillary Refill: Capillary refill  takes less than 2 seconds.  Neurological:     Mental Status: She is alert and oriented to person, place, and time.     ED Results / Procedures / Treatments   Labs (all labs ordered are listed, but only abnormal results are displayed) Labs Reviewed  SARS CORONAVIRUS 2 BY RT PCR (HOSPITAL ORDER, [facility])  WET PREP, GENITAL  GC/CHLAMYDIA PROBE AMP () NOT AT [facility]    EKG None  Radiology US Transvaginal Non-OB  Addendum Date: 03/30/2020   ADDENDUM REPORT: 03/30/2020 11:13 ADDENDUM: There is a degree of heterogeneity within the right ovary without well-defined mass. Etiology for this heterogeneity uncertain. Possible hemorrhage within a corpus luteum could present in this manner. Particular attention to this area on subsequent evaluations advised. Electronically Signed   By: Lowella Grip III M.D.   On: 03/30/2020 11:13   Result Date: 03/30/2020 CLINICAL DATA:  Pelvic pain with vaginal bleeding. Patient reports recent ovarian torsion detected at outside facility EXAM: TRANSABDOMINAL AND TRANSVAGINAL ULTRASOUND OF PELVIS DOPPLER ULTRASOUND OF OVARIES TECHNIQUE: Study was performed transabdominally to optimize pelvic field of view evaluation and transvaginally to optimize internal visceral architecture evaluation. Color and duplex Doppler ultrasound was utilized to evaluate blood flow to the ovaries. COMPARISON:  None. FINDINGS: Uterus Measurements: 7.6 x 3.5 x 4.8 cm = volume: 66.4 mL. No fibroids or other mass visualized. Endometrium Thickness: 6 mm.  No focal abnormality visualized. Right ovary Measurements: 4.8 x 4.9 x 3.9 cm = volume: 47.8 mL. Normal appearance/no adnexal mass. Left ovary Measurements: 5.7 x 4.3 x 6.0 cm = volume: 76.0 mL. There is a complex predominantly cystic left adnexal mass measuring 4.9 x 3.8 x 5.0 cm. There appears to be debris in this lesion as well as a potential solid focus measuring approximately 1 x 1 cm. Pulsed Doppler  evaluation of both ovaries demonstrates normal low-resistance arterial and venous waveforms. Other findings No abnormal free fluid. IMPRESSION: 1. There is a complex partially cystic left adnexal mass measuring 4.9 x 3.8 x 5.0 cm. Differential considerations for this mass include hemorrhagic cyst, endometrioma, tubo-ovarian abscess, and atypical ovarian neoplasm. This finding warrants gynecologic assessment. At a minimum, a repeat ultrasound in approximately 4 weeks to assess for stability of this lesion advised. 2. No other extrauterine pelvic mass. No intrauterine lesion or endometrial thickening. 3. Low resistance waveform seen in each ovary. No findings suggesting ovarian torsion at this time. Electronically Signed: By: Lowella Grip III M.D. On: 03/30/2020 10:26   US Pelvis Complete  Addendum Date: 03/30/2020   ADDENDUM REPORT: 03/30/2020 11:13 ADDENDUM: There is a degree of heterogeneity within the right ovary without well-defined mass. Etiology for this heterogeneity uncertain. Possible hemorrhage within a corpus luteum could present in this manner. Particular attention to this area on subsequent evaluations advised. Electronically Signed   By: Lowella Grip III M.D.   On: 03/30/2020 11:13   Result Date: 03/30/2020 CLINICAL DATA:  Pelvic pain with vaginal bleeding. Patient reports recent ovarian torsion detected at outside facility EXAM: TRANSABDOMINAL AND TRANSVAGINAL ULTRASOUND OF PELVIS DOPPLER ULTRASOUND OF OVARIES TECHNIQUE: Study was performed transabdominally to optimize pelvic field of view evaluation and transvaginally to optimize internal visceral architecture evaluation. Color and duplex Doppler ultrasound was utilized to evaluate blood flow to the ovaries. COMPARISON:  None. FINDINGS: Uterus Measurements: 7.6 x 3.5 x 4.8 cm = volume: 66.4 mL. No fibroids or other mass visualized. Endometrium Thickness: 6 mm.  No focal abnormality visualized. Right ovary Measurements: 4.8 x 4.9 x 3.9  cm = volume: 47.8 mL. Normal appearance/no adnexal mass. Left ovary Measurements: 5.7 x 4.3 x 6.0 cm = volume: 76.0 mL. There is a complex predominantly cystic left adnexal mass measuring 4.9 x 3.8 x 5.0 cm. There appears to be debris in this lesion as well as a potential solid focus measuring approximately  1 x 1 cm. Pulsed Doppler evaluation of both ovaries demonstrates normal low-resistance arterial and venous waveforms. Other findings No abnormal free fluid. IMPRESSION: 1. There is a complex partially cystic left adnexal mass measuring 4.9 x 3.8 x 5.0 cm. Differential considerations for this mass include hemorrhagic cyst, endometrioma, tubo-ovarian abscess, and atypical ovarian neoplasm. This finding warrants gynecologic assessment. At a minimum, a repeat ultrasound in approximately 4 weeks to assess for stability of this lesion advised. 2. No other extrauterine pelvic mass. No intrauterine lesion or endometrial thickening. 3. Low resistance waveform seen in each ovary. No findings suggesting ovarian torsion at this time. Electronically Signed: By: Lowella Grip III M.D. On: 03/30/2020 10:26   [place] Art/Ven Flow Abd Pelv Doppler  Addendum Date: 03/30/2020   ADDENDUM REPORT: 03/30/2020 11:13 ADDENDUM: There is a degree of heterogeneity within the right ovary without well-defined mass. Etiology for this heterogeneity uncertain. Possible hemorrhage within a corpus luteum could present in this manner. Particular attention to this area on subsequent evaluations advised. Electronically Signed   By: Lowella Grip III M.D.   On: 03/30/2020 11:13   Result Date: 03/30/2020 CLINICAL DATA:  Pelvic pain with vaginal bleeding. Patient reports recent ovarian torsion detected at outside facility EXAM: TRANSABDOMINAL AND TRANSVAGINAL ULTRASOUND OF PELVIS DOPPLER ULTRASOUND OF OVARIES TECHNIQUE: Study was performed transabdominally to optimize pelvic field of view evaluation and transvaginally to optimize internal  visceral architecture evaluation. Color and duplex Doppler ultrasound was utilized to evaluate blood flow to the ovaries. COMPARISON:  None. FINDINGS: Uterus Measurements: 7.6 x 3.5 x 4.8 cm = volume: 66.4 mL. No fibroids or other mass visualized. Endometrium Thickness: 6 mm.  No focal abnormality visualized. Right ovary Measurements: 4.8 x 4.9 x 3.9 cm = volume: 47.8 mL. Normal appearance/no adnexal mass. Left ovary Measurements: 5.7 x 4.3 x 6.0 cm = volume: 76.0 mL. There is a complex predominantly cystic left adnexal mass measuring 4.9 x 3.8 x 5.0 cm. There appears to be debris in this lesion as well as a potential solid focus measuring approximately 1 x 1 cm. Pulsed Doppler evaluation of both ovaries demonstrates normal low-resistance arterial and venous waveforms. Other findings No abnormal free fluid. IMPRESSION: 1. There is a complex partially cystic left adnexal mass measuring 4.9 x 3.8 x 5.0 cm. Differential considerations for this mass include hemorrhagic cyst, endometrioma, tubo-ovarian abscess, and atypical ovarian neoplasm. This finding warrants gynecologic assessment. At a minimum, a repeat ultrasound in approximately 4 weeks to assess for stability of this lesion advised. 2. No other extrauterine pelvic mass. No intrauterine lesion or endometrial thickening. 3. Low resistance waveform seen in each ovary. No findings suggesting ovarian torsion at this time. Electronically Signed: By: Lowella Grip III M.D. On: 03/30/2020 10:26    Procedures Procedures (including critical care time)  Medications Ordered in ED Medications  fentaNYL (SUBLIMAZE) injection 100 mcg (100 mcg Intravenous Given 03/30/20 0846)  HYDROmorphone (DILAUDID) injection 1 mg (1 mg Intravenous Given 03/30/20 0932)  HYDROmorphone (DILAUDID) injection 1 mg (1 mg Intravenous Given 03/30/20 1118)    ED Course  I have reviewed the triage vital signs and the nursing notes.  Pertinent labs & imaging results that were available  during my care of the patient were reviewed by me and considered in my medical decision making (see chart for details).    MDM Rules/Calculators/A&P                          Pt  presenting for evaluation of severe right lower quadrant abdominal pain and vaginal bleeding.  On exam, patient appears extremely comfortable.  She does have diffuse tenderness palpation the abdomen, worse in the right lower quadrant.  This is concerning in the setting of recent ovarian torsion on the side.  Labs obtained from [place], overall reassuring.  Hemoglobin stable.  Will treat pain and obtain ultrasound.  Pain is still uncontrolled with under fentanyl.  Will give Dilaudid.  Ultrasound shows large complex cyst of the left side, recommend GYN evaluation.  Additionally, shows heterogenicity of the right side ovary.  I discussed with patient.  Recommended a pelvic, patient declined.  Discussed that pelvic would be helpful for determining more specific location of pain, amount of bleeding, or further evaluation.  Patient states she understands, but still declines.  Will consult with OB/GYN.  Discussed with on-call OB/GYN who recommends ED to ED transfer so they can evaluate the patient.  Requests ED provider call OB/GYN when patient is at the [facility] emergency department.  Once again with severe pain.  Will give another round of dilaudid. Based on pt's pain, if she is cleared by gyn, consider need for reassessment and possible CT.   Final Clinical Impression(s) / ED Diagnoses Final diagnoses:  RLQ abdominal pain  Vaginal bleeding  Abnormal pelvic ultrasound    Rx / DC Orders ED Discharge Orders    None       Franchot Heidelberg, PA-C 03/30/20 1210    Davonna Belling, MD 03/30/20 1458

## 2020-03-30 NOTE — ED Triage Notes (Addendum)
Pt arrives with c/o onset of sharp right sided abdominal pain and vaginal bleeding, onset this morning, (bright red vaginal bleeding). Pt states that she had surgery on Sunday for a twisted ovary in [place], reporting they only removed the ovarian cyst and she was to f/u with her OBGYN, has not yet. Last took hydrocodone for pain around midnight. Restless in triage.

## 2020-03-30 NOTE — ED Notes (Signed)
Per Franchot Heidelberg, PA pt to transfer to [facility] ED via [place]. [facility] called.

## 2020-03-30 NOTE — Discharge Instructions (Signed)

## 2020-03-30 NOTE — Consult Note (Signed)
OB/GYN Consult Note  Referring Provider: Dr. Jeannine Kitten  is a 34 y.o. G1P0101 with history of multiple abdominal and MSK surgeries, endometriosis and HTN  presenting for pelvic pain after recently surgery. Gyn consulted for concern for torsion vs other GYN pathology.  Seen in conjunction with Dr. Arvilla Meres.  Patient had diagnostic lap on Sunday for suspected torsion but it was found that she had large partially rupture right hemorrhagic cyst. She reports she had similar pain starting today. Reports mostly right sided pain today. She report onset of vaginal bleeding about 4 days ago and she thinks is her period coming early. She reports being on a OTC fertility supplement until last week that she thinks disrupted her cycle.      Past Medical History:  Diagnosis Date  . Asthma   . Deliberate self-cutting   . Endometriosis   . Epistaxis   . Hypertension   . Kidney stones   . Kidney stones    Past Surgical History:  Procedure Laterality Date  . CHOLECYSTECTOMY    . laproscopy      OB History  Gravida Para Term Preterm AB Living  1 1   1   1   SAB TAB Ectopic Multiple Live Births          1    # Outcome Date GA Lbr Len/2nd Weight Sex Delivery Anes PTL Lv  1 Preterm 2001     CS-LTranv   LIV    Social History   Socioeconomic History  . Marital status: Single    Spouse name: Not on file  . Number of children: Not on file  . Years of education: Not on file  . Highest education level: Not on file  Occupational History  . Not on file  Tobacco Use  . Smoking status: Current Every Day Smoker    Packs/day: 0.50    Years: 0.00    Pack years: 0.00    Types: Cigarettes  . Smokeless tobacco: Never Used  Vaping Use  . Vaping Use: Never used  Substance and Sexual Activity  . Alcohol use: Yes    Comment: occ  . Drug use: No  . Sexual activity: Yes    Birth control/protection: None  Other Topics Concern  . Not on file  Social History Narrative   ** Merged  History Encounter **       Social Determinants of Health   Financial Resource Strain:   . Difficulty of Paying Living Expenses:   Food Insecurity:   . Worried About Charity fundraiser in the Last Year:   . Arboriculturist in the Last Year:   Transportation Needs:   . Film/video editor (Medical):   [place] Kitchen Lack of Transportation (Non-Medical):   Physical Activity:   . Days of Exercise per Week:   . Minutes of Exercise per Session:   Stress:   . Feeling of Stress :   Social Connections:   . Frequency of Communication with Friends and Family:   . Frequency of Social Gatherings with Friends and Family:   . Attends Religious Services:   . Active Member of Clubs or Organizations:   . Attends Archivist Meetings:   Marland Kitchen Marital Status:    Family History  Problem Relation Age of Onset  . COPD Mother   . Depression Mother   . Hypertension Mother   . Hypertension Father   . Drug abuse Brother   . Cancer Maternal Aunt   .  Hyperlipidemia Maternal Uncle   . Miscarriages / Stillbirths Maternal Uncle   . Diabetes Maternal Grandmother   . Miscarriages / Stillbirths Paternal Grandmother      (Not in a hospital admission)   Allergies  Allergen Reactions  . Other Itching    clorox makes her "itch really bad". Allergy to "fresh roses" also.      Review of Systems: Negative except for what is mentioned in HPI.     Physical Exam: BP (!) 140/86 (BP Location: Right Arm)   Pulse 66   Temp 98.9 F (37.2 C) (Oral)   Resp 19   Ht 4' 10.5" (1.486 m)   Wt 83.5 kg   SpO2 98%   BMI 37.80 kg/m   CONSTITUTIONAL: Well-developed, well-nourished female in no acute distress. Appears uncomfortable with movement.  HENT:  Normocephalic, atraumatic, External right and left ear normal. Oropharynx is clear and moist EYES: Conjunctivae and EOM are normal. Pupils are equal, round, and reactive to light. No scleral icterus.  NECK: Normal range of motion, supple, no masses SKIN: Skin is  warm and dry. No rash noted. Not diaphoretic. No erythema. No pallor. [place]: Alert and oriented to person, place, and time. Normal reflexes, muscle tone coordination. No cranial nerve deficit noted. PSYCHIATRIC: Normal mood and affect. Normal behavior. Normal judgment and thought content. CARDIOVASCULAR: Normal heart rate noted, regular rhythm RESPIRATORY: Effort  normal, no problems with respiration noted ABDOMEN: Soft. Diffusely tender to palpation and with movement.  nondistended, gravid.  Port sites from recent lap are clean/dry/intact PELVIC: Normally appearing external genitalia. Normal vagina. No discharge noted. + blood in vault, coming from cervical os. Speculum exam is notably uncomfortable. No CMT. No adnexa mass palpated. Voluntary guarding with bimanual.  MUSCULOSKELETAL: Normal range of motion. No edema and no tenderness. 2+ distal pulses.  Pelvic exam done with chaperone present (Dr Arvilla Meres)  Pertinent Labs/Studies:   Results for orders placed or performed during the hospital encounter of 03/30/20 (from the past 72 hour(s))  SARS Coronavirus 2 by RT PCR (hospital order, performed in [facility] hospital lab) Nasopharyngeal Nasopharyngeal Swab     Status: None   Collection Time: 03/30/20  9:05 AM   Specimen: Nasopharyngeal Swab  Result Value Ref Range   SARS Coronavirus 2 NEGATIVE NEGATIVE    Comment: (NOTE) SARS-CoV-2 target nucleic acids are NOT DETECTED.  The SARS-CoV-2 RNA is generally detectable in upper and lower respiratory specimens during the acute phase of infection. The lowest concentration of SARS-CoV-2 viral copies this assay can detect is 250 copies / mL. A negative result does not preclude SARS-CoV-2 infection and should not be used as the sole basis for treatment or other patient management decisions.  A negative result may occur with improper specimen collection / handling, submission of specimen other than nasopharyngeal swab, presence of viral  mutation(s) within the areas targeted by this assay, and inadequate number of viral copies (<250 copies / mL). A negative result must be combined with clinical observations, patient history, and epidemiological information.  Fact Sheet for Patients:   StrictlyIdeas.no  Fact Sheet for Healthcare Providers: BankingDealers.co.za  This test is not yet approved or  cleared by the [place] FDA and has been authorized for detection and/or diagnosis of SARS-CoV-2 by FDA under an Emergency Use Authorization (EUA).  This EUA will remain in effect (meaning this test can be used) for the duration of the COVID-19 declaration under Section 564(b)(1) of the Act, 21 U.S.C. section 360bbb-3(b)(1), unless the authorization is  terminated or revoked sooner.  Performed at [facility], [place] [address], [place], [place] [address]   Wet prep, genital     Status: Abnormal   Collection Time: 03/30/20  5:00 PM  Result Value Ref Range   Yeast Wet Prep HPF POC NONE SEEN NONE SEEN   Trich, Wet Prep NONE SEEN NONE SEEN   Clue Cells Wet Prep HPF POC NONE SEEN NONE SEEN   WBC, Wet Prep HPF POC MANY (A) NONE SEEN   Sperm NONE SEEN     Comment: Performed at [facility], [place] [address], [place], [place] [address]        Assessment and Plan Samantha Yoder is a 34 y.o. G1P0101 presenting with pelvic pain in the setting of recent diagnostic lap with cyst removal/rupture.    #PELVIC PAIN  - likely post operative inflammatory pain. CT and [place] are reassuring - Will give Todarol 30mg  IV now  - rx for diclofenac 75mg  BID - rx for percocet sent #30 - Follow up at [facility] with Dr. Rosana Hoes. Message sent to office to schedule patient. Patient willing to wait for Dr. Rosana Hoes appointment.   Thank you for this consult,  please call or re-consult with further questions.  For OB/GYN questions, please call the Center for  [facility] [place] Monday - Friday, 8 am - 5 pm: [phone] All other times: [phone]   Caren Macadam, MD, MPH, ABFM, [facility] Attending Physician Center for [facility]

## 2020-03-30 NOTE — ED Notes (Signed)
[facility] bedside.  

## 2020-03-31 LAB — GC/CHLAMYDIA PROBE AMP (~~LOC~~) NOT AT ARMC
Chlamydia: NEGATIVE
Comment: NEGATIVE
Comment: NORMAL
Neisseria Gonorrhea: NEGATIVE

## 2020-05-05 ENCOUNTER — Ambulatory Visit: Payer: Medicaid Other | Admitting: Obstetrics and Gynecology

## 2020-06-22 ENCOUNTER — Other Ambulatory Visit (HOSPITAL_COMMUNITY)
Admission: RE | Admit: 2020-06-22 | Discharge: 2020-06-22 | Disposition: A | Discharge status: Home / Self Care | Payer: Medicaid Other | Source: Ambulatory Visit | Attending: Obstetrics and Gynecology | Admitting: Obstetrics and Gynecology

## 2020-06-22 ENCOUNTER — Ambulatory Visit (INDEPENDENT_AMBULATORY_CARE_PROVIDER_SITE_OTHER): Payer: PRIVATE HEALTH INSURANCE | Admitting: Obstetrics and Gynecology

## 2020-06-22 ENCOUNTER — Encounter: Payer: Self-pay | Admitting: Family Medicine

## 2020-06-22 ENCOUNTER — Encounter: Payer: Self-pay | Admitting: Obstetrics and Gynecology

## 2020-06-22 ENCOUNTER — Other Ambulatory Visit: Payer: Self-pay

## 2020-06-22 VITALS — BP 159/121 | HR 80 | Wt 185.3 lb

## 2020-06-22 DIAGNOSIS — I10 Essential (primary) hypertension: Secondary | ICD-10-CM

## 2020-06-22 DIAGNOSIS — Z124 Encounter for screening for malignant neoplasm of cervix: Secondary | ICD-10-CM

## 2020-06-22 DIAGNOSIS — N898 Other specified noninflammatory disorders of vagina: Secondary | ICD-10-CM | POA: Insufficient documentation

## 2020-06-22 DIAGNOSIS — N939 Abnormal uterine and vaginal bleeding, unspecified: Secondary | ICD-10-CM

## 2020-06-22 DIAGNOSIS — F32A Depression, unspecified: Secondary | ICD-10-CM

## 2020-06-22 DIAGNOSIS — N809 Endometriosis, unspecified: Secondary | ICD-10-CM | POA: Diagnosis not present

## 2020-06-22 MED ORDER — FLUCONAZOLE 150 MG PO TABS
150.0000 mg | ORAL_TABLET | Freq: Once | ORAL | 1 refills | Status: AC
Start: 2020-06-22 — End: 2020-06-22

## 2020-06-22 MED ORDER — NIFEDIPINE ER OSMOTIC RELEASE 30 MG PO TB24
30.0000 mg | ORAL_TABLET | Freq: Every day | ORAL | 2 refills | Status: DC
Start: 2020-06-22 — End: 2022-01-31

## 2020-06-22 MED ORDER — METRONIDAZOLE 0.75 % VA GEL
1.0000 | Freq: Every day | VAGINAL | 1 refills | Status: DC
Start: 2020-06-22 — End: 2021-02-27

## 2020-06-22 MED ORDER — TRANEXAMIC ACID 650 MG PO TABS
1300.0000 mg | ORAL_TABLET | Freq: Three times a day (TID) | ORAL | 2 refills | Status: DC
Start: 2020-06-22 — End: 2021-02-27

## 2020-06-22 NOTE — Progress Notes (Signed)
GYNECOLOGY OFFICE FOLLOW UP NOTE  History:  33 y.o. G1P0101 here today for follow up for visit to ED for pain s/p diagnostic laparoscopy for suspected ovarian torsion, found to be ruptured hemorrhagic cyst. She is doing much better, states only having pain during her periods. Having pain with periods, heavy bleeding 5-7 days out of the month.   She is getting married soon, her partner wants her to get pregnant. She has significant pain with intercourse, worsening over time. She was told by a prior doctor that she needs to lose weight and take better care of herself to get pregnant, she would like to try for one more year and if no improvement, wants a hysterectomy. She would be happy if she were to get pregnant but she would also be okay if she does not get pregnant. She does not want to deal with long term endometriosis symptoms, hence the trying for one year.  H/o endometriosis. She had diagnostic laparoscopy in 2015 and was diagnosed. Started on lupron. Had significant menopausal symptoms so she stopped it.     Also reports vaginal irritation and some new discharge since starting new soap last week.   Past Medical History:  Diagnosis Date   Asthma    Deliberate self-cutting    Endometriosis    Epistaxis    Hypertension    Kidney stones    Kidney stones     Past Surgical History:  Procedure Laterality Date   CHOLECYSTECTOMY     laproscopy        Current Outpatient Medications:    acetaminophen (TYLENOL) 325 MG tablet, Take 650 mg by mouth every 6 (six) hours as needed for mild pain or headache., Disp: , Rfl:    amoxicillin (AMOXIL) 500 MG tablet, Take 1,000 mg by mouth 2 (two) times daily., Disp: , Rfl:    fluconazole (DIFLUCAN) 150 MG tablet, Take 1 tablet (150 mg total) by mouth once for 1 dose. Can take additional dose three days later if symptoms persist, Disp: 1 tablet, Rfl: 1   metroNIDAZOLE (METROGEL) 0.75 % vaginal gel, Place 1 Applicatorful vaginally at  bedtime. Apply one applicatorful to vagina at bedtime for 5 days, Disp: 70 g, Rfl: 1   NIFEdipine (PROCARDIA-XL/NIFEDICAL-XL) 30 MG 24 hr tablet, Take 1 tablet (30 mg total) by mouth daily. Can increase to twice a day as needed for symptomatic contractions, Disp: 30 tablet, Rfl: 2   tranexamic acid (LYSTEDA) 650 MG TABS tablet, Take 2 tablets (1,300 mg total) by mouth 3 (three) times daily. Take during menses for a maximum of five days, Disp: 30 tablet, Rfl: 2  The following portions of the patient's history were reviewed and updated as appropriate: allergies, current medications, past family history, past medical history, past social history, past surgical history and problem list.   Review of Systems:  Pertinent items noted in HPI and remainder of comprehensive ROS otherwise negative.   Objective:  Physical Exam BP (!) 159/121 (BP Location: Right Arm, Patient Position: Sitting, Cuff Size: Large)    Pulse 80    Wt 185 lb 4.8 oz (84.1 kg)    LMP 06/10/2020 (Exact Date)    BMI 38.07 kg/m  CONSTITUTIONAL: Well-developed, well-nourished female in no acute distress.  HENT:  Normocephalic, atraumatic. External right and left ear normal. Oropharynx is clear and moist EYES: Conjunctivae and EOM are normal. Pupils are equal, round, and reactive to light. No scleral icterus.  NECK: Normal range of motion, supple, no masses SKIN: Skin is  warm and dry. No rash noted. Not diaphoretic. No erythema. No pallor. NEUROLOGIC: Alert and oriented to person, place, and time. Normal reflexes, muscle tone coordination. No cranial nerve deficit noted. PSYCHIATRIC: Normal mood and affect. Normal behavior. Normal judgment and thought content. CARDIOVASCULAR: Normal heart rate noted RESPIRATORY: Effort normal, no problems with respiration noted ABDOMEN: Soft, no distention noted.   PELVIC: deferred MUSCULOSKELETAL: Normal range of motion. No edema noted.  Labs and Imaging No results found.  Assessment & Plan:    1. Endometriosis - Does not want any hormonal management that could interfere with attempting to get pregnancy - reviewed lysteda which will not likely improve pain but may improve bleeding  2. Essential hypertension Will start procardia Referred to PCP Emphasized importance of controlling HTN for long term health and healthy pregnancy  3. Abnormal uterine bleeding (AUB) Start lysteda as above  4. Vaginal irritation metrogel and diflucan sent to pharmacy  5. Vaginal discharge  6. Cervical cancer screening Pap today Pt reports h/o abnormal pap with multiple biopsies, denies LEEP  7. Depression, unspecified depression type - Ambulatory referral to [place]  Routine preventative health maintenance measures emphasized. Please refer to After Visit Summary for other counseling recommendations.   Return in about 6 months (around 12/21/2020).  Total face-to-face time with patient: 35 minutes. Over 50% of encounter was spent on counseling and coordination of care.  Feliz Beam, M.D. Attending Center for [employer])

## 2020-06-23 LAB — CERVICOVAGINAL ANCILLARY ONLY
Bacterial Vaginitis (gardnerella): POSITIVE — AB
Candida Glabrata: NEGATIVE
Candida Vaginitis: NEGATIVE
Chlamydia: NEGATIVE
Comment: NEGATIVE
Comment: NEGATIVE
Comment: NEGATIVE
Comment: NEGATIVE
Comment: NEGATIVE
Comment: NORMAL
Neisseria Gonorrhea: NEGATIVE
Trichomonas: NEGATIVE

## 2020-06-24 LAB — CYTOLOGY - PAP
Comment: NEGATIVE
Diagnosis: NEGATIVE
High risk HPV: NEGATIVE

## 2020-08-08 NOTE — BH Specialist Note (Addendum)
Integrated Behavioral Health via Telemedicine Visit  08/02/2020 KAMIKA Yoder [id number]  Number of [place] visits: 1 Session Start time: 9:46  Session End time: 10:56 Total time: 42  Referring Provider: Vivien Rota, MD Patient/Family location: Home [facility] Provider location: [facility] [place] [facility]  All persons participating in visit: Patient Samantha Yoder and [place]   Types of Service: Individual psychotherapy  I connected with Samantha Yoder and/or Samantha Yoder n/a by Telephone and verified that I am speaking with the correct person using two identifiers.    Discussed confidentiality: Yes   I discussed the limitations of telemedicine and the availability of in person appointments.  Discussed there is a possibility of technology failure and discussed alternative modes of communication if that failure occurs.  I discussed that engaging in this telemedicine visit, they consent to the provision of behavioral healthcare and the services will be billed under their insurance.  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  Pt is writing to cope; would like to start excercising, and open to referral to psychiatry for [facility] medication management. Pt previously on Zololft from 34yo(pregnancy) to 34yo; [age]yo in therapy; no treatment for depression in past 7 years.  Duration of problem: Ongoing; Severity of problem: moderately severe  Patient and/or Family's  Strengths/Protective Factors: Social connections and Sense of purpose  Goals Addressed: Patient will: 1.  Reduce symptoms of: anxiety, depression and stress  2.  Increase knowledge and/or ability of: stress reduction  3.  Demonstrate ability to: Increase healthy adjustment to current life circumstances and Increase motivation to adhere to plan of care  Progress towards Goals: Ongoing  Interventions: Interventions utilized:  Behavioral Activation and Psychoeducation and/or Health Education Standardized Assessments completed: PHQ9/GAD7 in past two weeks  Patient and/or Family Response: Pt agrees to treatment plan  Assessment: Patient currently experiencing Mood disorder, unspecified.   Patient may benefit from psychoeducation and brief therapeutic interventions regarding coping with symptoms of depression, anxiety and life stress .  Plan: 1. Follow up with behavioral health clinician on : Three weeks 2. Behavioral recommendations:  -Accept referral to psychiatry  -Set timer on phone as reminder to use squat machine every morning (prior to work from home) daily -Use squat machine for at least 2 minutes every morning when alarm rings daily 3. Referral(s): Integrated Orthoptist (In Clinic) and [place] (LME/Outside Clinic)  I discussed the assessment and treatment plan with the patient and/or parent/guardian. They were provided an opportunity to ask questions and all were answered. They agreed with the plan and demonstrated an understanding of the instructions.   They were advised to call back or seek an in-person evaluation if the symptoms worsen or if the condition fails to improve as anticipated.  Samantha Yoder   Depression screen [facility] 2/9 06/22/2020  Decreased Interest 3  Down, Depressed, Hopeless 3  PHQ - 2 Score 6  Altered sleeping 3  Tired, decreased energy 3  Change in appetite 2  Feeling bad or failure about yourself  2   Trouble  concentrating 2  Moving slowly or fidgety/restless 0  Suicidal thoughts 0  PHQ-9 Score 18   GAD 7 : Generalized Anxiety Score 06/22/2020  Nervous, Anxious, on Edge 2  Control/stop worrying 3  Worry too much - different things 3  Trouble relaxing 3  Restless 2  Easily annoyed or irritable 3  Afraid - awful might happen 1  Total GAD 7 Score 17

## 2020-08-15 ENCOUNTER — Ambulatory Visit (INDEPENDENT_AMBULATORY_CARE_PROVIDER_SITE_OTHER): Payer: PRIVATE HEALTH INSURANCE | Admitting: Clinical

## 2020-08-15 DIAGNOSIS — F39 Unspecified mood [affective] disorder: Secondary | ICD-10-CM

## 2020-08-15 DIAGNOSIS — F32A Depression, unspecified: Secondary | ICD-10-CM | POA: Diagnosis not present

## 2020-08-15 NOTE — Patient Instructions (Signed)
[facility] [address] [place],  [address] [phone] (main office) [phone] (Jaz Laningham's office)   

## 2020-08-20 IMAGING — CR DG CHEST 2V
2 series · 2 of 2 positions shown · non-contrast
Comparison: 10/01/2017

CLINICAL DATA: Pt to ER for evaluation of 2 days nasal congestion,
cough, and body aches. Reports green/yellow sputum production. Pt's
BP elevated

EXAM:
CHEST - 2 VIEW

[chest pa]
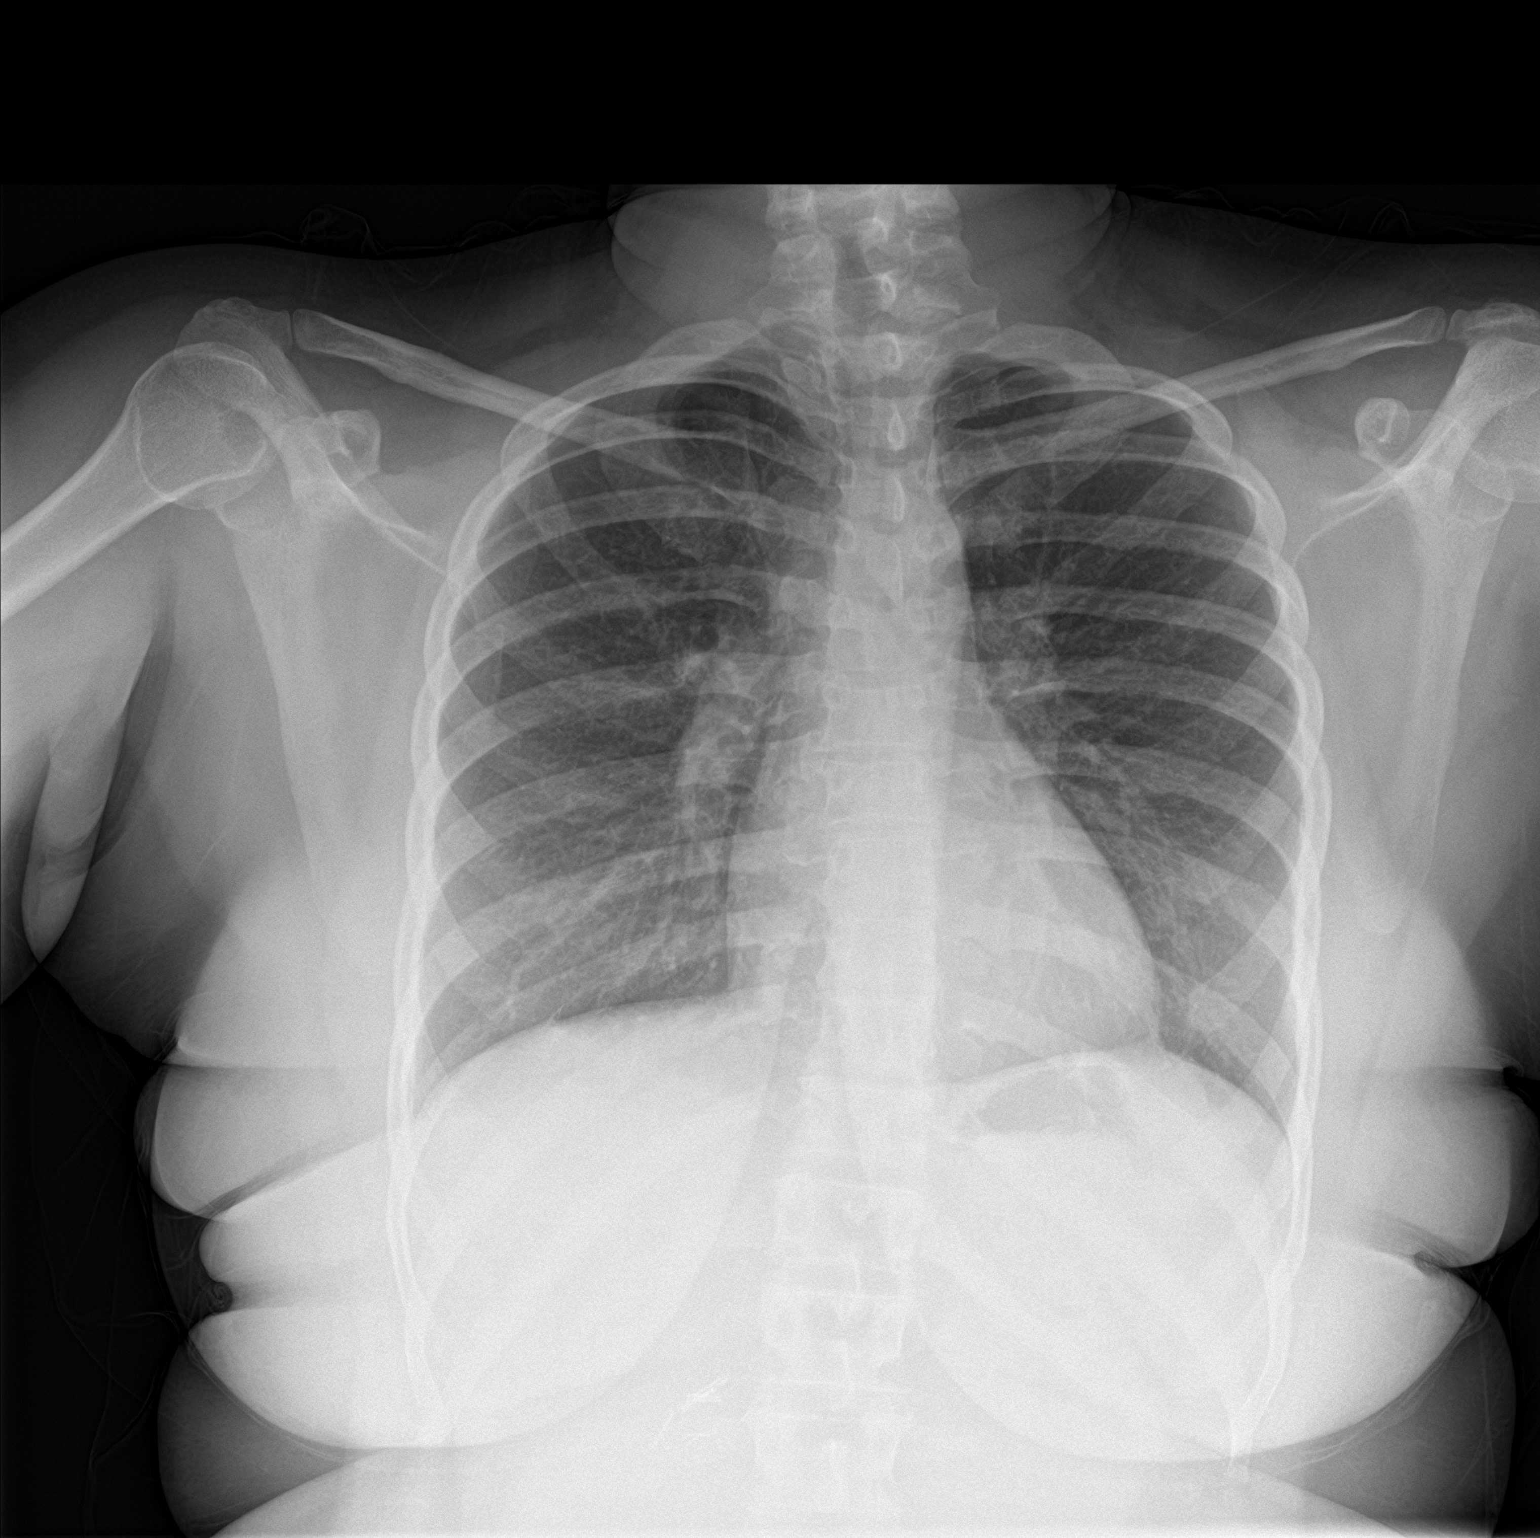

[chest lat]
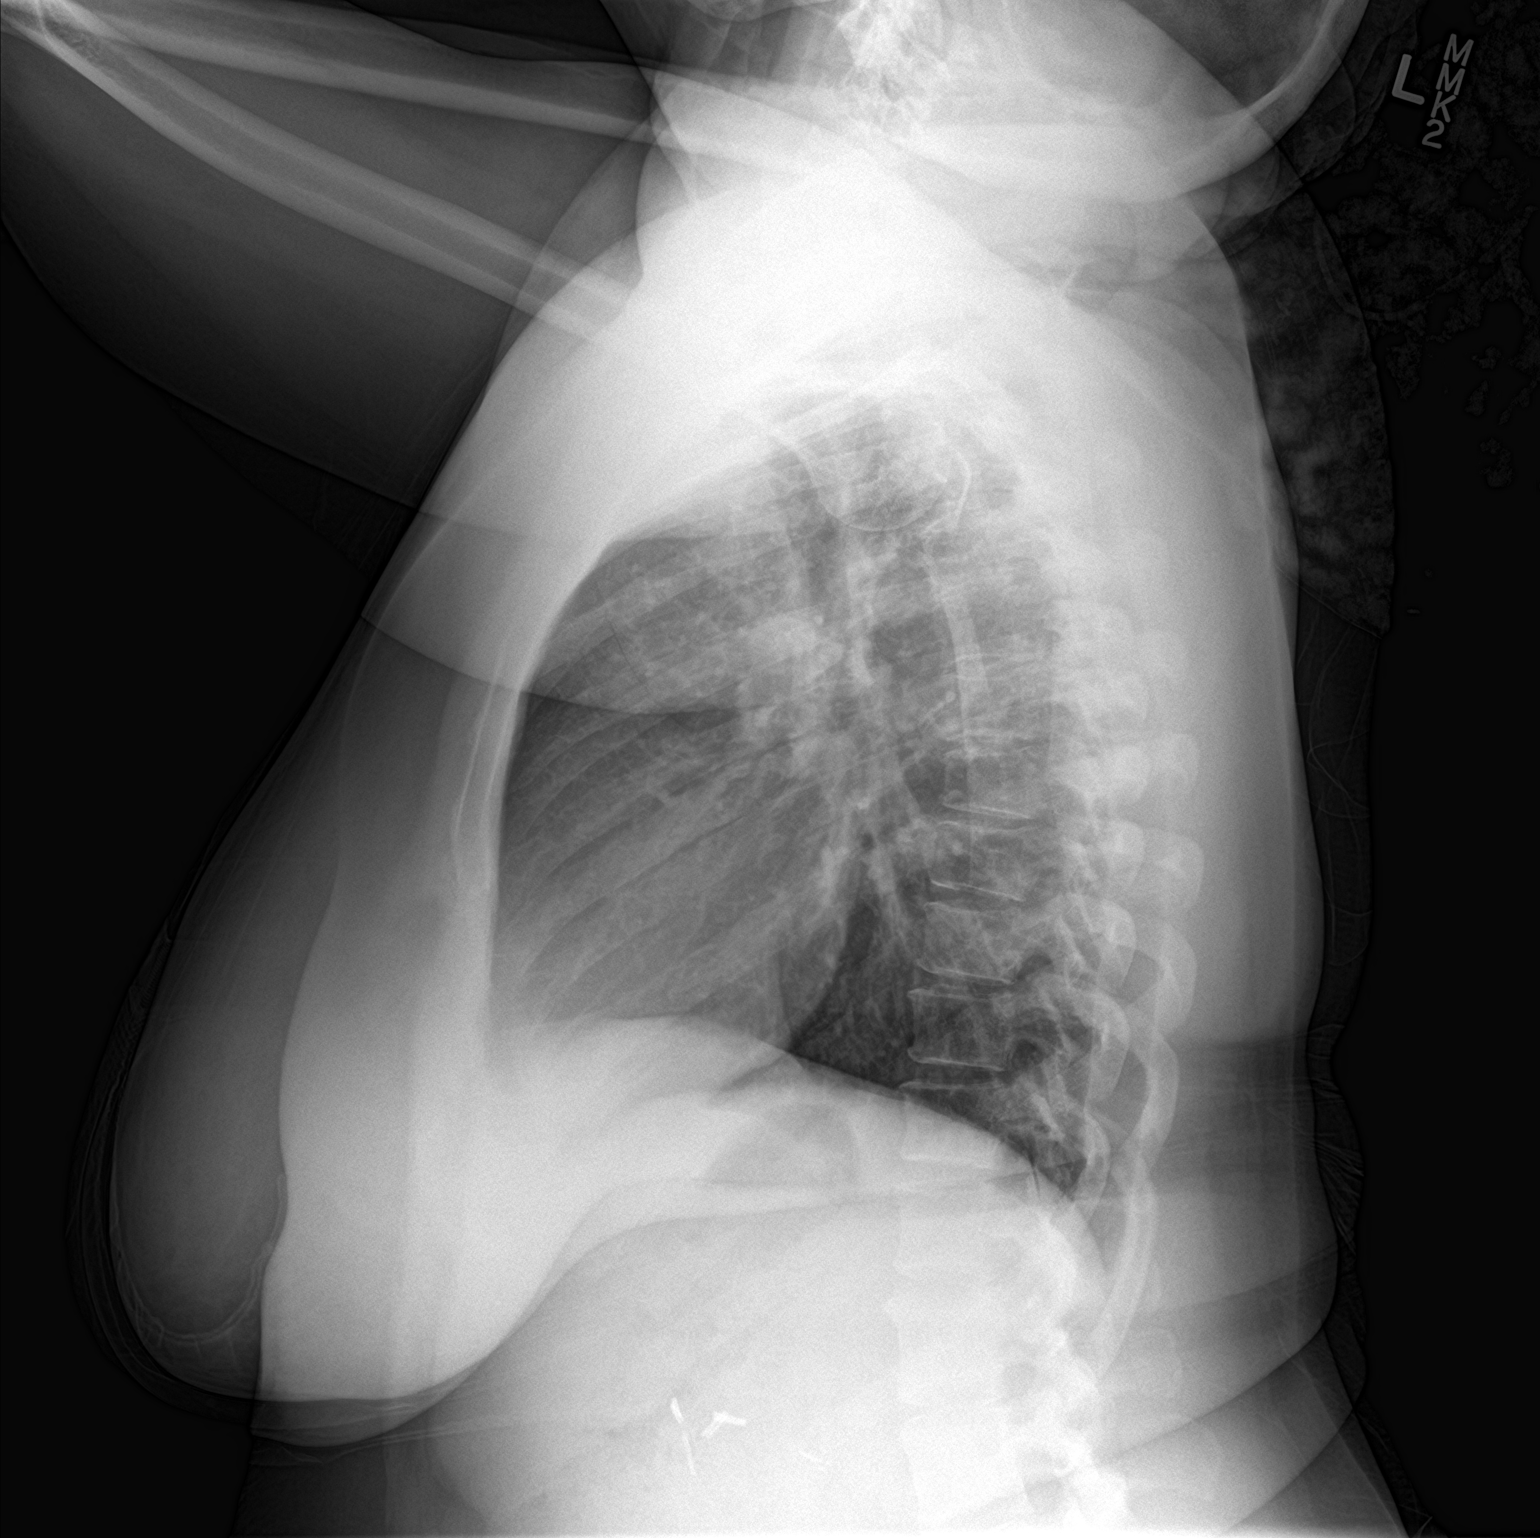

[2 of 2 positions shown; findings below may reference images not displayed]

FINDINGS: Heart size is normal. There is mild prominence of interstitial
markings. No focal consolidations or pleural effusions. No pulmonary
edema. Surgical clips are present in the RIGHT UPPER QUADRANT of the
abdomen.
IMPRESSION: Mildly prominent interstitial markings consistent with viral
pneumonitis.

## 2020-08-23 NOTE — BH Specialist Note (Signed)
Integrated Behavioral Health via Telemedicine Visit  08/23/2020 Samantha Yoder [id number]  Number of [place] visits: 2 Session Start time: 10:38  Session End time: 11:26 Total time: 48  Referring Provider: Vivien Rota, MD Patient/Family location: Home [facility] Provider location: Center for [place] at [facility]  All persons participating in visit: Patient Samantha Yoder and Samantha Yoder   Types of Service: Telephone visit  I connected with Samantha Yoder and/or Samantha Yoder by Telephone  (Video is Caregility application) and verified that I am speaking with the correct person using two identifiers.Discussed confidentiality: Yes   I discussed the limitations of telemedicine and the availability of in person appointments.  Discussed there is a possibility of technology failure and discussed alternative modes of communication if that failure occurs.  I discussed that engaging in this telemedicine visit, they consent to the provision of behavioral healthcare and the services will be billed under their insurance.  Patient and/or legal guardian expressed understanding and consented to Telemedicine visit: Yes   Presenting Concerns: Patient and/or family reports the following symptoms/concerns: Pt states her primary concern today is balancing current life stress (work/relationship with fiance) while productively working towards personal life goals (improving emotional and physical health, job satisfaction, business planning).  Duration of problem: Ongoing; Severity of problem: moderately severe  Patient and/or Family's Strengths/Protective Factors: Social connections and Sense of purpose  Goals Addressed: Patient will: 1.  Reduce symptoms of: anxiety, depression and stress   Progress towards Goals: Ongoing  Interventions: Interventions utilized:  Solution-Focused Strategies Standardized Assessments  completed: Not Needed  Patient and/or Family Response: Pt agrees to revised treatment plan  Assessment: Patient currently experiencing Mood disorder .   Patient may benefit from continued psychoeducation and brief therapeutic interventions regarding coping with symptoms of depression, anxiety and life stress .  Plan: 1. Follow up with behavioral health clinician on : As needed 2. Behavioral recommendations:  -Continue with plan to attend [facility]  appointment on 09/08/2020 -Continue using self-coping strategies that have remained helpful in managing emotions (writing, positive self-talk, apple watch activities etc.) -Set 30 minute appointment with self to prioritize tasks for the following day; continue for as long as remains helpful  3. Referral(s): [place] (In Clinic)  I discussed the assessment and treatment plan with the patient and/or parent/guardian. They were provided an opportunity to ask questions and all were answered. They agreed with the plan and demonstrated an understanding of the instructions.   They were advised to call back or seek an in-person evaluation if the symptoms worsen or if the condition fails to improve as anticipated.  Samantha Hamman Jarae Panas, LCSW   Depression screen [facility] 2/9 06/22/2020  Decreased Interest 3  Down, Depressed, Hopeless 3  PHQ - 2 Score 6  Altered sleeping 3  Tired, decreased energy 3  Change in appetite 2  Feeling bad or failure about yourself  2  Trouble concentrating 2  Moving slowly or fidgety/restless 0  Suicidal thoughts 0  PHQ-9 Score 18   GAD 7 : Generalized Anxiety Score 06/22/2020  Nervous, Anxious, on Edge 2  Control/stop worrying 3  Worry too much - different things 3  Trouble relaxing 3  Restless 2  Easily annoyed or irritable 3  Afraid - awful might happen 1  Total GAD 7 Score 17

## 2020-09-06 ENCOUNTER — Ambulatory Visit (INDEPENDENT_AMBULATORY_CARE_PROVIDER_SITE_OTHER): Payer: Medicaid Other | Admitting: Clinical

## 2020-09-06 DIAGNOSIS — F39 Unspecified mood [affective] disorder: Secondary | ICD-10-CM | POA: Diagnosis not present

## 2020-09-06 NOTE — Patient Instructions (Signed)
[facility] [address] [place], [place] [address] [phone] (main office) [phone] (Ifeanyichukwu Wickham's office)   

## 2020-09-08 ENCOUNTER — Encounter (HOSPITAL_COMMUNITY): Payer: Self-pay

## 2020-09-08 ENCOUNTER — Other Ambulatory Visit: Payer: Self-pay

## 2020-09-08 ENCOUNTER — Ambulatory Visit (HOSPITAL_COMMUNITY): Payer: PRIVATE HEALTH INSURANCE | Admitting: Licensed Clinical Social Worker

## 2020-09-08 ENCOUNTER — Emergency Department (HOSPITAL_COMMUNITY)
Admission: EM | Admit: 2020-09-08 | Discharge: 2020-09-08 | Disposition: A | Discharge status: Home / Self Care | Payer: PRIVATE HEALTH INSURANCE | Attending: Emergency Medicine | Admitting: Emergency Medicine

## 2020-09-08 DIAGNOSIS — R0602 Shortness of breath: Secondary | ICD-10-CM | POA: Diagnosis not present

## 2020-09-08 DIAGNOSIS — Z5321 Procedure and treatment not carried out due to patient leaving prior to being seen by health care provider: Secondary | ICD-10-CM | POA: Diagnosis not present

## 2020-09-08 NOTE — ED Notes (Signed)
Pt eloped from triage room. States this visit was pointless.

## 2020-09-08 NOTE — ED Triage Notes (Signed)
Pt reports shob for a month and a half. Refusing covid test.

## 2020-09-08 NOTE — ED Notes (Signed)
Pt was seen getting in vehicle and leaving

## 2020-09-08 NOTE — ED Notes (Signed)
Pt changed mind and would like to be evaluated.

## 2020-09-15 ENCOUNTER — Encounter (HOSPITAL_COMMUNITY): Payer: Self-pay

## 2020-09-15 ENCOUNTER — Other Ambulatory Visit: Payer: Self-pay

## 2020-09-15 ENCOUNTER — Emergency Department (HOSPITAL_COMMUNITY)
Admission: EM | Admit: 2020-09-15 | Discharge: 2020-09-15 | Disposition: A | Discharge status: Home / Self Care | Payer: PRIVATE HEALTH INSURANCE | Attending: Emergency Medicine | Admitting: Emergency Medicine

## 2020-09-15 DIAGNOSIS — R1010 Upper abdominal pain, unspecified: Secondary | ICD-10-CM | POA: Diagnosis present

## 2020-09-15 DIAGNOSIS — R110 Nausea: Secondary | ICD-10-CM | POA: Diagnosis not present

## 2020-09-15 DIAGNOSIS — Z5321 Procedure and treatment not carried out due to patient leaving prior to being seen by health care provider: Secondary | ICD-10-CM | POA: Diagnosis not present

## 2020-09-15 DIAGNOSIS — U071 COVID-19: Secondary | ICD-10-CM | POA: Diagnosis not present

## 2020-09-15 LAB — COMPREHENSIVE METABOLIC PANEL
ALT: 64 U/L — ABNORMAL HIGH (ref 0–44)
AST: 20 U/L (ref 15–41)
Albumin: 3.9 g/dL (ref 3.5–5.0)
Alkaline Phosphatase: 74 U/L (ref 38–126)
Anion gap: 9 (ref 5–15)
BUN: 13 mg/dL (ref 6–20)
CO2: 26 mmol/L (ref 22–32)
Calcium: 9.1 mg/dL (ref 8.9–10.3)
Chloride: 105 mmol/L (ref 98–111)
Creatinine, Ser: 1.29 mg/dL — ABNORMAL HIGH (ref 0.44–1.00)
GFR, Estimated: 56 mL/min — ABNORMAL LOW (ref >60)
Glucose, Bld: 121 mg/dL — ABNORMAL HIGH (ref 70–99)
Potassium: 3 mmol/L — ABNORMAL LOW (ref 3.5–5.1)
Sodium: 140 mmol/L (ref 135–145)
Total Bilirubin: 0.3 mg/dL (ref 0.3–1.2)
Total Protein: 7.1 g/dL (ref 6.5–8.1)

## 2020-09-15 LAB — URINALYSIS, ROUTINE W REFLEX MICROSCOPIC
Bilirubin Urine: NEGATIVE
Glucose, UA: NEGATIVE mg/dL
Hgb urine dipstick: NEGATIVE
Ketones, ur: 5 mg/dL — AB
Leukocytes,Ua: NEGATIVE
Nitrite: NEGATIVE
Protein, ur: NEGATIVE mg/dL
Specific Gravity, Urine: 1.017 (ref 1.005–1.030)
pH: 6 (ref 5.0–8.0)

## 2020-09-15 LAB — CBC
HCT: 43.5 % (ref 36.0–46.0)
Hemoglobin: 13.9 g/dL (ref 12.0–15.0)
MCH: 27.1 pg (ref 26.0–34.0)
MCHC: 32 g/dL (ref 30.0–36.0)
MCV: 84.8 fL (ref 80.0–100.0)
Platelets: 329 10*3/uL (ref 150–400)
RBC: 5.13 MIL/uL — ABNORMAL HIGH (ref 3.87–5.11)
RDW: 15.2 % (ref 11.5–15.5)
WBC: 11.1 10*3/uL — ABNORMAL HIGH (ref 4.0–10.5)
nRBC: 0 % (ref 0.0–0.2)

## 2020-09-15 LAB — I-STAT BETA HCG BLOOD, ED (MC, WL, AP ONLY): I-stat hCG, quantitative: <5 m[IU]/mL (ref <5)

## 2020-09-15 LAB — LIPASE, BLOOD: Lipase: 45 U/L (ref 11–51)

## 2020-09-15 NOTE — ED Triage Notes (Signed)
Patient arrived via [facility] with complaints of upper abdominal pain and nausea. Patient is also covid-19 positive

## 2020-10-28 ENCOUNTER — Telehealth (HOSPITAL_COMMUNITY): Payer: Self-pay | Admitting: Licensed Clinical Social Worker

## 2020-10-28 ENCOUNTER — Ambulatory Visit (HOSPITAL_COMMUNITY): Payer: PRIVATE HEALTH INSURANCE | Admitting: Licensed Clinical Social Worker

## 2020-10-28 ENCOUNTER — Other Ambulatory Visit: Payer: Self-pay

## 2020-10-28 NOTE — Telephone Encounter (Signed)
LCSW sent two links to pt phone. Pt enter video chat on 2nd link. Pt could be seen but not heard well. Pt requested a call. LCSW called x 4 off office phone each time going to a busy signal, then had front desk try to make sure it was not LCSW phone but still went to busy signal. LCSW sent another link for video chat but pt did not enter video conference this time. Pt will not be marked as a no show due to technical difficulties.

## 2021-02-25 NOTE — Progress Notes (Signed)
Provider was unable to successfully connect with patient. Called at 2:27 pm and 2:34 pm and left voicemail each time without response.

## 2021-02-27 ENCOUNTER — Encounter: Payer: Self-pay | Admitting: Family

## 2021-02-27 ENCOUNTER — Encounter: Payer: PRIVATE HEALTH INSURANCE | Admitting: Family

## 2021-02-27 ENCOUNTER — Other Ambulatory Visit: Payer: Self-pay

## 2021-02-27 NOTE — Progress Notes (Signed)
Pt presents for telemedicine visit to establish care  Pt has been dealing with depression since age of 21, was with a behavioral health specialist but provider never followed up with pt

## 2021-06-01 ENCOUNTER — Other Ambulatory Visit: Payer: Self-pay | Admitting: *Deleted

## 2021-06-01 ENCOUNTER — Telehealth: Payer: PRIVATE HEALTH INSURANCE | Admitting: Nurse Practitioner

## 2021-06-01 ENCOUNTER — Telehealth: Payer: Self-pay

## 2021-06-01 DIAGNOSIS — N760 Acute vaginitis: Secondary | ICD-10-CM

## 2021-06-01 MED ORDER — FLUCONAZOLE 150 MG PO TABS
150.0000 mg | ORAL_TABLET | Freq: Once | ORAL | 0 refills | Status: AC
Start: 2021-06-01 — End: 2021-06-01

## 2021-06-01 MED ORDER — METRONIDAZOLE 0.75 % VA GEL: 1.0000 | Freq: Every day | VAGINAL | 0 refills | Status: AC

## 2021-06-01 NOTE — Telephone Encounter (Signed)
Called pt and discussed her concern. She reports having had BV in the past and feels she is developing sx consistent with this condition. Pt currently has small amount of d/c but no odor. Metrogel Rx sent to pharmacy requested. Fluconazole Rx also sent as pt reports she gets vaginal yeast after Metrogel. Pt was advised to take Fluconazole after completion of Metrogel and only of yeast sx are present. She voiced understanding of all information and instructions given.

## 2021-06-01 NOTE — Telephone Encounter (Signed)
Patient called in requesting if we can send in this Rx to pharmacy she was in [place] and got into a pool and nothing in her private area is doing right. States Dr. Shanon Brow always sends this and it helps. She also says the gel gives are yeast infection if we could send in something for yeast also    metroNIDAZOLE (METROGEL) 0.75 % vaginal gel [[id number]   [id number] - [place], [address] AT [address]  [place], [place] [address]  Phone:  [phone]  Fax:  [phone]

## 2021-06-01 NOTE — Progress Notes (Signed)
Patient did not respond to [employer]. I then placed a telephone call to the number listed.  She states she already had E visit with OB GYN for this issue. Does not wish to proceed with evisit

## 2021-07-18 ENCOUNTER — Ambulatory Visit: Payer: PRIVATE HEALTH INSURANCE | Admitting: Family Medicine

## 2021-08-21 ENCOUNTER — Telehealth: Payer: PRIVATE HEALTH INSURANCE | Admitting: Nurse Practitioner

## 2021-08-21 DIAGNOSIS — B379 Candidiasis, unspecified: Secondary | ICD-10-CM

## 2021-08-21 MED ORDER — FLUCONAZOLE 150 MG PO TABS: 150.0000 mg | ORAL_TABLET | Freq: Once | ORAL | 0 refills | Status: AC

## 2021-08-21 NOTE — Progress Notes (Signed)
E-Visit for Vaginal Symptoms  We are sorry that you are not feeling well. Here is how we plan to help! Based on what you shared with me it looks like you: May have a yeast vaginosis  Vaginosis is an inflammation of the vagina that can result in discharge, itching and pain. The cause is usually a change in the normal balance of vaginal bacteria or an infection. Vaginosis can also result from reduced estrogen levels after menopause.  The most common causes of vaginosis are:   Bacterial vaginosis which results from an overgrowth of one on several organisms that are normally present in your vagina.   Yeast infections which are caused by a naturally occurring fungus called candida.   Vaginal atrophy (atrophic vaginosis) which results from the thinning of the vagina from reduced estrogen levels after menopause.   Trichomoniasis which is caused by a parasite and is commonly transmitted by sexual intercourse.  Factors that increase your risk of developing vaginosis include: Medications, such as antibiotics and steroids Uncontrolled diabetes Use of hygiene products such as bubble bath, vaginal spray or vaginal deodorant Douching Wearing damp or tight-fitting clothing Using an intrauterine device (IUD) for birth control Hormonal changes, such as those associated with pregnancy, birth control pills or menopause Sexual activity Having a sexually transmitted infection  Your treatment plan is A single Diflucan (fluconazole) 150mg tablet once.  I have electronically sent this prescription into the pharmacy that you have chosen.  Be sure to take all of the medication as directed. Stop taking any medication if you develop a rash, tongue swelling or shortness of breath. Mothers who are breast feeding should consider pumping and discarding their breast milk while on these antibiotics. However, there is no consensus that infant exposure at these doses would be harmful.  Remember that medication creams can  weaken latex condoms. .   HOME CARE:  Good hygiene may prevent some types of vaginosis from recurring and may relieve some symptoms:  Avoid baths, hot tubs and whirlpool spas. Rinse soap from your outer genital area after a shower, and dry the area well to prevent irritation. Don't use scented or harsh soaps, such as those with deodorant or antibacterial action. Avoid irritants. These include scented tampons and pads. Wipe from front to back after using the toilet. Doing so avoids spreading fecal bacteria to your vagina.  Other things that may help prevent vaginosis include:  Don't douche. Your vagina doesn't require cleansing other than normal bathing. Repetitive douching disrupts the normal organisms that reside in the vagina and can actually increase your risk of vaginal infection. Douching won't clear up a vaginal infection. Use a latex condom. Both female and female latex condoms may help you avoid infections spread by sexual contact. Wear cotton underwear. Also wear pantyhose with a cotton crotch. If you feel comfortable without it, skip wearing underwear to bed. Yeast thrives in moist environments Your symptoms should improve in the next day or two.  GET HELP RIGHT AWAY IF:  You have pain in your lower abdomen ( pelvic area or over your ovaries) You develop nausea or vomiting You develop a fever Your discharge changes or worsens You have persistent pain with intercourse You develop shortness of breath, a rapid pulse, or you faint.  These symptoms could be signs of problems or infections that need to be evaluated by a medical provider now.  MAKE SURE YOU   Understand these instructions. Will watch your condition. Will get help right away if you are not   doing well or get worse.  Thank you for choosing an e-visit.  Your e-visit answers were reviewed by a board certified advanced clinical practitioner to complete your personal care plan. Depending upon the condition, your plan  could have included both over the counter or prescription medications.  Please review your pharmacy choice. Make sure the pharmacy is open so you can pick up prescription now. If there is a problem, you may contact your provider through [employer] and have the prescription routed to another pharmacy.  Your safety is important to [place]. If you have drug allergies check your prescription carefully.   For the next 24 hours you can use MyChart to ask questions about today's visit, request a non-urgent call back, or ask for a work or school excuse. You will get an email in the next two days asking about your experience. I hope that your e-visit has been valuable and will speed your recovery.   I spent approximately 7 minutes reviewing the patient's history, current symptoms and coordinating their plan of care today.    Meds ordered this encounter  Medications   fluconazole (DIFLUCAN) 150 MG tablet    Sig: Take 1 tablet (150 mg total) by mouth once for 1 dose.    Dispense:  1 tablet    Refill:  0

## 2022-01-31 ENCOUNTER — Emergency Department (HOSPITAL_BASED_OUTPATIENT_CLINIC_OR_DEPARTMENT_OTHER)
Admission: EM | Admit: 2022-01-31 | Discharge: 2022-01-31 | Disposition: A | Discharge status: Home / Self Care | Payer: Self-pay | Attending: Emergency Medicine | Admitting: Emergency Medicine

## 2022-01-31 ENCOUNTER — Other Ambulatory Visit: Payer: Self-pay

## 2022-01-31 ENCOUNTER — Emergency Department (HOSPITAL_BASED_OUTPATIENT_CLINIC_OR_DEPARTMENT_OTHER): Payer: Self-pay

## 2022-01-31 ENCOUNTER — Encounter (HOSPITAL_BASED_OUTPATIENT_CLINIC_OR_DEPARTMENT_OTHER): Payer: Self-pay

## 2022-01-31 DIAGNOSIS — I160 Hypertensive urgency: Secondary | ICD-10-CM | POA: Insufficient documentation

## 2022-01-31 DIAGNOSIS — J45909 Unspecified asthma, uncomplicated: Secondary | ICD-10-CM | POA: Insufficient documentation

## 2022-01-31 DIAGNOSIS — I10 Essential (primary) hypertension: Secondary | ICD-10-CM | POA: Insufficient documentation

## 2022-01-31 DIAGNOSIS — R1084 Generalized abdominal pain: Secondary | ICD-10-CM | POA: Insufficient documentation

## 2022-01-31 DIAGNOSIS — Z79899 Other long term (current) drug therapy: Secondary | ICD-10-CM | POA: Insufficient documentation

## 2022-01-31 LAB — CBC WITH DIFFERENTIAL/PLATELET
Abs Immature Granulocytes: 0.01 10*3/uL (ref 0.00–0.07)
Basophils Absolute: 0 10*3/uL (ref 0.0–0.1)
Basophils Relative: 1 %
Eosinophils Absolute: 0.1 10*3/uL (ref 0.0–0.5)
Eosinophils Relative: 2 %
HCT: 41.3 % (ref 36.0–46.0)
Hemoglobin: 13.5 g/dL (ref 12.0–15.0)
Immature Granulocytes: 0 %
Lymphocytes Relative: 36 %
Lymphs Abs: 2.2 10*3/uL (ref 0.7–4.0)
MCH: 28.3 pg (ref 26.0–34.0)
MCHC: 32.7 g/dL (ref 30.0–36.0)
MCV: 86.6 fL (ref 80.0–100.0)
Monocytes Absolute: 0.4 10*3/uL (ref 0.1–1.0)
Monocytes Relative: 7 %
Neutro Abs: 3.2 10*3/uL (ref 1.7–7.7)
Neutrophils Relative %: 54 %
Platelets: 294 10*3/uL (ref 150–400)
RBC: 4.77 MIL/uL (ref 3.87–5.11)
RDW: 15 % (ref 11.5–15.5)
WBC: 5.9 10*3/uL (ref 4.0–10.5)
nRBC: 0 % (ref 0.0–0.2)

## 2022-01-31 LAB — COMPREHENSIVE METABOLIC PANEL
ALT: 33 U/L (ref 0–44)
AST: 23 U/L (ref 15–41)
Albumin: 4.1 g/dL (ref 3.5–5.0)
Alkaline Phosphatase: 73 U/L (ref 38–126)
Anion gap: 9 (ref 5–15)
BUN: 11 mg/dL (ref 6–20)
CO2: 26 mmol/L (ref 22–32)
Calcium: 9.1 mg/dL (ref 8.9–10.3)
Chloride: 104 mmol/L (ref 98–111)
Creatinine, Ser: 1.1 mg/dL — ABNORMAL HIGH (ref 0.44–1.00)
GFR, Estimated: >60 mL/min (ref >60)
Glucose, Bld: 87 mg/dL (ref 70–99)
Potassium: 3.4 mmol/L — ABNORMAL LOW (ref 3.5–5.1)
Sodium: 139 mmol/L (ref 135–145)
Total Bilirubin: 0.6 mg/dL (ref 0.3–1.2)
Total Protein: 7.1 g/dL (ref 6.5–8.1)

## 2022-01-31 LAB — URINALYSIS, ROUTINE W REFLEX MICROSCOPIC
Bilirubin Urine: NEGATIVE
Glucose, UA: NEGATIVE mg/dL
Ketones, ur: NEGATIVE mg/dL
Leukocytes,Ua: NEGATIVE
Nitrite: NEGATIVE
Specific Gravity, Urine: 1.022 (ref 1.005–1.030)
pH: 6.5 (ref 5.0–8.0)

## 2022-01-31 LAB — WET PREP, GENITAL
Clue Cells Wet Prep HPF POC: NONE SEEN
Sperm: NONE SEEN
Trich, Wet Prep: NONE SEEN
WBC, Wet Prep HPF POC: <10 (ref <10)
Yeast Wet Prep HPF POC: NONE SEEN

## 2022-01-31 LAB — LIPASE, BLOOD: Lipase: 19 U/L (ref 11–51)

## 2022-01-31 LAB — PREGNANCY, URINE: Preg Test, Ur: NEGATIVE

## 2022-01-31 MED ORDER — AMLODIPINE BESYLATE 5 MG PO TABS
10.0000 mg | ORAL_TABLET | Freq: Once | ORAL | Status: AC
  Administered 2022-01-31: 10 mg via ORAL
  Filled 2022-01-31: qty 2

## 2022-01-31 MED ORDER — IOHEXOL 300 MG/ML  SOLN
100.0000 mL | Freq: Once | INTRAMUSCULAR | Status: AC | PRN
Start: 2022-01-31 — End: 2022-01-31
  Administered 2022-01-31: 80 mL via INTRAVENOUS

## 2022-01-31 MED ORDER — FENTANYL CITRATE PF 50 MCG/ML IJ SOSY
50.0000 ug | PREFILLED_SYRINGE | Freq: Once | INTRAMUSCULAR | Status: AC
  Administered 2022-01-31: 50 ug via INTRAVENOUS
  Filled 2022-01-31: qty 1

## 2022-01-31 MED ORDER — AMLODIPINE BESYLATE 5 MG PO TABS: 10.0000 mg | ORAL_TABLET | Freq: Every day | ORAL | 2 refills | Status: DC

## 2022-01-31 NOTE — ED Notes (Signed)
Pt complains of a slight headache that just started; she said she thought it was due to hunger.

## 2022-01-31 NOTE — ED Provider Notes (Signed)
[facility] DE[facility] Provider Note   CSN: [id number] Arrival date & time: 01/31/22  1400     History  Chief Complaint  Patient presents with   Abdominal Pain    Samantha Yoder is a 36 y.o. female.   Abdominal Pain  Patient with medical history of endometriosis, hypertension not on meds, depression, asthma presents today due to abdominal pain.  Patient states she has been having epigastric abdominal pain for about a week, states she noticed a new "lump" around her abdomen 2 months before that which has been painful until a week ago.  States yesterday she started having lower abdominal pain which is worse with urination.  She is also having vaginal spotting, this is normal for her due to her history of endometriosis per patient.  States she had 1 episode of vomiting last week but has not since then.  Denies any new sexual partners, she was worried that she may be developing BV so used an expired gel last night which worsened her symptoms this morning.  The pain is mostly to the right lower quadrant, the epigastric area and to the right flank.  It is constant but the severity waxes and wanes.  Status post cholecystectomy, laparoscopy (diagnostic)  Of note, patient is hypertensive and states that she knows and she is always hypertensive but does not take the medicine because it "makes her pee too much".  Home Medications Prior to Admission medications   Medication Sig Start Date End Date Taking? Authorizing Provider  amLODipine (NORVASC) 5 MG tablet Take 2 tablets (10 mg total) by mouth daily. 01/31/22  Yes Sherrill Raring, PA-C  acetaminophen (TYLENOL) 325 MG tablet Take 650 mg by mouth every 6 (six) hours as needed for mild pain or headache.    [provider]  albuterol (VENTOLIN HFA) 108 (90 Base) MCG/ACT inhaler albuterol sulfate HFA 90 mcg/actuation aerosol inhaler    [provider]      Allergies    Other    Review of Systems   Review of  Systems  Gastrointestinal:  Positive for abdominal pain.   Physical Exam Updated Vital Signs BP (!) 187/119 (BP Location: Right Arm)   Pulse (!) 59   Temp 98.2 F (36.8 C) (Oral)   Resp 16   Ht '4\' 10"'$  (1.473 m)   Wt 83.5 kg   SpO2 98%   BMI 38.47 kg/m  Physical Exam Vitals and nursing note reviewed. Exam conducted with a chaperone present.  Constitutional:      Appearance: Normal appearance. She is diaphoretic.     Comments: BMI 38  HENT:     Head: Normocephalic and atraumatic.  Eyes:     General: No scleral icterus.       Right eye: No discharge.        Left eye: No discharge.     Extraocular Movements: Extraocular movements intact.     Pupils: Pupils are equal, round, and reactive to light.  Cardiovascular:     Rate and Rhythm: Normal rate and regular rhythm.     Pulses: Normal pulses.     Heart sounds: Normal heart sounds. No murmur heard.   No friction rub. No gallop.  Pulmonary:     Effort: Pulmonary effort is normal. No respiratory distress.     Breath sounds: Normal breath sounds.  Abdominal:     General: Abdomen is flat. Bowel sounds are normal. There is no distension.     Palpations: Abdomen is soft.  Tenderness: There is abdominal tenderness in the right lower quadrant, epigastric area, periumbilical area and suprapubic area. There is right CVA tenderness. There is no left CVA tenderness.     Hernia: A hernia is present. Hernia is present in the umbilical area.  Genitourinary:    Vagina: Bleeding present.     Cervix: Discharge present. No cervical motion tenderness.     Uterus: Normal.      Adnexa: Right adnexa normal and left adnexa normal.       Right: No tenderness.         Left: No tenderness.    Skin:    General: Skin is warm.     Coloration: Skin is not jaundiced.  Neurological:     Mental Status: She is alert. Mental status is at baseline.     Coordination: Coordination normal.    ED Results / Procedures / Treatments   Labs (all labs  ordered are listed, but only abnormal results are displayed) Labs Reviewed  URINALYSIS, ROUTINE W REFLEX MICROSCOPIC - Abnormal; Notable for the following components:      Result Value   Hgb urine dipstick LARGE (*)    Protein, ur TRACE (*)    All other components within normal limits  COMPREHENSIVE METABOLIC PANEL - Abnormal; Notable for the following components:   Potassium 3.4 (*)    Creatinine, Ser 1.10 (*)    All other components within normal limits  WET PREP, GENITAL  PREGNANCY, URINE  CBC WITH DIFFERENTIAL/PLATELET  LIPASE, BLOOD  GC/CHLAMYDIA PROBE AMP ([place]) NOT AT [facility]    EKG None  Radiology CT Abdomen Pelvis W Contrast  Result Date: 01/31/2022 CLINICAL DATA:  Abdominal pain, acute, nonlocalized. Spotting. Pain with urination. History of kidney stones. EXAM: CT ABDOMEN AND PELVIS WITH CONTRAST TECHNIQUE: Multidetector CT imaging of the abdomen and pelvis was performed using the standard protocol following bolus administration of intravenous contrast. RADIATION DOSE REDUCTION: This exam was performed according to the departmental dose-optimization program which includes automated exposure control, adjustment of the mA and/or kV according to patient size and/or use of iterative reconstruction technique. CONTRAST:  41m OMNIPAQUE IOHEXOL 300 MG/ML  SOLN COMPARISON:  03/30/2020 FINDINGS: Lower chest: Normal Hepatobiliary: Liver parenchyma is normal. Previous cholecystectomy. Pancreas: Normal Spleen: Normal Adrenals/Urinary Tract: Adrenal glands are normal. Kidneys are normal. Bladder is normal. Stomach/Bowel: Stomach and small intestine are normal. Normal appendix. No abnormal colon finding. 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 Slight enlargement of a right adnexal cyst, now measuring up to 4.9 cm in size. Slight enlargement of the left adnexal cystic lesion, now measuring 9.7 x 6.4 by 7.0 cm. Medial wall nodule previously  seen is partially obscured by streak artifact. Other: No free fluid or air. Musculoskeletal: Chronic post traumatic findings of the pelvis as seen previously. IMPRESSION: Previous cholecystectomy. Ventral hernias containing only fat, the largest in the periumbilical region, increased in size since 2021. 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 Bilateral cystic adnexal lesions. Slight enlargement on the right, now measuring up to 4.9 cm in size. Further enlargement on the left measuring 9.7 x 6.4 x 7.0 cm. The differential diagnosis is endometriomas versus benign cysts versus cystic neoplasms. Follow-up by [facility] recommended in 3-6 months. Note: This recommendation does not apply to premenarchal patients and to those with increased risk (genetic, family history, elevated tumor markers or other high-risk factors) of ovarian cancer. Reference: JACR 2020 Feb; 17(2):248-254 Electronically Signed  By: Nelson Chimes M.D.   On: 01/31/2022 16:06    Procedures Procedures    Medications Ordered in ED Medications  fentaNYL (SUBLIMAZE) injection 50 mcg (50 mcg Intravenous Given 01/31/22 1450)  amLODipine (NORVASC) tablet 10 mg (10 mg Oral Given 01/31/22 1523)  iohexol (OMNIPAQUE) 300 MG/ML solution 100 mL (80 mLs Intravenous Contrast Given 01/31/22 1542)    ED Course/ Medical Decision Making/ A&P                           Medical Decision Making Amount and/or Complexity of Data Reviewed Labs: ordered. Radiology: ordered.  Risk Prescription drug management.   This patient presents to the ED for concern of abdominal pain, this involves an extensive number of treatment options, and is a complaint that carries with it a high risk of complications and morbidity.  The differential diagnosis includes UTI, Pilo, pelvic inflammatory disease, hypertensive emergency, hypertensive urgency, incarcerated hernia, strangulated hernia, SBO, colitis, nephrolithiasis, ectopic pregnancy ruptured.  Patient's  presentation is complicated by their history of endometriosis and hypertension with medical noncompliance.    On exam patient is diffusely tender with guarding especially in the umbilical area.  She does have an umbilical hernia which is soft and easily reproducible.  There is no specific adnexal tenderness, no significant amount of discharge or cervical wall motion tenderness on pelvic exam.  Patient is markedly hypertensive, I ordered her 10 mg of Norvasc given this was her preferred home medicine.  3:24 PM  - BP (!) 202/141   Pulse 92   Temp 97.8 F (36.6 C) (Temporal)   Resp 20   Ht '4\' 10"'$  (1.473 m)   Wt 83.5 kg   SpO2 100%   BMI 38.47 kg/m    Additional history obtained:   Reviewed external records, patient was on amlodipine previously.  She will switch to Lasix which she does not take because makes her pee.She also has a history of hemorrhagic cyst previously as well as kidney stones.    Lab Tests:  I ordered, viewed, and personally interpreted labs.  The pertinent results include: UA is with hematuria but no signs of UTI.  Patient is not pregnant.  There is no gross electrolyte derangement or AKI-slight elevation of creatinine at 1.10 but no significant renal insufficiency., no leukocytosis or anemia.  GC chlamydia pending but wet prep is negative.    Imaging Studies ordered:  I directly visualized the CT abdomen pelvis, which showed bilateral adnexal cysts.  I agree with the radiologist interpretation    ECG/Cardiac monitoring:    The patient was maintained on a cardiac monitor.  Visualized monitor strip which showed NSR per my interpretation.    Medicines ordered and prescription drug management:  I ordered medication including: Fentanyl, amlodipine  I have reviewed the patients home medicines and have made adjustments as needed   Test Considered:  Considered ultrasound but given there is no adnexal tenderness or CVA tenderness do not feel indicated at this  time.  Discussed options for workup with patient, risks and benefits, via shared decision making decided to forgo pelvic ultrasound test at this tim   Reevaluation:  After the interventions noted above, I reevaluated the patient and found patient is feeling improved.  Blood pressure improved after amlodipine.   Problems addressed / ED Course: High blood pressure-patient is medically noncompliant.  Amlodipine ordered and refill sent to her pharmacy.  There is some trace proteinuria and slight elevation of creatinine  but no signs of AKI or acute renal impairment.  Patient is not having any chest pain or shortness of breath, doubt ACS.  No headache or neurologic complaints so I do not think CT head indicated.  No signs of hypertensive emergency, discussed risks of uncontrolled hypertension including stroke, heart attack, incapacitation and renal failure. Abdominal pain-CT scan reassuring, she has bilateral cyst in the known contacts of endometriosis.  She has gynecology follow-up available outpatient, she is calling them to schedule an appointment next week for reevaluation.  Her pain is improved throughout the ED, we discussed return precautions and at this point I feel patient is appropriate for outpatient follow-up.   Social Determinants of Health:    Disposition:   After consideration of the diagnostic results and the patients response to treatment, I feel that the patent would benefit from outpatient F/U.          Final Clinical Impression(s) / ED Diagnoses Final diagnoses:  Hypertensive urgency  Generalized abdominal pain    Rx / DC Orders ED Discharge Orders          Ordered    amLODipine (NORVASC) 5 MG tablet  Daily        01/31/22 1642              Sherrill Raring, PA-C 01/31/22 1653    Luna Fuse, MD 02/10/22 1656

## 2022-01-31 NOTE — ED Triage Notes (Signed)
Patient here POV from Home.  Endorses waking up yesterday AM with Spotting and Patient thought she had BV and took Metrogel last PM.  Awoke this AM with Severe ABD Pain and realized Medication was expired.   Associated with Pain with Urination today. No N/V/D.  In Obvious Discomfort during Triage. A&Ox4. GCS 15. Ambulatory.

## 2022-01-31 NOTE — Discharge Instructions (Signed)
Your work-up today was reassuring.  Follow-up with your gynecologist in the next week for reevaluation, call tomorrow to set up an appointment. Take the amlodipine 10 mg daily.  Follow-up with your primary for reevaluation.

## 2022-01-31 NOTE — ED Notes (Signed)
Pt denis headache

## 2022-02-01 LAB — GC/CHLAMYDIA PROBE AMP (~~LOC~~) NOT AT ARMC
Chlamydia: NEGATIVE
Comment: NEGATIVE
Comment: NORMAL
Neisseria Gonorrhea: NEGATIVE

## 2022-03-01 ENCOUNTER — Telehealth: Payer: Medicaid Other | Admitting: Physician Assistant

## 2022-03-01 DIAGNOSIS — B3731 Acute candidiasis of vulva and vagina: Secondary | ICD-10-CM

## 2022-03-01 MED ORDER — FLUCONAZOLE 150 MG PO TABS: 150.0000 mg | ORAL_TABLET | Freq: Once | ORAL | 0 refills | Status: AC

## 2022-03-01 NOTE — Progress Notes (Signed)
I have spent 5 minutes in review of e-visit questionnaire, review and updating patient chart, medical decision making and response to patient.   Ramzy Cappelletti Cody Toshika Parrow, PA-C    

## 2022-03-01 NOTE — Progress Notes (Signed)

## 2022-03-22 ENCOUNTER — Ambulatory Visit (HOSPITAL_COMMUNITY)
Admission: EM | Admit: 2022-03-22 | Discharge: 2022-03-22 | Disposition: A | Discharge status: Home / Self Care | Payer: Medicaid Other | Attending: Student | Admitting: Student

## 2022-03-22 ENCOUNTER — Encounter (HOSPITAL_COMMUNITY): Payer: Self-pay

## 2022-03-22 DIAGNOSIS — N760 Acute vaginitis: Secondary | ICD-10-CM | POA: Insufficient documentation

## 2022-03-22 DIAGNOSIS — Z113 Encounter for screening for infections with a predominantly sexual mode of transmission: Secondary | ICD-10-CM | POA: Insufficient documentation

## 2022-03-22 LAB — POCT URINALYSIS DIPSTICK, ED / UC
Glucose, UA: NEGATIVE mg/dL
Nitrite: NEGATIVE
Protein, ur: 30 mg/dL — AB
Specific Gravity, Urine: 1.025 (ref 1.005–1.030)
Urobilinogen, UA: 0.2 mg/dL (ref 0.0–1.0)
pH: 6.5 (ref 5.0–8.0)

## 2022-03-22 LAB — POC URINE PREG, ED: Preg Test, Ur: NEGATIVE

## 2022-03-22 MED ORDER — METRONIDAZOLE 0.75 % VA GEL: 1.0000 | Freq: Every day | VAGINAL | 0 refills | Status: DC

## 2022-03-22 MED ORDER — CEFTRIAXONE SODIUM 500 MG IJ SOLR
INTRAMUSCULAR | Status: AC
  Filled 2022-03-22: qty 500

## 2022-03-22 MED ORDER — LIDOCAINE HCL (PF) 1 % IJ SOLN
INTRAMUSCULAR | Status: AC
  Filled 2022-03-22: qty 2

## 2022-03-22 MED ORDER — CEFTRIAXONE SODIUM 500 MG IJ SOLR
500.0000 mg | Freq: Once | INTRAMUSCULAR | Status: AC
  Administered 2022-03-22: 500 mg via INTRAMUSCULAR

## 2022-03-22 MED ORDER — FLUCONAZOLE 150 MG PO TABS
150.0000 mg | ORAL_TABLET | Freq: Every day | ORAL | 0 refills | Status: DC
Start: 2022-03-22 — End: 2022-04-20

## 2022-03-22 MED ORDER — DOXYCYCLINE HYCLATE 100 MG PO CAPS: 100.0000 mg | ORAL_CAPSULE | Freq: Two times a day (BID) | ORAL | 0 refills | Status: AC

## 2022-03-22 NOTE — ED Triage Notes (Signed)
Pt presents to urgent care for vaginal irritation after shaving. She reports not using shaving cream. Pt reports having abdominal pain that started today.

## 2022-03-22 NOTE — Discharge Instructions (Addendum)
-  We treated for gonorrhea with Rocephin today  -For chlamydia - Doxycycline twice daily for 7 days.  Make sure to wear sunscreen while spending time outside while on this medication as it can increase your chance of sunburn. You can take this medication with food if you have a sensitive stomach. -For BV - metrogel at bedtime x5 days. This does not treat for Trich, so we will have to send additional medication if this is positive.  -For your yeast infection, start the Diflucan (fluconazole)- Take one pill today (day 1). If you're still having symptoms in 3 days, take the second pill.  -We have sent testing for sexually transmitted infections. We will notify you of any positive results once they are received. If required, we will prescribe any medications you might need. Please refrain from all sexual activity until treatment is complete.  -Seek additional medical attention if you develop fevers/chills, new/worsening abdominal pain, new/worsening vaginal discomfort/discharge, etc.  -Please check your blood pressure at home or at the pharmacy. If this continues to be >140/90, follow-up with your primary care provider for further blood pressure management/ medication titration. If you develop chest pain, shortness of breath, vision changes, the worst headache of your life- head straight to the ED or call 911.

## 2022-03-22 NOTE — ED Provider Notes (Signed)
[facility]    CSN: [id number] Arrival date & time: 03/22/22  1836      History   Chief Complaint Chief Complaint  Patient presents with   Vaginal Itching   Abdominal Cramping    HPI Samantha Yoder is a 36 y.o. female presenting with vaginal discharge and itching following shaving without shaving cream and intercourse with new female partner.  History endometriosis, hemorrhagic ovarian cyst, nephrolithiasis, cholecystectomy. Malodorous vaginal discharge. Lower abd cramping as if she is on her period. External vaginal itching and irritation. Some dysuria but no frequency or gross hematuria. Denies flank pain.  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 Authorizing Provider  doxycycline (VIBRAMYCIN) 100 MG capsule Take 1 capsule (100 mg total) by mouth 2 (two) times daily for 7 days. 03/22/22 03/29/22 Yes Hazel Sams, PA-C  fluconazole (DIFLUCAN) 150 MG tablet Take 1 tablet (150 mg total) by mouth daily. -For your yeast infection, start the Diflucan (fluconazole)- Take one pill today (day 1). If you're  still having symptoms in 3 days, take the second pill. 03/22/22  Yes Hazel Sams, PA-C  metroNIDAZOLE (METROGEL VAGINAL) 0.75 % vaginal gel Place 1 Applicatorful vaginally at bedtime for 5 days. 03/22/22 03/27/22 Yes Hazel Sams, PA-C  acetaminophen (TYLENOL) 325 MG tablet Take 650 mg by mouth every 6 (six) hours as needed for mild pain or headache.    [provider]  albuterol (VENTOLIN HFA) 108 (90 Base) MCG/ACT inhaler albuterol sulfate HFA 90 mcg/actuation aerosol inhaler    [provider]  amLODipine (NORVASC) 5 MG tablet Take 2 tablets (10 mg total) by mouth daily. 01/31/22   Sherrill Raring, PA-C    Family History Family History  Problem Relation Age of Onset   COPD Mother    Depression Mother    Hypertension Mother    Hypertension Father    Drug abuse Brother    Cancer Maternal Aunt    Hyperlipidemia Maternal Uncle    Miscarriages / Stillbirths Maternal Uncle    Diabetes Maternal Grandmother    Miscarriages / Stillbirths Paternal Grandmother     Social History Social History   Tobacco Use   Smoking status: Every Day    Packs/day: 0.50    Years: 0.00  Total pack years: 0.00    Types: Cigarettes   Smokeless tobacco: Never  Vaping Use   Vaping Use: Never used  Substance Use Topics   Alcohol use: Yes    Comment: occ   Drug use: Yes    Frequency: 7.0 times per week    Types: Marijuana     Allergies   Other   Review of Systems Review of Systems  Genitourinary:  Positive for vaginal discharge.  All other systems reviewed and are negative.    Physical Exam Triage Vital Signs ED Triage Vitals  Enc Vitals Group     BP      Pulse      Resp      Temp      Temp src      SpO2      Weight      Height      Head Circumference      Peak Flow      Pain Score      Pain Loc      Pain Edu?      Excl. in [place]?    No data found.  Updated Vital Signs BP (!) 171/133 (BP Location: Left Arm)   Pulse 81   Resp 18   LMP 03/11/2022   SpO2  99%   Visual Acuity Right Eye Distance:   Left Eye Distance:   Bilateral Distance:    Right Eye Near:   Left Eye Near:    Bilateral Near:     Physical Exam Vitals reviewed.  Constitutional:      General: She is not in acute distress.    Appearance: Normal appearance. She is not ill-appearing.  HENT:     Head: Normocephalic and atraumatic.     Mouth/Throat:     Mouth: Mucous membranes are moist.     Comments: Moist mucous membranes Eyes:     Extraocular Movements: Extraocular movements intact.     Pupils: Pupils are equal, round, and reactive to light.  Cardiovascular:     Rate and Rhythm: Normal rate and regular rhythm.     Heart sounds: Normal heart sounds.  Pulmonary:     Effort: Pulmonary effort is normal.     Breath sounds: Normal breath sounds. No wheezing, rhonchi or rales.  Abdominal:     General: Bowel sounds are normal. There is no distension.     Palpations: Abdomen is soft. There is no mass.     Tenderness: There is abdominal tenderness. There is no right CVA tenderness, left CVA tenderness, guarding or rebound. Negative signs include Murphy's sign, Rovsing's sign and McBurney's sign.     Comments: There is mild tenderness to palpation in the lower quadrants, without guarding rebound or mass.  Comfortable throughout exam  Skin:    General: Skin is warm.     Capillary Refill: Capillary refill takes less than 2 seconds.     Comments: Good skin turgor  Neurological:     General: No focal deficit present.     Mental Status: She is alert and oriented to person, place, and time.  Psychiatric:        Mood and Affect: Mood normal.        Behavior: Behavior normal.      UC Treatments / Results  Labs (all labs ordered are listed, but only abnormal results are displayed) Labs Reviewed  POCT URINALYSIS DIPSTICK, ED / UC - Abnormal; Notable for the following components:      Result  Value   Bilirubin Urine SMALL (*)    Ketones, ur TRACE (*)    Hgb urine dipstick  SMALL (*)    Protein, ur 30 (*)    Leukocytes,Ua LARGE (*)    All other components within normal limits  URINE CULTURE  POC URINE PREG, ED  POC URINE PREG, ED  CERVICOVAGINAL ANCILLARY ONLY    EKG   Radiology No results found.  Procedures Procedures (including critical care time)  Medications Ordered in UC Medications  cefTRIAXone (ROCEPHIN) injection 500 mg (has no administration in time range)    Initial Impression / Assessment and Plan / UC Course  I have reviewed the triage vital signs and the nursing notes.  Pertinent labs & imaging results that were available during my care of the patient were reviewed by me and considered in my medical decision making (see chart for details).     This patient is a very pleasant 36 y.o. year old female presenting with vaginitis following shaving, and intercourse with new female partner. Afebrile, nontachy.  UA large leuk, culture sent.  U-preg negative Will send self-swab for G/C, trich, yeast, BV testing. Declines HIV, RPR. Safe sex precautions.   She prefers to cover for gonorrhea, chlamydia, BV, yeast today.  She apparently does not tolerate metronidazole p.o., and prefers MetroGel for the BV component.  We will have to consider alternative treatment if the trichomonas is positive.  IM Rocephin administered today, doxycycline sent, Diflucan sent, MetroGel sent.  Blood pressure is incidentally elevated, she denies chest pain, shortness of breath, headaches.  ED return precautions discussed. Patient verbalizes understanding and agreement.    Final Clinical Impressions(s) / UC Diagnoses   Final diagnoses:  Vaginitis and vulvovaginitis  Routine screening for STI (sexually transmitted infection)     Discharge Instructions      -We treated for gonorrhea with Rocephin today  -For chlamydia - Doxycycline twice daily for 7 days.  Make sure to wear sunscreen while spending time outside while on this medication as it can increase  your chance of sunburn. You can take this medication with food if you have a sensitive stomach. -For BV - metrogel at bedtime x5 days. This does not treat for Trich, so we will have to send additional medication if this is positive.  -For your yeast infection, start the Diflucan (fluconazole)- Take one pill today (day 1). If you're still having symptoms in 3 days, take the second pill.  -We have sent testing for sexually transmitted infections. We will notify you of any positive results once they are received. If required, we will prescribe any medications you might need. Please refrain from all sexual activity until treatment is complete.  -Seek additional medical attention if you develop fevers/chills, new/worsening abdominal pain, new/worsening vaginal discomfort/discharge, etc.  -Please check your blood pressure at home or at the pharmacy. If this continues to be >140/90, follow-up with your primary care provider for further blood pressure management/ medication titration. If you develop chest pain, shortness of breath, vision changes, the worst headache of your life- head straight to the ED or call 911.      ED Prescriptions     Medication Sig Dispense Auth. Provider   metroNIDAZOLE (METROGEL VAGINAL) 0.75 % vaginal gel Place 1 Applicatorful vaginally at bedtime for 5 days. 70 g Hazel Sams, PA-C   doxycycline (VIBRAMYCIN) 100 MG capsule Take 1 capsule (100 mg total) by mouth 2 (two) times daily for 7 days. 14 capsule Hazel Sams, PA-C  fluconazole (DIFLUCAN) 150 MG tablet Take 1 tablet (150 mg total) by mouth daily. -For your yeast infection, start the Diflucan (fluconazole)- Take one pill today (day 1). If you're still having symptoms in 3 days, take the second pill. 2 tablet Hazel Sams, PA-C      PDMP not reviewed this encounter.   Hazel Sams, PA-C 03/22/22 1929

## 2022-03-23 ENCOUNTER — Telehealth (HOSPITAL_COMMUNITY): Payer: Self-pay

## 2022-03-23 ENCOUNTER — Ambulatory Visit (HOSPITAL_COMMUNITY)
Admission: EM | Admit: 2022-03-23 | Discharge: 2022-03-23 | Disposition: A | Discharge status: Home / Self Care | Payer: Medicaid Other

## 2022-03-23 LAB — CERVICOVAGINAL ANCILLARY ONLY
Bacterial Vaginitis (gardnerella): POSITIVE — AB
Candida Glabrata: NEGATIVE
Candida Vaginitis: NEGATIVE
Chlamydia: NEGATIVE
Comment: NEGATIVE
Comment: NEGATIVE
Comment: NEGATIVE
Comment: NEGATIVE
Comment: NEGATIVE
Comment: NORMAL
Neisseria Gonorrhea: NEGATIVE
Trichomonas: POSITIVE — AB

## 2022-03-23 LAB — URINE CULTURE: Culture: NO GROWTH

## 2022-03-23 MED ORDER — METRONIDAZOLE 500 MG PO TABS: 500.0000 mg | ORAL_TABLET | Freq: Two times a day (BID) | ORAL | 0 refills | Status: DC

## 2022-03-23 NOTE — Telephone Encounter (Addendum)
Pt arrived at urgent for her test results. Pt requested to have Flagyl tablets called into [facility] on [address]. Per Sharyn Lull okay to call in treatment for Trich.

## 2022-03-26 ENCOUNTER — Ambulatory Visit: Payer: Medicaid Other | Admitting: Obstetrics and Gynecology

## 2022-04-20 ENCOUNTER — Encounter (HOSPITAL_COMMUNITY): Payer: Self-pay

## 2022-04-20 ENCOUNTER — Ambulatory Visit (HOSPITAL_COMMUNITY)
Admission: EM | Admit: 2022-04-20 | Discharge: 2022-04-20 | Disposition: A | Discharge status: Home / Self Care | Payer: Medicaid Other | Attending: Internal Medicine | Admitting: Internal Medicine

## 2022-04-20 DIAGNOSIS — Z20822 Contact with and (suspected) exposure to covid-19: Secondary | ICD-10-CM | POA: Insufficient documentation

## 2022-04-20 DIAGNOSIS — J209 Acute bronchitis, unspecified: Secondary | ICD-10-CM

## 2022-04-20 DIAGNOSIS — I10 Essential (primary) hypertension: Secondary | ICD-10-CM | POA: Insufficient documentation

## 2022-04-20 DIAGNOSIS — J029 Acute pharyngitis, unspecified: Secondary | ICD-10-CM

## 2022-04-20 LAB — POCT RAPID STREP A, ED / UC: Streptococcus, Group A Screen (Direct): NEGATIVE

## 2022-04-20 LAB — SARS CORONAVIRUS 2 BY RT PCR: SARS Coronavirus 2 by RT PCR: NEGATIVE

## 2022-04-20 MED ORDER — PREDNISONE 20 MG PO TABS
40.0000 mg | ORAL_TABLET | Freq: Every day | ORAL | 0 refills | Status: AC
Start: 2022-04-20 — End: ?

## 2022-04-20 MED ORDER — IPRATROPIUM-ALBUTEROL 0.5-2.5 (3) MG/3ML IN SOLN
RESPIRATORY_TRACT | Status: AC
  Filled 2022-04-20: qty 3

## 2022-04-20 MED ORDER — IPRATROPIUM-ALBUTEROL 0.5-2.5 (3) MG/3ML IN SOLN
3.0000 mL | Freq: Once | RESPIRATORY_TRACT | Status: AC
  Administered 2022-04-20: 3 mL via RESPIRATORY_TRACT

## 2022-04-20 MED ORDER — AMLODIPINE BESYLATE 5 MG PO TABS: 5.0000 mg | ORAL_TABLET | Freq: Two times a day (BID) | ORAL | 1 refills | Status: DC

## 2022-04-20 MED ORDER — CETIRIZINE HCL 10 MG PO TABS
10.0000 mg | ORAL_TABLET | Freq: Every day | ORAL | 2 refills | Status: AC
Start: 2022-04-20 — End: ?

## 2022-04-20 NOTE — ED Provider Notes (Signed)
[place]    CSN: [id number] Arrival date & time: 04/20/22  1304      History   Chief Complaint Chief Complaint  Patient presents with   Sore Throat    HPI Samantha Yoder is a 36 y.o. female.   Patient presents urgent care for evaluation of hoarse voice, sore throat, cough, and pain with swallowing started yesterday morning.  Patient is also reporting nasal congestion with thick clear mucus.  Cough is productive with clear phlegm.  Patient took Percocet for her throat pain with some relief.  Pain to the throat is currently mild at this time and worse with swallowing.  Voice is very hoarse and patient denies recent excessive yelling/use of her voice.  Reports neck pain that is mostly to the right side of her neck and feels like a "spasm".  Denies feelings of throat closing/tightening.  No shortness of breath, chest pain, headache, ear pain, nausea, vomiting, abdominal pain, fever/chills, and heart palpitations.  Denies watery itchy eyes and eye drainage.  She states that she sleeps with a fan on at night due to hot flashes and believes that this may have exacerbated her symptoms.  Patient states that she has been "congested ever since she had COVID-19 in January 2022".  She has a chronic cough and is a current daily cigarette smoker and also smokes marijuana occasionally.  She does not have an albuterol inhaler at home and would like a refill of this.  Reports history of asthma as well but has not had an asthma flare in a long time.  Reports history of allergic rhinitis but does not currently take any medications for this.  No known sick contacts.  Reports getting into her boyfriend's car a week ago and inhaling a lot of dust particles.  She believes that this may have exacerbated her symptoms as well.    Blood pressure is notably elevated in the clinic today at 214/141.  Patient has not been taking her amlodipine for the last month and states that this is because she would like  to be able to drink alcohol but does not want to drink while taking the amlodipine medication.  She also has a difficult time remembering to take the medication as prescribed in the morning and at night.  States she "has plenty of the medication" but simply has not been taking it.  Denies headache, blurry vision, and decreased visual acuity.  States that she sees black spots in her vision every day and this is not new for her.  No history of heart attack or stroke.  No shortness of breath, one-sided weakness, chest pain, worsening fatigue, or diaphoresis reported.  Patient does not have a primary care provider.   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 Authorizing Provider  amLODipine (NORVASC) 5 MG tablet Take 1 tablet (5 mg total) by mouth in the morning and at bedtime. 04/20/22 06/19/22 Yes Theodoros Stjames, Stasia Cavalier, FNP  cetirizine (ZYRTEC) 10 MG tablet Take 1 tablet (10 mg total) by mouth daily. 04/20/22  Yes Talbot Grumbling, FNP  predniSONE (DELTASONE) 20 MG tablet Take 2 tablets (40 mg total) by mouth daily.  04/20/22  Yes Talbot Grumbling, FNP  acetaminophen (TYLENOL) 325 MG tablet Take 650 mg by mouth every 6 (six) hours as needed for mild pain or headache.    [provider]  albuterol (VENTOLIN HFA) 108 (90 Base) MCG/ACT inhaler albuterol sulfate HFA 90 mcg/actuation aerosol inhaler    [provider]    Family History Family History  Problem Relation Age of Onset   COPD Mother    Depression Mother    Hypertension Mother    Hypertension Father    Drug abuse Brother    Cancer Maternal Aunt    Hyperlipidemia Maternal Uncle    Miscarriages / Stillbirths Maternal Uncle    Diabetes Maternal Grandmother    Miscarriages / Stillbirths Paternal Grandmother     Social History Social History   Tobacco Use   Smoking status: Every Day    Packs/day: 0.50    Years: 0.00    Total pack years: 0.00    Types: Cigarettes   Smokeless tobacco: Never  Vaping Use   Vaping Use: Never used  Substance Use Topics   Alcohol use: Yes    Comment: occ   Drug use: Yes    Frequency: 7.0 times per week    Types: Marijuana     Allergies   Other   Review of Systems Review of Systems Per HPI  Physical Exam Triage Vital Signs ED Triage Vitals  Enc Vitals Group     BP 04/20/22 1318 (!) 215/141     Pulse Rate 04/20/22 1318 86     Resp 04/20/22 1318 18     Temp 04/20/22 1318 98.1 F (36.7 C)     Temp Source 04/20/22 1318 Oral     SpO2 04/20/22 1318 100 %     Weight --      Height --      Head Circumference --      Peak Flow --      Pain Score 04/20/22 1319 0     Pain Loc --      Pain Edu? --      Excl. in [place]? --    No data found.  Updated Vital Signs BP (!) 195/138   Pulse 86   Temp 98.1 F (36.7 C) (Oral)   Resp 18   LMP 04/11/2022   SpO2 100%   Visual Acuity Right Eye Distance:   Left Eye Distance:   Bilateral Distance:    Right Eye Near:   Left Eye Near:    Bilateral Near:     Physical Exam Vitals and nursing note reviewed.  Constitutional:  Appearance: Normal appearance. She is not ill-appearing or toxic-appearing.     Comments: Very pleasant patient sitting on exam in position of comfort table in no acute distress.   HENT:     Head: Normocephalic and atraumatic.     Right Ear: Hearing, tympanic membrane, ear canal and external ear normal.     Left Ear: Hearing, tympanic membrane, ear canal and external ear normal.     Nose: Nose normal.     Mouth/Throat:     Lips: Pink.     Mouth: Mucous membranes are moist.     Comments: Mild erythema to the posterior oropharynx with clear postnasal drainage visualized.  Airway is intact.  Eyes:     General: Lids are normal. Vision grossly intact. Gaze aligned appropriately.     Extraocular Movements: Extraocular movements intact.     Conjunctiva/sclera: Conjunctivae normal.     Right eye: Right conjunctiva is not injected.     Left eye: Left conjunctiva is not injected.     Pupils: Pupils are equal, round, and reactive to light.     Comments: No dizziness or pain elicited with extraocular movement exam.  Cardiovascular:     Rate and Rhythm: Normal rate and regular rhythm.     Heart sounds: Normal heart sounds, S1 normal and S2 normal.  Pulmonary:     Effort: Pulmonary effort is normal. No respiratory distress.     Breath sounds: Normal air entry. Wheezing and rhonchi present.     Comments: Diffuse rhonchi and wheezing heard throughout all lung fields.  No acute respiratory distress noted.  Abdominal:     Palpations: Abdomen is soft.  Musculoskeletal:     Cervical back: Normal range of motion and neck supple.  Lymphadenopathy:     Cervical: Cervical adenopathy present.  Skin:    General: Skin is warm and dry.     Capillary Refill: Capillary refill takes less than 2 seconds.     Findings: No rash.  Neurological:     General: No focal deficit present.     Mental Status: She is alert and oriented to person, place, and time. Mental status is at baseline.     Cranial Nerves:  Cranial nerves 2-12 are intact. No dysarthria or facial asymmetry.     Sensory: Sensation is intact.     Motor: Motor function is intact. No weakness, tremor or pronator drift.     Coordination: Coordination is intact.     Gait: Gait is intact.     Comments: 5/5 power throughout.  No focal neurologic deficit.  Psychiatric:        Mood and Affect: Mood normal.        Speech: Speech normal.        Behavior: Behavior normal.        Thought Content: Thought content normal.        Judgment: Judgment normal.      UC Treatments / Results  Labs (all labs ordered are listed, but only abnormal results are displayed) Labs Reviewed  CULTURE, GROUP A STREP ([place])  SARS CORONAVIRUS 2 BY RT PCR  POCT RAPID STREP A, ED / UC    EKG   Radiology No results found.  Procedures Procedures (including critical care time)  Medications Ordered in UC Medications  ipratropium-albuterol (DUONEB) 0.5-2.5 (3) MG/3ML nebulizer solution 3 mL (3 mLs Nebulization Given 04/20/22 1435)    Initial Impression / Assessment and Plan / UC Course  I have reviewed the triage vital signs  and the nursing notes.  Pertinent labs & imaging results that were available during my care of the patient were reviewed by me and considered in my medical decision making (see chart for details).   1.  Acute bronchitis Symptoms and physical exam are consistent with acute bronchitis.  Patient states that her breathing "feels fine" but she does have a significant cough in the clinic.  Patient given DuoNeb treatment due to wheezing and rhonchi heard in all lung fields.  Breath sounds improved after nebulizer treatment.  Albuterol inhaler refilled for home use 1 to 2 puffs every 4-6 hours for cough, shortness of breath, and wheezing.  Patient to use cetirizine once daily to dry up nasal mucus and for allergy symptoms.  Patient to begin taking prednisone 40 mg once daily with food for the next 5 days to treat inflammation in the lungs.   Group A strep testing is negative in the clinic.  Throat culture is pending.  Suspect that she will recover from this with conservative treatment with prescriptions for symptom management.  Deferred imaging based on stable cardiopulmonary exam and hemodynamically stable vital signs.   COVID 2-hour test is pending.  Patient is on day 4 of her symptoms and would benefit from molnupiravir antiviral therapy if COVID test is positive due to risk for severe illness.    2.  Essential hypertension Neurologic exam is without focal deficit.  Blood pressure reduced upon recheck to 195/138.  Discussed need for consistent antihypertensive therapy at length with patient who verbalizes understanding and agreement with plan to start amlodipine 5 mg twice daily ongoing.  Recommend she keep her pills in a pillbox so that she is able to take them daily consistently for best therapeutic effect.  She may start taking amlodipine 5 mg once daily for the next 2 to 3 days then increase to twice daily going forward.  PCP assistance initiated today.  Patient given the website for self scheduling for PCP appointment through [facility] health.  Discussed reducing salt in diet and attempting to stop smoking as these will both positively affect her health significantly and reduce her blood pressure.  Patient verbalizes understanding and agreement with plan.  Strict ED return precautions given.   Discussed physical exam and available lab work findings in clinic with patient.  Counseled patient regarding appropriate use of medications and potential side effects for all medications recommended or prescribed today. Discussed red flag signs and symptoms of worsening condition,when to call the PCP office, return to urgent care, and when to seek higher level of care in the emergency department. Patient verbalizes understanding and agreement with plan. All questions answered. Patient discharged in stable condition.  Final Clinical Impressions(s) / UC  Diagnoses   Final diagnoses:  Acute bronchitis, unspecified organism  Essential hypertension     Discharge Instructions      You likely have viral bronchitis which is inflammation of the upper airways.   I gave you a breathing treatment in the clinic.  You may use albuterol inhaler every 4-6 hours as needed for cough, shortness of breath, and wheeze.   I would like for you to start taking prednisone 40 mg once daily with breakfast for the next 5 days to reduce inflammation in your lungs causing cough and wheeze.   Start taking amlodipine 5 mg once daily for 2 to 3 days, then increase to twice daily.  Reduce the amount of salt in your diet and attempt to stop smoking as this will help your  blood pressure come down.   Take cetirizine once daily to dry up nasal mucus and for allergy symptoms.  Your strep throat culture is pending.  We will call you if your culture grows any bacteria requiring an antibiotic in the next 2 to 3 days.  To find a primary care provider go to [email or web address] and follow the prompts to schedule a new patient appointment for primary care.  Someone from [facility] health will be reaching out to you either via MyChart or phone call to help you set up a primary care provider appointment as well.  It is very important to have a primary care provider to manage your overall health and prevent/manage chronic medical conditions.   If you develop any new or worsening symptoms or do not improve in the next 2 to 3 days, please return.  If your symptoms are severe, please go to the emergency room.  Follow-up with your primary care provider for further evaluation and management of your symptoms as well as ongoing wellness visits.  I hope you feel better!     ED Prescriptions     Medication Sig Dispense Auth. Provider   amLODipine (NORVASC) 5 MG tablet Take 1 tablet (5 mg total) by mouth in the morning and at bedtime. 60 tablet Talbot Grumbling, FNP    cetirizine (ZYRTEC) 10 MG tablet Take 1 tablet (10 mg total) by mouth daily. 30 tablet Joella Prince M, FNP   predniSONE (DELTASONE) 20 MG tablet Take 2 tablets (40 mg total) by mouth daily. 10 tablet Talbot Grumbling, FNP      PDMP not reviewed this encounter.   Talbot Grumbling, [place] 04/20/22 1622

## 2022-04-20 NOTE — Discharge Instructions (Addendum)
You likely have viral bronchitis which is inflammation of the upper airways.   I gave you a breathing treatment in the clinic.  You may use albuterol inhaler every 4-6 hours as needed for cough, shortness of breath, and wheeze.   I would like for you to start taking prednisone 40 mg once daily with breakfast for the next 5 days to reduce inflammation in your lungs causing cough and wheeze.   Start taking amlodipine 5 mg once daily for 2 to 3 days, then increase to twice daily.  Reduce the amount of salt in your diet and attempt to stop smoking as this will help your blood pressure come down.   Take cetirizine once daily to dry up nasal mucus and for allergy symptoms.  Your strep throat culture is pending.  We will call you if your culture grows any bacteria requiring an antibiotic in the next 2 to 3 days.  To find a primary care provider go to [email or web address] and follow the prompts to schedule a new patient appointment for primary care.  Someone from [facility] health will be reaching out to you either via MyChart or phone call to help you set up a primary care provider appointment as well.  It is very important to have a primary care provider to manage your overall health and prevent/manage chronic medical conditions.   If you develop any new or worsening symptoms or do not improve in the next 2 to 3 days, please return.  If your symptoms are severe, please go to the emergency room.  Follow-up with your primary care provider for further evaluation and management of your symptoms as well as ongoing wellness visits.  I hope you feel better!

## 2022-04-20 NOTE — ED Triage Notes (Signed)
Pt c/o sore throat, lost voice, neck swelling, and pain on swallowing since yesterday. Taking OTC meds with no relief.

## 2022-04-23 LAB — CULTURE, GROUP A STREP (THRC)

## 2022-05-11 ENCOUNTER — Ambulatory Visit
Admission: EM | Admit: 2022-05-11 | Discharge: 2022-05-11 | Disposition: A | Discharge status: Home / Self Care | Payer: Medicaid Other | Attending: Physician Assistant | Admitting: Physician Assistant

## 2022-05-11 DIAGNOSIS — R339 Retention of urine, unspecified: Secondary | ICD-10-CM | POA: Insufficient documentation

## 2022-05-11 DIAGNOSIS — Z113 Encounter for screening for infections with a predominantly sexual mode of transmission: Secondary | ICD-10-CM | POA: Insufficient documentation

## 2022-05-11 LAB — POCT URINALYSIS DIP (MANUAL ENTRY)
Bilirubin, UA: NEGATIVE
Glucose, UA: NEGATIVE mg/dL
Ketones, POC UA: NEGATIVE mg/dL
Leukocytes, UA: NEGATIVE
Nitrite, UA: NEGATIVE
Protein Ur, POC: NEGATIVE mg/dL
Spec Grav, UA: 1.025 (ref 1.010–1.025)
Urobilinogen, UA: 0.2 E.U./dL
pH, UA: 6.5 (ref 5.0–8.0)

## 2022-05-11 LAB — POCT URINE PREGNANCY: Preg Test, Ur: NEGATIVE

## 2022-05-11 NOTE — ED Provider Notes (Signed)
[facility]    CSN: [id number] Arrival date & time: 05/11/22  0831      History   Chief Complaint Chief Complaint  Patient presents with   Vaginal Discharge   Urinary Retention    HPI Samantha Yoder is a 36 y.o. female.   Here today for evaluation of possible vaginal discharge as well as bladder fullness.  She reports that she has had some difficulty urinating and is not sure if this is related to infection.  She reports that she does have known endometriosis and indeterminate stress echo yesterday.  She notes that her mother does have history of bladder issues.  She notes that symptoms started 2 days ago and she did have unprotected sex 2 weeks ago.  She would like to proceed with STD screening as well.  The history is provided by the patient.  Vaginal Discharge Associated symptoms: no abdominal pain, no dysuria, no fever, no nausea and no vomiting     Past Medical History:  Diagnosis Date   Asthma    Deliberate self-cutting    Depression    Endometriosis    Epistaxis    Hypertension    Kidney stones    Kidney stones     Patient Active Problem List   Diagnosis Date Noted   Hemorrhagic ovarian cyst 03/27/2020   Tobacco use 10/29/2018   Genital herpes simplex 10/07/2017   Asthma 05/20/2017   Essential hypertension 03/11/2017   Acute endometritis 03/11/2017   Endometriosis 03/23/2016   MDD (major depressive disorder), recurrent severe, 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 Authorizing Provider  acetaminophen (TYLENOL) 325 MG tablet Take 650 mg by mouth every 6 (six) hours as  needed for mild pain or headache.    [provider]  albuterol (VENTOLIN HFA) 108 (90 Base) MCG/ACT inhaler albuterol sulfate HFA 90 mcg/actuation aerosol inhaler    [provider]  amLODipine (NORVASC) 5 MG tablet Take 1 tablet (5 mg total) by mouth in the morning and at bedtime. 04/20/22 06/19/22  Talbot Grumbling, FNP  cetirizine (ZYRTEC) 10 MG tablet Take 1 tablet (10 mg total) by mouth daily. 04/20/22   Talbot Grumbling, FNP  predniSONE (DELTASONE) 20 MG tablet Take 2 tablets (40 mg total) by mouth daily. 04/20/22   Talbot Grumbling, FNP    Family History Family History  Problem Relation Age of Onset   COPD Mother    Depression Mother    Hypertension Mother    Hypertension Father    Drug abuse Brother    Cancer Maternal Aunt    Hyperlipidemia Maternal Uncle    Miscarriages / Stillbirths Maternal Uncle    Diabetes Maternal Grandmother    Miscarriages / Stillbirths Paternal Grandmother     Social History Social History   Tobacco Use   Smoking status: Every Day  Packs/day: 0.50    Years: 0.00    Total pack years: 0.00    Types: Cigarettes   Smokeless tobacco: Never  Vaping Use   Vaping Use: Never used  Substance Use Topics   Alcohol use: Yes    Alcohol/week: 10.0 standard drinks of alcohol    Types: 5 Glasses of wine, 5 Shots of liquor per week   Drug use: Yes    Frequency: 7.0 times per week    Types: Marijuana     Allergies   Other   Review of Systems Review of Systems  Constitutional:  Negative for chills and fever.  Eyes:  Negative for discharge and redness.  Gastrointestinal:  Negative for abdominal pain, nausea and vomiting.  Genitourinary:  Positive for difficulty urinating and vaginal discharge. Negative for dysuria.     Physical Exam Triage Vital Signs ED Triage Vitals  Enc Vitals Group     BP 05/11/22 0858 (S) (!) 117/113     Pulse Rate 05/11/22 0858 71     Resp 05/11/22 0858 18     Temp 05/11/22 0858 97.9  F (36.6 C)     Temp src --      SpO2 05/11/22 0858 96 %     Weight --      Height --      Head Circumference --      Peak Flow --      Pain Score 05/11/22 0856 3     Pain Loc --      Pain Edu? --      Excl. in [place]? --    No data found.  Updated Vital Signs BP (S) (!) 173/113 Comment: pt reports she has not taken bp meds this morning  Pulse 71   Temp 97.9 F (36.6 C)   Resp 18   LMP 04/11/2022   SpO2 96%      Physical Exam Vitals and nursing note reviewed.  Constitutional:      General: She is not in acute distress.    Appearance: Normal appearance. She is not ill-appearing.  HENT:     Head: Normocephalic and atraumatic.  Eyes:     Conjunctiva/sclera: Conjunctivae normal.  Cardiovascular:     Rate and Rhythm: Normal rate.  Pulmonary:     Effort: Pulmonary effort is normal.  Neurological:     Mental Status: She is alert.  Psychiatric:        Mood and Affect: Mood normal.        Behavior: Behavior normal.        Thought Content: Thought content normal.      UC Treatments / Results  Labs (all labs ordered are listed, but only abnormal results are displayed) Labs Reviewed  POCT URINALYSIS DIP (MANUAL ENTRY) - Abnormal; Notable for the following components:      Result Value   Blood, UA small (*)    All other components within normal limits  URINE CULTURE  HIV ANTIBODY (ROUTINE TESTING W REFLEX)  HEPATITIS PANEL, ACUTE  RPR  POCT URINE PREGNANCY  CERVICOVAGINAL ANCILLARY ONLY    EKG   Radiology No results found.  Procedures Procedures (including critical care time)  Medications Ordered in UC Medications - No data to display  Initial Impression / Assessment and Plan / UC Course  I have reviewed the triage vital signs and the nursing notes.  Pertinent labs & imaging results that were available during my care of the patient were reviewed by me and considered in my  medical decision making (see chart for details).    UA without signs of UTI- will  order urine culture. STD screening ordered as well. Recommended follow up with PCP for further recommendation of bladder concerns.   Final Clinical Impressions(s) / UC Diagnoses   Final diagnoses:  Urinary retention  Screening for STD (sexually transmitted disease)   Discharge Instructions   None    ED Prescriptions   None    PDMP not reviewed this encounter.   Francene Finders, PA-C 05/11/22 [id number]

## 2022-05-11 NOTE — ED Triage Notes (Signed)
Pt presents to uc with co of vaginal discharge, pt st she has endometriosis and just ended her menstral cycle yesterday. Pt st she is unsure if this was urine or discharge, pt st she thinks it was urine. Pt reports difficulty urinating and the sensation of bladder fullness. Pt reports symptoms started 2 days ago. Pt asked about sti likelihood pt st she did have unprotected sex 2 weeks ago and we spoke about sti testing while here.

## 2022-05-12 LAB — URINE CULTURE: Culture: NO GROWTH

## 2022-05-14 ENCOUNTER — Telehealth (HOSPITAL_COMMUNITY): Payer: Self-pay | Admitting: Emergency Medicine

## 2022-05-14 LAB — CERVICOVAGINAL ANCILLARY ONLY
Bacterial Vaginitis (gardnerella): POSITIVE — AB
Candida Glabrata: NEGATIVE
Candida Vaginitis: NEGATIVE
Chlamydia: NEGATIVE
Comment: NEGATIVE
Comment: NEGATIVE
Comment: NEGATIVE
Comment: NEGATIVE
Comment: NEGATIVE
Comment: NORMAL
Neisseria Gonorrhea: NEGATIVE
Trichomonas: NEGATIVE

## 2022-05-14 MED ORDER — FLUCONAZOLE 150 MG PO TABS: 150.0000 mg | ORAL_TABLET | Freq: Once | ORAL | 0 refills | Status: AC

## 2022-05-14 MED ORDER — METRONIDAZOLE 0.75 % VA GEL: 1.0000 | Freq: Every day | VAGINAL | 0 refills | Status: AC

## 2022-05-17 LAB — RPR: RPR Ser Ql: NONREACTIVE

## 2022-05-17 LAB — HIV ANTIBODY (ROUTINE TESTING W REFLEX): HIV Screen 4th Generation wRfx: NONREACTIVE

## 2022-05-18 LAB — SPECIMEN STATUS REPORT

## 2022-05-18 LAB — HCV INTERPRETATION

## 2022-05-18 LAB — ACUTE VIRAL HEPATITIS (HAV, HBV, HCV)
HCV Ab: NONREACTIVE
Hep A IgM: NEGATIVE
Hep B C IgM: NEGATIVE
Hepatitis B Surface Ag: NEGATIVE

## 2022-09-26 ENCOUNTER — Other Ambulatory Visit: Payer: Self-pay

## 2022-09-26 ENCOUNTER — Emergency Department (HOSPITAL_BASED_OUTPATIENT_CLINIC_OR_DEPARTMENT_OTHER): Payer: Self-pay | Admitting: Radiology

## 2022-09-26 ENCOUNTER — Encounter (HOSPITAL_BASED_OUTPATIENT_CLINIC_OR_DEPARTMENT_OTHER): Payer: Self-pay

## 2022-09-26 ENCOUNTER — Emergency Department (HOSPITAL_BASED_OUTPATIENT_CLINIC_OR_DEPARTMENT_OTHER)
Admission: EM | Admit: 2022-09-26 | Discharge: 2022-09-26 | Disposition: A | Discharge status: Home / Self Care | Payer: Self-pay | Attending: Emergency Medicine | Admitting: Emergency Medicine

## 2022-09-26 DIAGNOSIS — J45909 Unspecified asthma, uncomplicated: Secondary | ICD-10-CM

## 2022-09-26 DIAGNOSIS — I10 Essential (primary) hypertension: Secondary | ICD-10-CM | POA: Insufficient documentation

## 2022-09-26 DIAGNOSIS — K047 Periapical abscess without sinus: Secondary | ICD-10-CM

## 2022-09-26 DIAGNOSIS — Z7951 Long term (current) use of inhaled steroids: Secondary | ICD-10-CM | POA: Insufficient documentation

## 2022-09-26 DIAGNOSIS — R0602 Shortness of breath: Secondary | ICD-10-CM | POA: Insufficient documentation

## 2022-09-26 DIAGNOSIS — F17200 Nicotine dependence, unspecified, uncomplicated: Secondary | ICD-10-CM | POA: Insufficient documentation

## 2022-09-26 DIAGNOSIS — Z79899 Other long term (current) drug therapy: Secondary | ICD-10-CM | POA: Insufficient documentation

## 2022-09-26 LAB — COMPREHENSIVE METABOLIC PANEL
ALT: 8 U/L (ref 0–44)
AST: 9 U/L — ABNORMAL LOW (ref 15–41)
Albumin: 4.1 g/dL (ref 3.5–5.0)
Alkaline Phosphatase: 57 U/L (ref 38–126)
Anion gap: 8 (ref 5–15)
BUN: 15 mg/dL (ref 6–20)
CO2: 26 mmol/L (ref 22–32)
Calcium: 9.4 mg/dL (ref 8.9–10.3)
Chloride: 104 mmol/L (ref 98–111)
Creatinine, Ser: 1.18 mg/dL — ABNORMAL HIGH (ref 0.44–1.00)
GFR, Estimated: >60 mL/min (ref >60)
Glucose, Bld: 95 mg/dL (ref 70–99)
Potassium: 3.7 mmol/L (ref 3.5–5.1)
Sodium: 138 mmol/L (ref 135–145)
Total Bilirubin: 0.3 mg/dL (ref 0.3–1.2)
Total Protein: 7.4 g/dL (ref 6.5–8.1)

## 2022-09-26 LAB — CBC WITH DIFFERENTIAL/PLATELET
Abs Immature Granulocytes: 0.01 10*3/uL (ref 0.00–0.07)
Basophils Absolute: 0 10*3/uL (ref 0.0–0.1)
Basophils Relative: 1 %
Eosinophils Absolute: 0.2 10*3/uL (ref 0.0–0.5)
Eosinophils Relative: 3 %
HCT: 42.3 % (ref 36.0–46.0)
Hemoglobin: 14.1 g/dL (ref 12.0–15.0)
Immature Granulocytes: 0 %
Lymphocytes Relative: 36 %
Lymphs Abs: 2.4 10*3/uL (ref 0.7–4.0)
MCH: 28.4 pg (ref 26.0–34.0)
MCHC: 33.3 g/dL (ref 30.0–36.0)
MCV: 85.3 fL (ref 80.0–100.0)
Monocytes Absolute: 0.4 10*3/uL (ref 0.1–1.0)
Monocytes Relative: 6 %
Neutro Abs: 3.6 10*3/uL (ref 1.7–7.7)
Neutrophils Relative %: 54 %
Platelets: 301 10*3/uL (ref 150–400)
RBC: 4.96 MIL/uL (ref 3.87–5.11)
RDW: 14.8 % (ref 11.5–15.5)
WBC: 6.6 10*3/uL (ref 4.0–10.5)
nRBC: 0 % (ref 0.0–0.2)

## 2022-09-26 LAB — ACETAMINOPHEN LEVEL: Acetaminophen (Tylenol), Serum: <10 ug/mL — ABNORMAL LOW (ref 10–30)

## 2022-09-26 LAB — SALICYLATE LEVEL: Salicylate Lvl: <7 mg/dL — ABNORMAL LOW (ref 7.0–30.0)

## 2022-09-26 LAB — APTT: aPTT: 31 seconds (ref 24–36)

## 2022-09-26 LAB — PROTIME-INR
INR: 0.9 (ref 0.8–1.2)
Prothrombin Time: 12.4 seconds (ref 11.4–15.2)

## 2022-09-26 MED ORDER — AMOXICILLIN-POT CLAVULANATE 875-125 MG PO TABS
1.0000 | ORAL_TABLET | Freq: Once | ORAL | Status: AC
  Administered 2022-09-26: 1 via ORAL
  Filled 2022-09-26: qty 1

## 2022-09-26 MED ORDER — FLUCONAZOLE 150 MG PO TABS: 150.0000 mg | ORAL_TABLET | Freq: Once | ORAL | 0 refills | Status: AC

## 2022-09-26 MED ORDER — OXYCODONE HCL 5 MG PO TABS
5.0000 mg | ORAL_TABLET | Freq: Once | ORAL | Status: AC
  Administered 2022-09-26: 5 mg via ORAL
  Filled 2022-09-26: qty 1

## 2022-09-26 MED ORDER — AMOXICILLIN-POT CLAVULANATE 875-125 MG PO TABS: 1.0000 | ORAL_TABLET | Freq: Once | ORAL | Status: DC

## 2022-09-26 MED ORDER — ACETAMINOPHEN 500 MG PO TABS: 1000.0000 mg | ORAL_TABLET | Freq: Once | ORAL | Status: DC

## 2022-09-26 MED ORDER — AMOXICILLIN-POT CLAVULANATE 875-125 MG PO TABS: 1.0000 | ORAL_TABLET | Freq: Two times a day (BID) | ORAL | 0 refills | Status: DC

## 2022-09-26 MED ORDER — IPRATROPIUM-ALBUTEROL 0.5-2.5 (3) MG/3ML IN SOLN
3.0000 mL | Freq: Once | RESPIRATORY_TRACT | Status: AC
  Administered 2022-09-26: 3 mL via RESPIRATORY_TRACT
  Filled 2022-09-26: qty 3

## 2022-09-26 MED ORDER — OXYCODONE HCL 5 MG PO TABS: 5.0000 mg | ORAL_TABLET | ORAL | 0 refills | Status: AC | PRN

## 2022-09-26 NOTE — ED Notes (Signed)
Pt ambulatory to radiology at this time for 2 view-cxr.  Gait steady; no acute changes/distress noted.

## 2022-09-26 NOTE — ED Notes (Signed)
Pt agreeable with d/c plan as discussed by provider- this nurse has verbally reinforced d/c instructions and provided pt with written copy.  Pt acknowledges verbal understanding and denies any addl questions, concerns needs.

## 2022-09-26 NOTE — Discharge Instructions (Addendum)
Thank you for coming to [facility] Emergency Department. You were seen for dental infection. We did an exam, labs, and imaging, and these showed likely a dental abscess.  Please take only as much aspirin as instructed on the label. You can take up to 650 mg of aspirin every 4-6 hours. You can add tylenol 1,000 mg every 8 hours as well. Do not take Goody's powder while taking these medications. We will also prescribe a few pills of oxycodone '5mg'$  every 4-6 hours to help get you to a dentist appointment.  We will prescribe Augmentin to take 1 pill every 12 hours for 7 days but the definitive treatment is removal of the tooth.  You need to see a dentist.  We have attached low-cost dental options for you to call tomorrow. Please use your home breathing treatments as prescribed. Please follow up with your primary care provider within 1 week.   Do not hesitate to return to the ED or call 911 if you experience: -Worsening symptoms -Worsening facial swelling -Chest pain shortness of breath -Lightheadedness, passing out -Fevers/chills -Anything else that concerns you

## 2022-09-26 NOTE — ED Provider Notes (Signed)
[facility] Provider Note   CSN: [id number] Arrival date & time: 09/26/22  1721     History  Chief Complaint  Patient presents with   Dental Pain    Samantha Yoder is a 37 y.o. female with HTN, tobacco use, endometriosis, history of endometritis, asthma, MDD, who presents with dental pain.   Endorses infection/cavity to Left Lower Molar that has been bothersome for a month but worsened approximately 1 week. No Drainage. No fevers/chills, SOB/difficulty breathing, neck swelling. No drainage inside her mouth. Has been using aspirin and goody's powder for pain control. States she has been taking three 325 mg aspirin every 2-3 hours for approximately one week except when she is sleeping. Noted that her menstrual cycle started yesterday and it is much heavier flow than usual. Also notes she has had some bright red blood in her stool that started 2 days ago. No h/o similar. Denies vomiting. Does endorse some nausea. Does endorse trismus.  Notes she is just getting over a "cold" but tested negative for covid, still has some chest congestion. States she has asthma and is always wheezing, feels mildly SOB right now but not more than normal.    Dental Pain      Home Medications Prior to Admission medications   Medication Sig Start Date End Date Taking? Authorizing Provider  acetaminophen (TYLENOL) 325 MG tablet Take 650 mg by mouth every 6 (six) hours as needed for mild pain or headache.    [provider]  albuterol (VENTOLIN HFA) 108 (90 Base) MCG/ACT inhaler albuterol sulfate HFA 90 mcg/actuation aerosol inhaler    [provider]  amLODipine (NORVASC) 5 MG tablet Take 1 tablet (5 mg total) by mouth in the morning and at bedtime. 04/20/22 06/19/22  Talbot Grumbling, FNP  cetirizine (ZYRTEC) 10 MG tablet Take 1 tablet (10 mg total) by mouth daily. 04/20/22   Talbot Grumbling, FNP  predniSONE (DELTASONE) 20 MG tablet  Take 2 tablets (40 mg total) by mouth daily. 04/20/22   Talbot Grumbling, FNP      Allergies    Other    Review of Systems   Review of Systems Review of systems Negative for f/c.  A 10 point review of systems was performed and is negative unless otherwise reported in HPI.  Physical Exam Updated Vital Signs BP (!) 196/122 (BP Location: Right Arm)   Pulse 83   Temp 99.4 F (37.4 C) (Oral)   Resp 16   Ht '4\' 10"'$  (1.473 m)   Wt 83.5 kg   SpO2 98%   BMI 38.47 kg/m  Physical Exam General: Normal appearing female, lying in bed.  HEENT: PERRLA, EOMI, Sclera anicteric, MMM, trachea midline.  Intraorally patient with poor dentition and swelling around tooth #18 with TTP. Mild erythema around the area but no succinct drainable fluid collection or area of fluctuance. Tenderness to palpation of the left posterior mandible, angle of the mandible, and inferior to left ear.  There is no erythema, swelling, significant tenderness palpation over the mastoid.  Patient does have mild tenderness palpation with manipulation of the external auricle on the left but there is no erythema, swelling, purulent drainage of the external auditory canal and the left tympanic membrane with normal cone of light and no fluid behind. Cardiology: RRR, no murmurs/rubs/gallops. BL radial and DP pulses equal bilaterally.  Resp: Normal respiratory rate and effort. CTAB, no wheezes, rhonchi, crackles.  Abd: Soft, non-tender, non-distended. No rebound tenderness  or guarding.  Rectal/GU: patient declined MSK: No peripheral edema or signs of trauma. Extremities without deformity or TTP. No cyanosis or clubbing. Skin: warm, dry. No rashes or lesions. Back: No CVA tenderness Neuro: A&Ox4, CNs II-XII grossly intact. MAEs. Sensation grossly intact.  Psych: Normal mood and affect.   ED Results / Procedures / Treatments   Labs (all labs ordered are listed, but only abnormal results are displayed) Labs Reviewed  COMPREHENSIVE  METABOLIC PANEL - Abnormal; Notable for the following components:      Result Value   Creatinine, Ser 1.18 (*)    AST 9 (*)    All other components within normal limits  CBC WITH DIFFERENTIAL/PLATELET  SALICYLATE LEVEL  ACETAMINOPHEN LEVEL  OCCULT BLOOD X 1 CARD TO LAB, STOOL  PROTIME-INR  APTT    EKG EKG Interpretation  Date/Time:  Wednesday September 26 2022 20:30:41 EST Ventricular Rate:  71 PR Interval:  167 QRS Duration: 97 QT Interval:  393 QTC Calculation: 428 R Axis:   88 Text Interpretation: Sinus rhythm Confirmed by Cindee Lame [phone]) on 09/26/2022 8:32:46 PM  Radiology DG Chest 2 View  Result Date: 09/26/2022 CLINICAL DATA:  Wheezing and cough EXAM: CHEST - 2 VIEW COMPARISON:  05/29/2019 FINDINGS: The heart size and mediastinal contours are within normal limits. Both lungs are clear. The visualized skeletal structures are unremarkable. IMPRESSION: No active cardiopulmonary disease. Electronically Signed   By: Donavan Foil M.D.   On: 09/26/2022 20:28    Procedures Procedures    Medications Ordered in ED Medications  amoxicillin-clavulanate (AUGMENTIN) 875-125 MG per tablet 1 tablet (has no administration in time range)  oxyCODONE (Oxy IR/ROXICODONE) immediate release tablet 5 mg (5 mg Oral Given 09/26/22 1955)  ipratropium-albuterol (DUONEB) 0.5-2.5 (3) MG/3ML nebulizer solution 3 mL (3 mLs Nebulization Given 09/26/22 1954)    ED Course/ Medical Decision Making/ A&P                          Medical Decision Making Amount and/or Complexity of Data Reviewed Labs: ordered. Decision-making details documented in ED Course. Radiology: ordered.  Risk OTC drugs. Prescription drug management.    This patient presents to the ED for concern of dental abscess and salicylate poisoning, this involves an extensive number of treatment options, and is a complaint that carries with it a high risk of complications and morbidity.  I considered the following differential  and admission for this acute, potentially life threatening condition.   MDM:    Greatest concern for this patient more than the reason she came in is actually her aspirin ingestion.  Patient reportedly is taking three 325 mg aspirin every 2-3 hours except when she is sleeping x7 days, which could ultimately some to an large amount of aspirin.  She has also been concurrently taking Goody powder, has gone through 2 packs this week, unclear exactly how much Goody's powder she has ingested. Pt is 83.5 kg, and this could account for approximately 3-5g per day, ~60 mg/kg per day. Does have nausea but otherwise no symptoms of acute salicylate toxicity, will obtain a level and reassess. Patient is instructed that she cannot take so much aspirin and to take it instead by the instructions on the bottle.  For patient's facial pain, greatest concern for dental abscess. Patient has no significant facial swelling to suggest facial abscess/cellulitis, and no submandibular swelling or neck swelling to suggest ludwig's angina. Swelling also in area of parotid gland, could consider parotitis,  though patient's poor dentition and dental pain intraorally indicates dental abscess. Clear tympanic membrane and no clinical e/o mastoiditis.   For her reported heavy vaginal bleeding and new rectal bleeding. Patient refuses a rectal exam and fectal occult blood or pelvic exam. She states she is currently bleeding vaginally and unsure about rectally. Consider blood loss anemia and will check hemoglobin. She has no lightheadedness or presyncope. Will get pregnancy test and consider AUB, menorrhagia, structural abnormalities such as fibroids/adenomyosis, but without examining, difficult to make assessment. Will obtain H&H and discuss with patient further w/u if necessary.  For patient's SOB and wheezing, patient states she has a history of asthma and always feels short of breath.'s she also states she is always wheezing.  She states she  manages at home well with an albuterol inhaler.  She states she is getting over "cold" and just has "chest congestion" right now.  Will obtain a chest x-ray to evaluate for possible pneumonia, pulmonary edema, pleural effusion.  Consider possible asthma exacerbation brought on by viral illness the patient states her symptoms are not much worse than her baseline currently and she has no hypoxia, tachypnea, or increased work of breathing.  Will give patient a DuoNeb and reassess.   Clinical Course as of 09/26/22 2251  Wed Sep 26, 2022  2032 WBC: 6.6 No leukocytosis [HN]  2032 Hemoglobin: 14.1 Stable Hgb [HN]  3875 Salicylate Lvl(!): <6.4 [HN]  2051 Acetaminophen (Tylenol), S(!): <10 [HN]  2134 Creatinine(!): 1.18 At patient's baseline [HN]    Clinical Course User Index [HN] Audley Hose, MD    Labs: I Ordered, and personally interpreted labs.  The pertinent results include:  those listed above  Imaging Studies ordered: I ordered imaging studies including CXR I independently visualized and interpreted imaging. I agree with the radiologist interpretation  Additional history obtained from chart review.   Reevaluation: After the interventions noted above, I reevaluated the patient and found that they have :improved  Social Determinants of Health: Patient lives independently   Disposition:  Patient's hemoglobin is stable. She feels much better after duoneb from SOB standpoint and wheezing is gone. CXR clear, no hypoxia, no increased WOB. Likely pateint's baseline asthma and she is instructed to continue her inhalers at home and f/u o/p. Her aspirin level is negative, very reassuring. I discussed with patient the deleterious effects of aspirin overdose and instructed her to stop taking goody powder and take only as much aspirin as is indicated on the bottle. Instructed her to add tylenol 1,000 mg q8h. Patient's facial pain c/w dental abscess and will give augmentin rx but instructed  patient to f/u with dentist. She reports understanding. Will give her a few pills of oxycodone to get her to dentist appointment. Attached low cost dental options to DC instructions. Given return precautions and instructed to f/u with PCP within 1 week.    Co morbidities that complicate the patient evaluation  Past Medical History:  Diagnosis Date   Asthma    Deliberate self-cutting    Depression    Endometriosis    Epistaxis    Hypertension    Kidney stones    Kidney stones      Medicines Meds ordered this encounter  Medications   oxyCODONE (Oxy IR/ROXICODONE) immediate release tablet 5 mg   DISCONTD: acetaminophen (TYLENOL) tablet 1,000 mg   ipratropium-albuterol (DUONEB) 0.5-2.5 (3) MG/3ML nebulizer solution 3 mL   DISCONTD: amoxicillin-clavulanate (AUGMENTIN) 875-125 MG per tablet 1 tablet   amoxicillin-clavulanate (AUGMENTIN) 875-125 MG  per tablet 1 tablet    I have reviewed the patients home medicines and have made adjustments as needed  Problem List / ED Course: Problem List Items Addressed This Visit       Respiratory   Asthma   Other Visit Diagnoses     Dental abscess    -  Primary                   This note was created using dictation software, which may contain spelling or grammatical errors.    Audley Hose, MD 10/06/22 [phone]

## 2022-09-26 NOTE — ED Notes (Signed)
Pt refusing occult stool study; Dr. Mayra Neer made aware

## 2022-09-26 NOTE — ED Notes (Signed)
Pt now back in room after visiting with mother.  No acute changes noted.

## 2022-09-26 NOTE — ED Notes (Signed)
Pt ambulating to see mother being treated in separate exam room - pt awake and alert; gait steady- no distress.

## 2022-09-26 NOTE — ED Notes (Signed)
Pt awake and alert guarding L side of face.  Reports L lower dental pain x3 weeks which has worsened over last week and is now radiating into L ear;  pt also reports now also experiencing facial swelling.  RR even and unlabored on RA with symmetrical rise and fall of chest.  Current vitals obtained- pt remains hypertensive; states has been out of home bp meds for almost 1 week.  No cp/dizziness/HA reported.  Skin otherwise warm dry and intact. Will monitor for acute changes and maintain plan of care.

## 2022-09-26 NOTE — ED Triage Notes (Signed)
Patient here POV from Home.  Endorses Infection/Cavity to Left Lower Molar that has been bothersome for a month but worsened approximately 1 week.  No Drainage. No Fevers.   NAD Noted during Triage. A&Ox4. GCS 15. Ambulatory.

## 2022-11-27 ENCOUNTER — Ambulatory Visit (HOSPITAL_COMMUNITY)
Admission: EM | Admit: 2022-11-27 | Discharge: 2022-11-27 | Disposition: A | Discharge status: Home / Self Care | Payer: Self-pay | Attending: Physician Assistant | Admitting: Physician Assistant

## 2022-11-27 ENCOUNTER — Encounter (HOSPITAL_COMMUNITY): Payer: Self-pay | Admitting: *Deleted

## 2022-11-27 DIAGNOSIS — N611 Abscess of the breast and nipple: Secondary | ICD-10-CM | POA: Insufficient documentation

## 2022-11-27 DIAGNOSIS — N760 Acute vaginitis: Secondary | ICD-10-CM | POA: Insufficient documentation

## 2022-11-27 DIAGNOSIS — Z760 Encounter for issue of repeat prescription: Secondary | ICD-10-CM | POA: Insufficient documentation

## 2022-11-27 LAB — POCT URINALYSIS DIPSTICK, ED / UC
Bilirubin Urine: NEGATIVE
Glucose, UA: NEGATIVE mg/dL
Ketones, ur: NEGATIVE mg/dL
Leukocytes,Ua: NEGATIVE
Nitrite: NEGATIVE
Protein, ur: NEGATIVE mg/dL
Specific Gravity, Urine: 1.025 (ref 1.005–1.030)
Urobilinogen, UA: 0.2 mg/dL (ref 0.0–1.0)
pH: 6.5 (ref 5.0–8.0)

## 2022-11-27 LAB — POC URINE PREG, ED: Preg Test, Ur: NEGATIVE

## 2022-11-27 MED ORDER — FLUCONAZOLE 150 MG PO TABS: 150.0000 mg | ORAL_TABLET | Freq: Every day | ORAL | 0 refills | Status: AC

## 2022-11-27 MED ORDER — AMLODIPINE BESYLATE 5 MG PO TABS: 5.0000 mg | ORAL_TABLET | Freq: Two times a day (BID) | ORAL | 1 refills | Status: AC

## 2022-11-27 MED ORDER — CEPHALEXIN 500 MG PO CAPS: 500.0000 mg | ORAL_CAPSULE | Freq: Two times a day (BID) | ORAL | 0 refills | Status: AC

## 2022-11-27 NOTE — ED Triage Notes (Signed)
Pt states she has had a abscess on her left breast that started draining on the way to the clinic today. The area is read and painful.   She states that she has had urinary frequency and accidents on herself for about 3 weeks now. She has been increasing her water intake.   She also thinks she may have BV. She doesn't have any sx yet but feels like it starting.   She has been out of her BP meds x 1 month

## 2022-11-27 NOTE — Discharge Instructions (Addendum)
Take antibiotic as prescribed. Continue with warm compress.  Return if you experience increased swelling, pain, fever, chills.   Take one tablet of diflucan.  Will call with test results and change treatment plan if indicated.

## 2022-11-27 NOTE — ED Provider Notes (Signed)
[facility]    CSN: [id number] Arrival date & time: 11/27/22  1539      History   Chief Complaint Chief Complaint  Patient presents with   Urinary Frequency   Abscess   Medication Refill    HPI Samantha Yoder is a 37 y.o. female.   Wound to the left breast that started several days ago.  She has been applying warm compress.  Reports drainage started today.  She reports once drainage started pain improved.  She denies fever, chills.  She reports similar abscess in the same area before.  She also complains of of some mild vaginal itching.  This started about 1 day ago.  She denies vaginal discharge.  She denies vaginal bleeding, abdominal pain, flank pain.  She does report some increased urinary frequency.  She has been without her blood pressure medicine occasion for about 1 month now.  She is unable to check her BP at home at this time because the cuff is broken.  She does not have a PCP.  She denies chest pain, shortness of breath, palpitations, headaches, blurred vision.       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 Authorizing Provider  cephALEXin (KEFLEX) 500 MG capsule Take 1 capsule (500 mg total) by mouth 2 (two) times daily for 5 days. 11/27/22 12/02/22 Yes Ward, Lenise Arena, PA-C  fluconazole (DIFLUCAN) 150 MG tablet Take 1 tablet (150 mg total) by mouth daily for 1 day. If no improvement after 72 hours may take the second dose 11/27/22 11/28/22 Yes Ward, Lenise Arena, PA-C  acetaminophen (TYLENOL) 325 MG tablet Take 650 mg by mouth every 6 (six) hours as needed for mild pain or headache.    [provider]  albuterol (VENTOLIN HFA) 108 (90 Base) MCG/ACT inhaler albuterol sulfate HFA 90 mcg/actuation aerosol inhaler    [provider]  amLODipine (NORVASC) 5 MG tablet Take 1 tablet (5 mg total) by mouth in the morning and at bedtime. 11/27/22 01/26/23  Ward, Lenise Arena, PA-C  cetirizine (ZYRTEC) 10 MG tablet Take 1 tablet (10 mg total) by mouth daily. 04/20/22   Talbot Grumbling, FNP  predniSONE (DELTASONE) 20 MG tablet Take 2 tablets (40 mg total) by mouth daily. 04/20/22   Joella Prince  M, FNP    Family History Family History  Problem Relation Age of Onset   COPD Mother    Depression Mother    Hypertension Mother    Hypertension Father    Drug abuse Brother    Cancer Maternal Aunt    Hyperlipidemia Maternal Uncle    Miscarriages / Stillbirths Maternal Uncle    Diabetes Maternal Grandmother    Miscarriages / Stillbirths Paternal Grandmother     Social History Social History   Tobacco Use   Smoking status: Every Day    Packs/day: 0.50    Years: 0.00    Additional pack years: 0.00    Total pack years: 0.00    Types: Cigarettes   Smokeless tobacco: Never  Vaping Use   Vaping Use: Never used  Substance Use Topics   Alcohol use: Yes    Alcohol/week: 10.0 standard drinks of alcohol    Types: 5 Glasses of wine, 5 Shots of liquor per week    Comment: Occ   Drug use: Yes    Frequency: 5.0 times per week    Types: Marijuana    Comment: Occ      Allergies   Other   Review of Systems Review of Systems  Constitutional:  Negative for chills and fever.  HENT:  Negative for ear pain and sore throat.   Eyes:  Negative for pain and visual disturbance.  Respiratory:  Negative for cough and shortness of breath.   Cardiovascular:  Negative for chest pain and palpitations.  Gastrointestinal:  Negative for abdominal pain and vomiting.  Genitourinary:  Positive for frequency. Negative for dysuria and hematuria.  Musculoskeletal:  Negative for arthralgias and back pain.  Skin:  Positive for wound. Negative for color change and rash.  Neurological:  Negative for seizures and syncope.  All other systems reviewed and are negative.    Physical Exam Triage Vital Signs ED Triage Vitals  Enc Vitals Group     BP 11/27/22 1615 (!) 191/124     Pulse Rate 11/27/22 1615 74     Resp 11/27/22 1615 18     Temp 11/27/22 1615 98.4 F (36.9 C)     Temp Source 11/27/22 1615 Oral     SpO2 11/27/22 1615 98 %     Weight --      Height --      Head Circumference --      Peak Flow --      Pain Score 11/27/22 1613 5     Pain Loc --      Pain Edu? --      Excl. in [place]? --    No data found.  Updated Vital Signs BP (!) 191/124 (BP Location: Right Arm)   Pulse 74   Temp 98.4 F (36.9 C) (Oral)   Resp 18   LMP 11/20/2022 (Approximate)   SpO2 98%   Visual Acuity Right Eye Distance:   Left Eye Distance:   Bilateral Distance:    Right Eye Near:   Left Eye Near:    Bilateral Near:     Physical Exam Vitals and nursing note reviewed.  Constitutional:      General: She is not in acute distress.    Appearance: She is well-developed.  HENT:     Head: Normocephalic and atraumatic.  Eyes:     Conjunctiva/sclera: Conjunctivae normal.  Cardiovascular:     Rate and Rhythm: Normal rate and regular rhythm.     Heart sounds: No murmur heard. Pulmonary:  Effort: Pulmonary effort is normal. No respiratory distress.     Breath sounds:  Normal breath sounds.  Chest:    Abdominal:     Palpations: Abdomen is soft.     Tenderness: There is no abdominal tenderness.  Musculoskeletal:        General: No swelling.     Cervical back: Neck supple.  Skin:    General: Skin is warm and dry.     Capillary Refill: Capillary refill takes less than 2 seconds.  Neurological:     Mental Status: She is alert.  Psychiatric:        Mood and Affect: Mood normal.      UC Treatments / Results  Labs (all labs ordered are listed, but only abnormal results are displayed) Labs Reviewed  POCT URINALYSIS DIPSTICK, ED / UC - Abnormal; Notable for the following components:      Result Value   Hgb urine dipstick TRACE (*)    All other components within normal limits  POC URINE PREG, ED  CERVICOVAGINAL ANCILLARY ONLY    EKG   Radiology No results found.  Procedures Procedures (including critical care time)  Medications Ordered in UC Medications - No data to display  Initial Impression / Assessment and Plan / UC Course  I have reviewed the triage vital signs and the nursing notes.  Pertinent labs & imaging results that were available during my care of the patient were reviewed by me and considered in my medical decision making (see chart for details).     Abscess to left breast.  No I&D indicated at this time as it is draining.  Will start antibiotic and advised patient to return if symptoms become worse. Will treat with Diflucan for vaginal yeast given itching.  Cervicovaginal self swab in clinic today results pending.  Will change treatment plan if indicated based on results. Medication refill.  Patient without hypertensive medications for about a month.  She does not have a PCP.  Discussed importance of establishing care with a PCP.  Medications refilled today.  Advised to get a new cuff to continue monitoring blood pressures at home.  ED precautions given. Final Clinical Impressions(s) / UC Diagnoses   Final diagnoses:   Breast abscess  Vaginosis  Medication refill     Discharge Instructions      Take antibiotic as prescribed. Continue with warm compress.  Return if you experience increased swelling, pain, fever, chills.   Take one tablet of diflucan.  Will call with test results and change treatment plan if indicated.      ED Prescriptions     Medication Sig Dispense Auth. Provider   cephALEXin (KEFLEX) 500 MG capsule Take 1 capsule (500 mg total) by mouth 2 (two) times daily for 5 days. 10 capsule Ward, Janett Billow Z, PA-C   fluconazole (DIFLUCAN) 150 MG tablet Take 1 tablet (150 mg total) by mouth daily for 1 day. If no improvement after 72 hours may take the second dose 2 tablet Ward, Janett Billow Z, PA-C   amLODipine (NORVASC) 5 MG tablet Take 1 tablet (5 mg total) by mouth in the morning and at bedtime. 60 tablet Ward, Lenise Arena, PA-C      PDMP not reviewed this encounter.   Ward, Lenise Arena, PA-C 11/27/22 [phone]

## 2022-11-28 LAB — CERVICOVAGINAL ANCILLARY ONLY
Bacterial Vaginitis (gardnerella): POSITIVE — AB
Candida Glabrata: NEGATIVE
Candida Vaginitis: NEGATIVE
Chlamydia: NEGATIVE
Comment: NEGATIVE
Comment: NEGATIVE
Comment: NEGATIVE
Comment: NEGATIVE
Comment: NEGATIVE
Comment: NORMAL
Neisseria Gonorrhea: NEGATIVE
Trichomonas: NEGATIVE

## 2022-11-29 ENCOUNTER — Telehealth (HOSPITAL_COMMUNITY): Payer: Self-pay | Admitting: Emergency Medicine

## 2022-11-29 MED ORDER — METRONIDAZOLE 0.75 % VA GEL: 1.0000 | Freq: Every day | VAGINAL | 0 refills | Status: AC

## 2023-05-10 ENCOUNTER — Encounter: Payer: Self-pay | Admitting: Obstetrics and Gynecology

## 2023-05-17 ENCOUNTER — Telehealth: Payer: Self-pay

## 2023-05-17 NOTE — Telephone Encounter (Signed)
Patient call this morning, in reference to her referral.  It is here; however it is closed.  Please contact patient to schedule appointment

## 2023-07-22 ENCOUNTER — Ambulatory Visit: Payer: Medicaid Other | Admitting: Obstetrics and Gynecology

## 2023-12-10 ENCOUNTER — Other Ambulatory Visit: Payer: Self-pay

## 2023-12-10 ENCOUNTER — Emergency Department (HOSPITAL_BASED_OUTPATIENT_CLINIC_OR_DEPARTMENT_OTHER)
Admission: EM | Admit: 2023-12-10 | Discharge: 2023-12-10 | Disposition: A | Discharge status: Home / Self Care | Attending: Emergency Medicine | Admitting: Emergency Medicine

## 2023-12-10 ENCOUNTER — Emergency Department (HOSPITAL_BASED_OUTPATIENT_CLINIC_OR_DEPARTMENT_OTHER)

## 2023-12-10 ENCOUNTER — Encounter (HOSPITAL_BASED_OUTPATIENT_CLINIC_OR_DEPARTMENT_OTHER): Payer: Self-pay | Admitting: Emergency Medicine

## 2023-12-10 DIAGNOSIS — N838 Other noninflammatory disorders of ovary, fallopian tube and broad ligament: Secondary | ICD-10-CM

## 2023-12-10 DIAGNOSIS — N83209 Unspecified ovarian cyst, unspecified side: Secondary | ICD-10-CM | POA: Insufficient documentation

## 2023-12-10 DIAGNOSIS — K429 Umbilical hernia without obstruction or gangrene: Secondary | ICD-10-CM | POA: Diagnosis not present

## 2023-12-10 DIAGNOSIS — R109 Unspecified abdominal pain: Secondary | ICD-10-CM | POA: Diagnosis present

## 2023-12-10 LAB — URINALYSIS, ROUTINE W REFLEX MICROSCOPIC
Bacteria, UA: NONE SEEN
Bilirubin Urine: NEGATIVE
Glucose, UA: NEGATIVE mg/dL
Leukocytes,Ua: NEGATIVE
Nitrite: NEGATIVE
Protein, ur: 30 mg/dL — AB
Specific Gravity, Urine: 1.031 — ABNORMAL HIGH (ref 1.005–1.030)
pH: 7 (ref 5.0–8.0)

## 2023-12-10 LAB — CBC WITH DIFFERENTIAL/PLATELET
Abs Immature Granulocytes: 0.05 10*3/uL (ref 0.00–0.07)
Basophils Absolute: 0 10*3/uL (ref 0.0–0.1)
Basophils Relative: 0 %
Eosinophils Absolute: 0 10*3/uL (ref 0.0–0.5)
Eosinophils Relative: 0 %
HCT: 39.3 % (ref 36.0–46.0)
Hemoglobin: 13.2 g/dL (ref 12.0–15.0)
Immature Granulocytes: 0 %
Lymphocytes Relative: 11 %
Lymphs Abs: 1.2 10*3/uL (ref 0.7–4.0)
MCH: 28.9 pg (ref 26.0–34.0)
MCHC: 33.6 g/dL (ref 30.0–36.0)
MCV: 86 fL (ref 80.0–100.0)
Monocytes Absolute: 1.2 10*3/uL — ABNORMAL HIGH (ref 0.1–1.0)
Monocytes Relative: 10 %
Neutro Abs: 8.9 10*3/uL — ABNORMAL HIGH (ref 1.7–7.7)
Neutrophils Relative %: 79 %
Platelets: 272 10*3/uL (ref 150–400)
RBC: 4.57 MIL/uL (ref 3.87–5.11)
RDW: 15.1 % (ref 11.5–15.5)
WBC: 11.4 10*3/uL — ABNORMAL HIGH (ref 4.0–10.5)
nRBC: 0 % (ref 0.0–0.2)

## 2023-12-10 LAB — COMPREHENSIVE METABOLIC PANEL WITH GFR
ALT: 17 U/L (ref 0–44)
AST: 15 U/L (ref 15–41)
Albumin: 3.8 g/dL (ref 3.5–5.0)
Alkaline Phosphatase: 85 U/L (ref 38–126)
Anion gap: 12 (ref 5–15)
BUN: 10 mg/dL (ref 6–20)
CO2: 25 mmol/L (ref 22–32)
Calcium: 9.5 mg/dL (ref 8.9–10.3)
Chloride: 100 mmol/L (ref 98–111)
Creatinine, Ser: 1.14 mg/dL — ABNORMAL HIGH (ref 0.44–1.00)
GFR, Estimated: >60 mL/min (ref >60)
Glucose, Bld: 101 mg/dL — ABNORMAL HIGH (ref 70–99)
Potassium: 3.5 mmol/L (ref 3.5–5.1)
Sodium: 137 mmol/L (ref 135–145)
Total Bilirubin: 0.7 mg/dL (ref 0.0–1.2)
Total Protein: 7.4 g/dL (ref 6.5–8.1)

## 2023-12-10 LAB — HCG, QUANTITATIVE, PREGNANCY: hCG, Beta Chain, Quant, S: <1 m[IU]/mL (ref <5)

## 2023-12-10 LAB — LIPASE, BLOOD: Lipase: <10 U/L — ABNORMAL LOW (ref 11–51)

## 2023-12-10 MED ORDER — IBUPROFEN 400 MG PO TABS
600.0000 mg | ORAL_TABLET | Freq: Once | ORAL | Status: AC
  Administered 2023-12-10: 600 mg via ORAL
  Filled 2023-12-10: qty 1

## 2023-12-10 MED ORDER — LACTATED RINGERS IV BOLUS
1000.0000 mL | Freq: Once | INTRAVENOUS | Status: AC
  Administered 2023-12-10: 1000 mL via INTRAVENOUS

## 2023-12-10 MED ORDER — IOHEXOL 300 MG/ML  SOLN
100.0000 mL | Freq: Once | INTRAMUSCULAR | Status: AC | PRN
  Administered 2023-12-10: 100 mL via INTRAVENOUS

## 2023-12-10 MED ORDER — FENTANYL CITRATE PF 50 MCG/ML IJ SOSY
50.0000 ug | PREFILLED_SYRINGE | Freq: Once | INTRAMUSCULAR | Status: AC
  Administered 2023-12-10: 50 ug via INTRAVENOUS
  Filled 2023-12-10: qty 1

## 2023-12-10 MED ORDER — MORPHINE SULFATE (PF) 4 MG/ML IV SOLN
4.0000 mg | Freq: Once | INTRAVENOUS | Status: AC
  Administered 2023-12-10: 4 mg via INTRAVENOUS
  Filled 2023-12-10: qty 1

## 2023-12-10 NOTE — ED Provider Notes (Signed)
  EMERGENCY DEPARTMENT AT [facility] Provider Note   CSN: [id number] Arrival date & time: 12/10/23  8657     History  Chief Complaint  Patient presents with   Abdominal Pain    Samantha Yoder is a 38 y.o. female with pertinent history of endometriosis, umbilical hernia, history of PID/ovarian torsion.  History of endometriosis.  Currently followed by gynecology, they were considering hysterectomy and oophorectomy-but patient wanted more time to reconsider.  Currently on Aygestin and Flexeril-which has made some improvement.  She also reports a history of ovarian torsion and PID.  She denies headaches, chest pain, shortness of breath, cough, fevers, vomiting, dysuria, vaginal discharge. Abdominal pain for approximately 5 days, with constipation for approximately 10 days.  Pain is worse in the right and left lower quadrants.  She has been taking Tylenol and leftover oxycodone from a dental procedure for pain.  She is currently smoking, but denies illicit drug use.   Abdominal Pain Associated symptoms: constipation   Associated symptoms: no chest pain, no cough, no dysuria, no fever, no hematuria, no shortness of breath, no sore throat and no vomiting       Home Medications Prior to Admission medications   Medication Sig Start Date End Date Taking? Authorizing Provider  acetaminophen (TYLENOL) 325 MG tablet Take 650 mg by mouth every 6 (six) hours as needed for mild pain or headache.    [provider]  albuterol (VENTOLIN HFA) 108 (90 Base) MCG/ACT inhaler albuterol sulfate HFA 90 mcg/actuation aerosol inhaler    [provider]  amLODipine (NORVASC) 5 MG tablet Take 1 tablet (5 mg total) by mouth in the morning and at bedtime. 11/27/22 01/26/23  Ward, Tylene Fantasia, PA-C  cetirizine (ZYRTEC) 10 MG tablet Take 1 tablet (10 mg total) by mouth daily. 04/20/22   Carlisle Beers, FNP  predniSONE (DELTASONE) 20 MG tablet Take 2 tablets (40 mg total) by  mouth daily. 04/20/22   Carlisle Beers, FNP      Allergies    Other    Review of Systems   Review of Systems  Constitutional:  Negative for fever.  HENT:  Negative for sore throat.   Respiratory:  Negative for cough and shortness of breath.   Cardiovascular:  Negative for chest pain and palpitations.  Gastrointestinal:  Positive for abdominal pain and constipation. Negative for vomiting.  Genitourinary:  Negative for dysuria and hematuria.  Skin:  Negative for rash.    Physical Exam Updated Vital Signs BP (!) 156/106 (BP Location: Right Arm)   Pulse 95   Temp 98.7 F (37.1 C) (Oral)   Resp 18   Ht 4\' 10"  (1.473 m)   Wt 78 kg   LMP 11/24/2023   SpO2 96%   BMI 35.95 kg/m  Physical Exam Constitutional:      Appearance: She is obese.  HENT:     Head: Normocephalic and atraumatic.  Eyes:     Extraocular Movements: Extraocular movements intact.     Pupils: Pupils are equal, round, and reactive to light.  Cardiovascular:     Rate and Rhythm: Normal rate and regular rhythm.     Heart sounds: Normal heart sounds.  Pulmonary:     Effort: Pulmonary effort is normal.     Breath sounds: Normal breath sounds.  Abdominal:     General: Abdomen is protuberant. Bowel sounds are normal. There are no signs of injury.     Palpations: Abdomen is soft. There is no hepatomegaly or  splenomegaly.     Tenderness: There is generalized abdominal tenderness. There is no rebound.     Hernia: A hernia is present. Hernia is present in the umbilical area.  Skin:    General: Skin is warm and dry.     Capillary Refill: Capillary refill takes less than 2 seconds.  Neurological:     Mental Status: She is alert.     ED Results / Procedures / Treatments   Labs (all labs ordered are listed, but only abnormal results are displayed) Labs Reviewed  CBC WITH DIFFERENTIAL/PLATELET - Abnormal; Notable for the following components:      Result Value   WBC 11.4 (*)    Neutro Abs 8.9 (*)     Monocytes Absolute 1.2 (*)    All other components within normal limits  COMPREHENSIVE METABOLIC PANEL WITH GFR - Abnormal; Notable for the following components:   Glucose, Bld 101 (*)    Creatinine, Ser 1.14 (*)    All other components within normal limits  LIPASE, BLOOD - Abnormal; Notable for the following components:   Lipase <10 (*)    All other components within normal limits  URINALYSIS, ROUTINE W REFLEX MICROSCOPIC - Abnormal; Notable for the following components:   Specific Gravity, Urine 1.031 (*)    Hgb urine dipstick SMALL (*)    Ketones, ur TRACE (*)    Protein, ur 30 (*)    All other components within normal limits  HCG, QUANTITATIVE, PREGNANCY  PREGNANCY, URINE    EKG None  Radiology US PELVIC TRANSABD W/PELVIC DOPPLER Result Date: 12/10/2023 CLINICAL DATA:  [id number] Pelvic mass [id number] EXAM: TRANSABDOMINAL ULTRASOUND OF PELVIS DOPPLER ULTRASOUND OF OVARIES TECHNIQUE: Transabdominal ultrasound examination of the pelvis was performed including evaluation of the uterus, ovaries, adnexal regions, and pelvic cul-de-sac. Color and duplex Doppler ultrasound was utilized to evaluate blood flow to the ovaries. COMPARISON:  CT scan abdomen and pelvis from earlier the same day. FINDINGS: Uterus Measurements: 4.5 x 5.3 x 14.2 cm = volume: 177.9 mL. There are several heterogeneous uterine masses with largest measuring up to 2.7 x 4.2 x 5.0 cm, incompletely characterized on ultrasound exam, but favored to represent leiomyomas. Endometrium Thickness: 9.0 mm.  No focal abnormality visualized. Right ovary Left ovary Bilateral ovaries are not distinctly visualized. However, there are multiple mixed solid/cystic, but predominantly cystic masses in the pelvis. The largest mass measured in the right adnexa measures up to 10 x 12 cm. The largest mass in the left adnexa measures up to 10.8 x 15.3 cm. The masses contain low level homogeneous echoes. There is fluid-fluid level in right adnexal lesions. No  discrete internal vascularity seen on the provided images. These are incompletely characterized on the ultrasound due to large size. Clinical correlation and further evaluation with contrast-enhanced MRI pelvis is recommended. The differential diagnosis is nonspecific and includes multiple endometriomas, pelvic inflammatory disease, cystic neoplasm, etc. Pulsed Doppler evaluation demonstrates normal low-resistance arterial and venous waveforms in both ovaries. Other: None. IMPRESSION: 1. Multiple mixed solid/cystic, but predominantly cystic masses in the pelvis, as described above. These are incompletely characterized on the ultrasound exam. Further evaluation with contrast-enhanced MRI pelvis is recommended. 2. Leiomyomatous uterus. Electronically Signed   By: Jules Schick M.D.   On: 12/10/2023 11:58   CT ABDOMEN PELVIS W CONTRAST Result Date: 12/10/2023 CLINICAL DATA:  Abdominal pain. EXAM: CT ABDOMEN AND PELVIS WITH CONTRAST TECHNIQUE: Multidetector CT imaging of the abdomen and pelvis was performed using the standard protocol following bolus administration of  intravenous contrast. RADIATION DOSE REDUCTION: This exam was performed according to the departmental dose-optimization program which includes automated exposure control, adjustment of the mA and/or kV according to patient size and/or use of iterative reconstruction technique. CONTRAST:  OMNIPAQUE IOHEXOL 300 MG/ML  SOLN COMPARISON:  CT abdomen pelvis dated 01/31/2022. FINDINGS: Lower chest: The visualized lung bases are clear. No intra-abdominal free air.  Small free fluid in the pelvis. Hepatobiliary: The liver is unremarkable. Mild dilatation, post cholecystectomy. No retained calcified stone noted in the central CBD. Pancreas: Unremarkable. No pancreatic ductal dilatation or surrounding inflammatory changes. Spleen: Normal in size without focal abnormality. Adrenals/Urinary Tract: The adrenal glands are unremarkable. There is no hydronephrosis  on either side. The urinary bladder is mildly distended and grossly unremarkable. Stomach/Bowel: There is no bowel obstruction or active inflammation. The appendix is normal. Vascular/Lymphatic: The abdominal aorta and IVC are unremarkable. No portal venous gas. There is no adenopathy. Reproductive: The uterus is anteverted. Anterior body fibroid noted. Large bilateral adnexal cystic lesions, significantly increased in size since the prior CT concerning for cystic neoplasm. Gynecology consult and further evaluation with pelvic MRI as per gynecology recommendation, advised. The left adnexal cystic lesion measures approximately 14 x 8 cm in greatest axial dimensions (previously approximately 6 x 10 cm. Other: Small fat containing umbilical hernia. Slight stranding of the herniated fat, new since the prior CT and may represent an inflammatory process. Correlation with clinical exam and point tenderness recommended to evaluate for possibility of strangulation/incarceration. Musculoskeletal: Right femoral intramedullary nail as well as fixation screws of the pelvis. No acute osseous pathology. IMPRESSION: 1. Large bilateral adnexal cystic lesions, increased in size since the prior CT and concerning for cystic neoplasm. Gynecology consult recommended. 2. No bowel obstruction. Normal appendix. 3. Small fat containing umbilical hernia. Correlation with clinical exam recommended to exclude possibility of strangulation/incarceration. Electronically Signed   By: Elgie Collard M.D.   On: 12/10/2023 10:26    Procedures Procedures    Medications Ordered in ED Medications  morphine (PF) 4 MG/ML injection 4 mg (4 mg Intravenous Given 12/10/23 0759)  lactated ringers bolus 1,000 mL (0 mLs Intravenous Stopped 12/10/23 0853)  iohexol (OMNIPAQUE) 300 MG/ML solution 100 mL (100 mLs Intravenous Contrast Given 12/10/23 0854)  ibuprofen (ADVIL) tablet 600 mg (600 mg Oral Given 12/10/23 1051)  morphine (PF) 4 MG/ML injection 4 mg (4  mg Intravenous Given 12/10/23 1257)    ED Course/ Medical Decision Making/ A&P                                 Medical Decision Making 38 year old female with abdominal pain and constipation for 10 days. Hx significant for endometriosis, umbilical hernia, history of PID, history of ovarian cyst.  No urinary symptoms, low suspicion for urinary pathology. Differential is broad and includes: Pancreatitis, cholecystitis, SBO, constipation, incarcerated hernia, ovarian cyst/torsion, endometriosis.  Will obtain CBC, BMP, lipase, UPT, CT abdomen pelvis with contrast.  Will start morphine for pain, and bolus 1 L LR.  Independently reviewed labs and imaging. Lipase unremarkable. Serum pregnancy negative. BMP unremarkable, creatinine of 1.14 appears to be near baseline.  WBC of 11.4, with elevated absolute neutrophil count.  UA with mild proteinuria and ketones, suspect 2/2 dehydration given poor p.o. over the past few days.  CT abdomen/pelvis: Umbilical hernia, two large mass lesions possibly ovarian, significantly enlarged from prior CT May 2023-pending official read.  Official read from radiology: Large  bilateral adnexal cystic lesions, concerning for cystic neoplasm.  Recommendation for gynecology consult.  Spoke with on-call gynecology at [facility], they recommend contacting patient's GYN-ONC at [facility].  They remain available for consult if unable to transfer to [facility].  In the meantime, we will obtain U/S. Doppler of ovaries to assess for torsion.  Plan to speak with [facility].  Independently reviewed ultrasound pelvis, mixed solid/cystic adnexal masses bilaterally.  Adequate Doppler flow to both ovaries, lower concern for torsion.  Agree with official radiology read.  On reevaluation patient is still quite tender to palpation in her lower right and left quadrants.  Morphine helped with pain, will re-dose. Notified by [facility], consult was placed for GYN-ONC.  Spoke with Dr. Blair Promise, unfortunately  images were not power shared with [facility] and she cannot review. Discussed clinical picture, and described CT/ultrasound images.  Per Dr. Alessandra Grout, patient does not follow for oncologic diagnosis, but rather endometriosis. She does have concern for ovarian torsion based on dimensions of lesions + clinical symptoms. She is requesting gynecologic consult, for consideration of surgery. Recommend reaching out to our gynecologist or ED to ED transfer.  Spoke with Dr. Berton Lan, she will review case and get back to us-they believe patient will be better served at [facility], and suggest ED transfer.  Spoke with French Ana from [facility], she will notify Dr. Alessandra Grout of ED transfer and call us back.  Patient was signed out to Dr. Lockie Mola, see his attestation for further details.  Amount and/or Complexity of Data Reviewed Labs: ordered. Radiology: ordered.  Risk Prescription drug management.         Final Clinical Impression(s) / ED Diagnoses Final diagnoses:  Ovarian mass    Rx / DC Orders ED Discharge Orders     None         Tiffany Kocher, DO 12/10/23 1425    Virgina Norfolk, DO 12/10/23 1530

## 2023-12-10 NOTE — ED Triage Notes (Signed)
 Pt crying in triage. Pt c/o abd pain, hx of endometriosis. Also endorses constipation x 10 days.

## 2023-12-10 NOTE — ED Notes (Signed)
 Dr. Tamela Oddi transferred to attending-[phone]

## 2023-12-10 NOTE — ED Provider Notes (Addendum)
 Supervised the resident visit.  Patient here with abdominal pain.  Differential diagnosis bowel obstruction constipation endometriosis fibroid less likely appendicitis UTI check CBC CMP lipase urinalysis.  She was recommended for hysterectomy in the past for her endometriosis.  She also states she has not had a bowel movement in several days.  She denies any nausea or vomiting.  Ultimately we will check labs check a CT scan of the abdomen pelvis.  Please see resident note for further results, evaluation, disposition of the patient.  CT scan showed ovarian masses ultrasound showed multiple mixed solid cystic but predominantly cystic masses in the pelvis.  Did not appear to have any evidence of torsion on ultrasound however given the size of the masses in her comfort concerned that there could still be ovarian torsion intermittently.  My resident talked with Dr. Berton Lan here with [facility] but recommended that we call [facility] with her primary OB is there.  We were able to get the patient to be accepted for transfer by Dr. Cochrane with ob/gyn. Plan for ED to ED transfer to [facility] for them to eval for need to take to OR for possible torsion r/o. Patient hemodynamically stable throughout my care.  This chart was dictated using voice recognition software.  Despite best efforts to proofread,  errors can occur which can change the documentation meaning.    Virgina Norfolk, DO 12/10/23 1610    Virgina Norfolk, DO 12/10/23 1417    Lockie Mola, Briauna Gilmartin, DO 12/10/23 1434

## 2023-12-10 NOTE — ED Notes (Signed)
 [facility] transport at bedside

## 2024-03-13 ENCOUNTER — Ambulatory Visit
Admission: EM | Admit: 2024-03-13 | Discharge: 2024-03-13 | Disposition: A | Discharge status: Home / Self Care | Attending: Family Medicine | Admitting: Family Medicine

## 2024-03-13 ENCOUNTER — Encounter: Payer: Self-pay | Admitting: Emergency Medicine

## 2024-03-13 DIAGNOSIS — I10 Essential (primary) hypertension: Secondary | ICD-10-CM | POA: Diagnosis not present

## 2024-03-13 DIAGNOSIS — H1131 Conjunctival hemorrhage, right eye: Secondary | ICD-10-CM

## 2024-03-13 MED ORDER — SYSTANE 0.4-0.3 % OP SOLN: 1.0000 [drp] | Freq: Four times a day (QID) | OPHTHALMIC | 0 refills | Status: AC

## 2024-03-13 NOTE — Discharge Instructions (Signed)
 You have a broken blood vessel in your eye.  This should go away with time.  If you have any foreign body sensation or additional vision change, light sensitivity, headache, nausea, vomiting you need to go to the emergency room.  If this is not improving quickly please follow-up with ophthalmology; call to schedule an appointment.  This does not typically require treatment but I have called in some lubricating eyedrops to help with your symptoms.    Your blood pressure is very elevated.  Please go home and take your medicine.  Monitor this regularly.  Follow-up with your primary care.  If you develop any chest pain, shortness of breath, headache, vision change, dizziness in setting of high blood pressure you need to go to the ER.

## 2024-03-13 NOTE — ED Provider Notes (Signed)
 [facility]    CSN: [id number] Arrival date & time: 03/13/24  1553      History   Chief Complaint Chief Complaint  Patient presents with   Eye Problem    HPI Samantha Yoder is a 38 y.o. female.   Patient presents today with a several hour history of red lesion in her right eye.  Reports that just before symptoms began she had a very forceful sneeze and then noticed that there was an unusual sensation in her eye.  When she looked she saw a blood red bump in the lateral inferior portion of her eye.  She does wear glasses but does not wear contacts.  She denies any visual changes, photophobia, nausea, vomiting, headache.  She does not take any blood thinning medication.  She denies any ocular trauma or exposure to chemicals or fine particulate matter.  She has not tried any over-the-counter medication for symptom management.  Patient was noted to have very elevated blood pressure.  She has a history of hypertension and reports that she has not taken her medication recently.  She does have it available at home and will take it as soon as she gets home.  She can monitor her blood pressure.  She denies any chest pain, shortness of breath, headache, vision change, dizziness.     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 Authorizing Provider  Polyethyl Glycol-Propyl Glycol (SYSTANE) 0.4-0.3 % SOLN Apply 1 drop to eye 4 (four) times daily. 03/13/24  Yes Jaccob Czaplicki K, PA-C  acetaminophen  (TYLENOL ) 325 MG tablet Take 650 mg by mouth every 6 (six) hours as needed for mild pain or headache.    [provider]  albuterol  (VENTOLIN  HFA) 108 (90 Base) MCG/ACT inhaler albuterol  sulfate HFA 90 mcg/actuation aerosol inhaler    [provider]  amLODipine  (NORVASC ) 5 MG tablet Take 1 tablet (5 mg total) by mouth in the morning and at bedtime. 11/27/22 01/26/23  Ward, Harlene PEDLAR, PA-C  cetirizine  (ZYRTEC ) 10 MG tablet Take 1 tablet (10 mg total) by mouth daily. 04/20/22   Enedelia Dorna HERO, FNP  predniSONE  (DELTASONE ) 20 MG tablet Take 2 tablets (40 mg total) by mouth daily. 04/20/22   Enedelia Dorna HERO, FNP  Family History Family History  Problem Relation Age of Onset   COPD Mother    Depression Mother    Hypertension Mother    Hypertension Father    Drug abuse Brother    Cancer Maternal Aunt    Hyperlipidemia Maternal Uncle    Miscarriages / Stillbirths Maternal Uncle    Diabetes Maternal Grandmother    Miscarriages / Stillbirths Paternal Grandmother     Social History Social History   Tobacco Use   Smoking status: Every Day    Current packs/day: 0.50    Types: Cigarettes   Smokeless tobacco: Never  Vaping Use   Vaping status: Never Used  Substance Use Topics   Alcohol use: Yes    Alcohol/week: 10.0 standard drinks of alcohol    Types: 5 Glasses of wine, 5 Shots of liquor per week    Comment: Occ   Drug use: Yes    Frequency: 5.0 times per week    Types: Marijuana    Comment: Occ     Allergies   Other   Review of Systems Review  of Systems  Constitutional:  Positive for activity change. Negative for appetite change, fatigue and fever.  Eyes:  Positive for redness. Negative for photophobia, pain, discharge, itching and visual disturbance.  Respiratory:  Negative for shortness of breath.   Cardiovascular:  Negative for chest pain.  Gastrointestinal:  Negative for abdominal pain, diarrhea, nausea and vomiting.  Neurological:  Negative for dizziness, light-headedness and headaches.     Physical Exam Triage Vital Signs ED Triage Vitals  Encounter Vitals Group     BP 03/13/24 1624 (!) 176/123     Girls Systolic BP Percentile --      Girls Diastolic BP Percentile --      Boys Systolic BP Percentile --      Boys Diastolic BP Percentile --      Pulse Rate 03/13/24 1624 68     Resp 03/13/24 1624 16     Temp 03/13/24 1624 98.1 F (36.7 C)     Temp Source 03/13/24 1624 Oral     SpO2 03/13/24 1624 96 %     Weight --      Height --      Head Circumference --      Peak Flow --      Pain Score 03/13/24 1622 2     Pain Loc --      Pain Education --      Exclude from Growth Chart --    No data found.  Updated Vital Signs BP (!) 176/123 (BP Location: Left Arm)   Pulse 68   Temp 98.1 F (36.7 C) (Oral)   Resp 16   LMP 11/24/2023   SpO2 96%   Visual Acuity Right Eye Distance: 20/15 (with glasses) Left Eye Distance: 20/15 (with glasses.) Bilateral Distance: 20/10 (with glasses)  Right Eye Near:   Left Eye Near:    Bilateral Near:     Physical Exam Vitals reviewed.  Constitutional:      General: She is awake. She is not in acute distress.    Appearance: Normal appearance. She is well-developed. She is not ill-appearing.     Comments: Very pleasant female appears stated age in no acute distress sitting comfortably in exam room  HENT:     Head: Normocephalic and atraumatic.  Eyes:     Extraocular Movements: Extraocular movements intact.     Conjunctiva/sclera:     Right eye: Right conjunctiva is not  injected. Chemosis and hemorrhage present.     Left eye: No hemorrhage.    Pupils: Pupils are equal, round, and reactive to light.     Right eye: No corneal abrasion or fluorescein uptake. Seidel exam negative.   Cardiovascular:     Rate and Rhythm: Normal rate and regular rhythm.     Heart sounds: Normal heart sounds, S1 normal and S2 normal. No murmur heard. Pulmonary:     Effort: Pulmonary effort is normal.     Breath sounds: Normal breath sounds. No wheezing, rhonchi or rales.     Comments: Clear to auscultation bilaterally Psychiatric:        Behavior: Behavior is cooperative.      UC Treatments / Results  Labs (all labs ordered are listed, but only abnormal results are displayed) Labs Reviewed - No data to display  EKG   Radiology No results found.  Procedures Procedures (including critical care time)  Medications Ordered in UC Medications - No data to display  Initial Impression / Assessment and Plan / UC Course  I have reviewed the triage vital signs and the nursing notes.  Pertinent labs & imaging results that were available during my care of the patient were reviewed by me and considered in my medical decision making (see chart for details).     Patient is well-appearing, afebrile, nontoxic, nontachycardic.  Symptoms are consistent with conjunctival hemorrhage.  We discussed that this is generally benign and will resolve without intervention but she was given lubricating eyedrops to help with any irritation.   We discussed that if this is not improving she should follow-up with ophthalmology and was given contact information for local provider with instruction to call to schedule an appointment.  Discussed that if anything worsens and he has visual disturbance, enlarging lesion, eye pain, fever, nausea, vomiting she needs to be seen emergently.  Strict return precautions given.  Her blood pressure is very elevated.  This appears to be her baseline.  She reports  that she has not been taking her medication regularly but will go home and take it and then monitor her blood pressure at home.  She denies any signs/symptoms of endorgan damage.  She was instructed to avoid NSAIDs, decongestants, caffeine, sodium.  Recommend close follow-up with her primary care.  We discussed that if anything worsens or changes and she has chest pain, shortness of breath, headache, vision change, dizziness in setting of high blood pressure she needs to go to the ER.  Return precautions given.  All questions were answered to patient satisfaction.  Final Clinical Impressions(s) / UC Diagnoses   Final diagnoses:  Conjunctival hemorrhage of right eye  Elevated blood pressure reading with diagnosis of hypertension     Discharge Instructions      You have a broken blood vessel in your eye.  This should go away with time.  If you have any foreign body sensation or additional vision change, light sensitivity, headache, nausea, vomiting you need to go to the emergency room.  If this is not improving quickly please follow-up with ophthalmology; call to schedule an appointment.  This does not typically require treatment but I have called in some lubricating eyedrops to help with your symptoms.    Your blood pressure is very elevated.  Please go home and take your medicine.  Monitor this regularly.  Follow-up with your primary care.  If you develop any chest pain, shortness of breath, headache, vision change, dizziness in setting of high blood pressure  you need to go to the ER.     ED Prescriptions     Medication Sig Dispense Auth. Provider   Polyethyl Glycol-Propyl Glycol (SYSTANE) 0.4-0.3 % SOLN Apply 1 drop to eye 4 (four) times daily. 3 mL Jarick Harkins K, PA-C      PDMP not reviewed this encounter.   Sherrell Rocky POUR, PA-C 03/13/24 1655

## 2024-03-13 NOTE — ED Triage Notes (Signed)
 Pt presents with right eye redness for a few hours. Denies any obvious injuries or trauma to the right eye. States she did sneeze and tried to hold it and believes a blood vessel may have popped. Denies any vision changes

## 2024-03-24 IMAGING — CT CT ABD-PELV W/ CM
2 of 4 series · 16 of 46 positions shown, 18 images · IV contrast (APPLIED)
Comparison: 03/30/2020

CLINICAL DATA: Abdominal pain, acute, nonlocalized. Spotting. Pain
with urination. History of kidney stones.

EXAM:
CT ABDOMEN AND PELVIS WITH CONTRAST
TECHNIQUE: Multidetector CT imaging of the abdomen and pelvis was performed
using the standard protocol following bolus administration of
intravenous contrast.

[Series 2: abd pel w · axial · 0.98mm/px · z∈[-690,-255]mm · 13 of 95 slices shown, 15 images]
[im 4/95  soft-tissue]
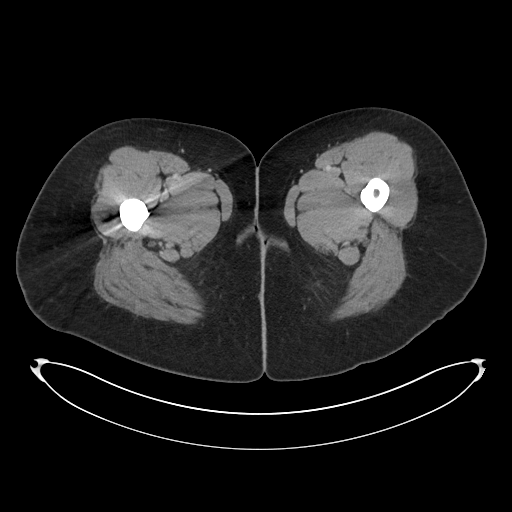
[im 4/95  bone]
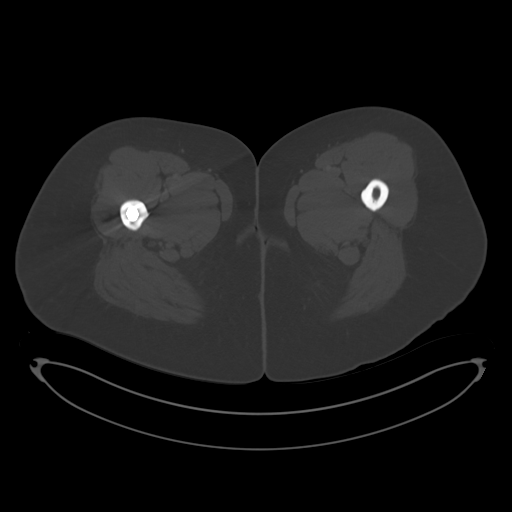
[im 12/95  soft-tissue]
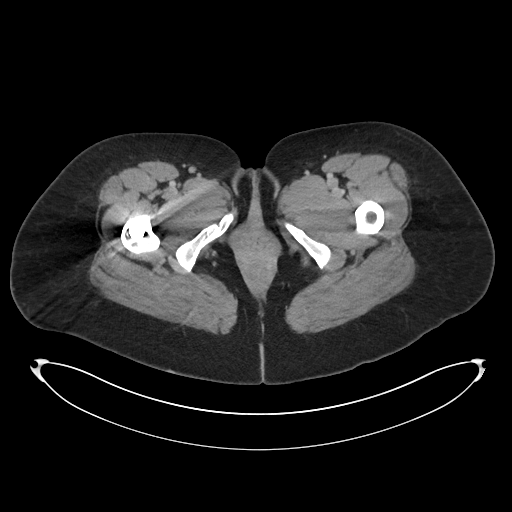
[im 20/95  soft-tissue]
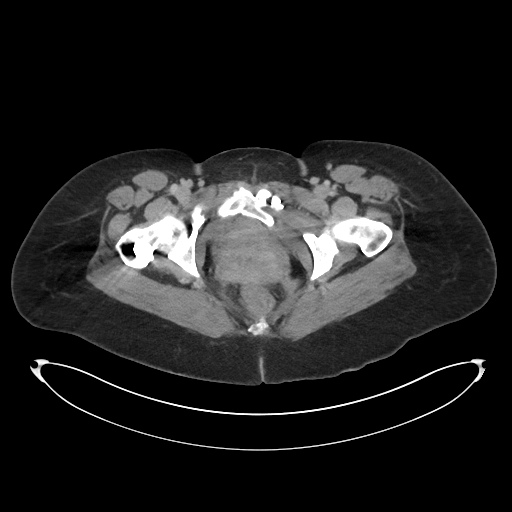
[im 28/95  soft-tissue]
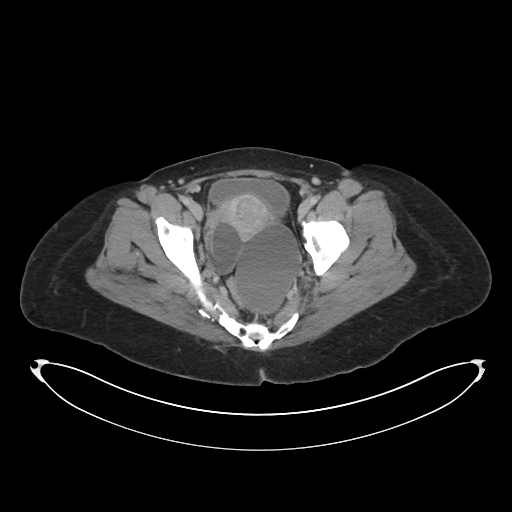
[im 32/95  soft-tissue]
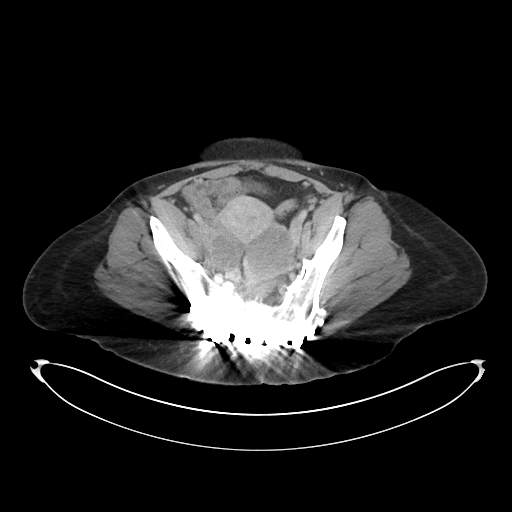
[im 40/95  soft-tissue]
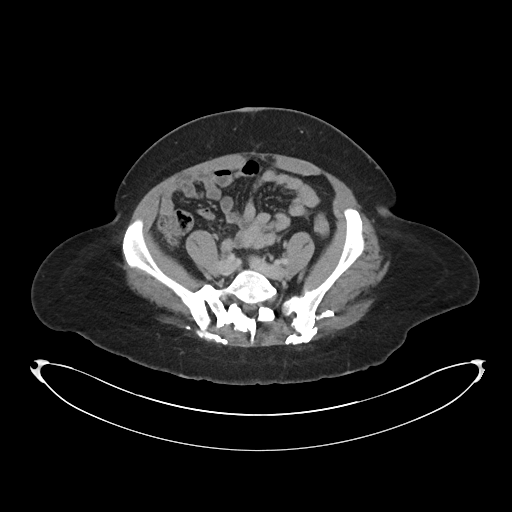
[im 48/95  soft-tissue]
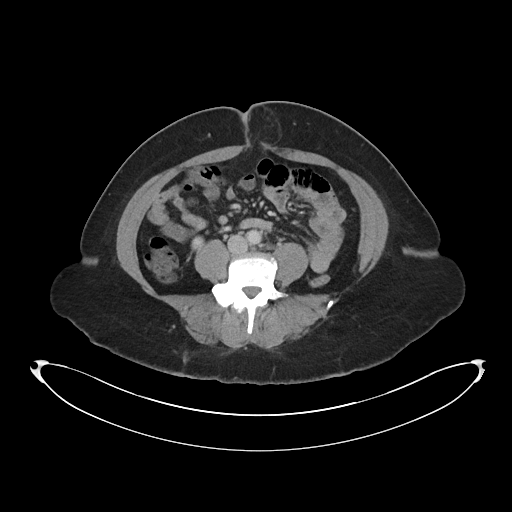
[im 55/95  soft-tissue]
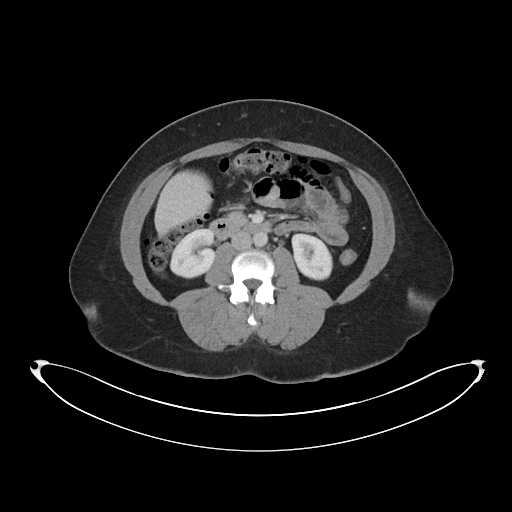
[im 63/95  soft-tissue]
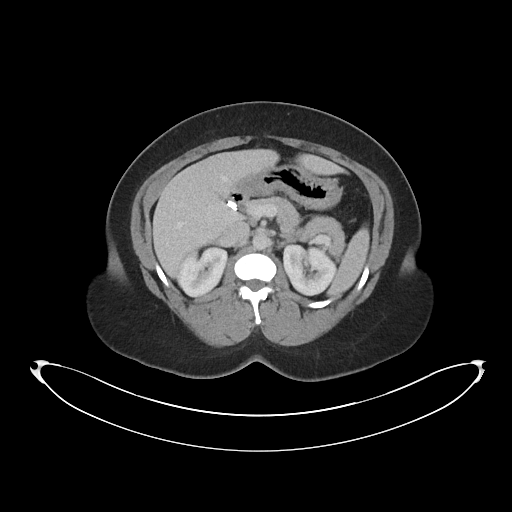
[im 63/95  bone]
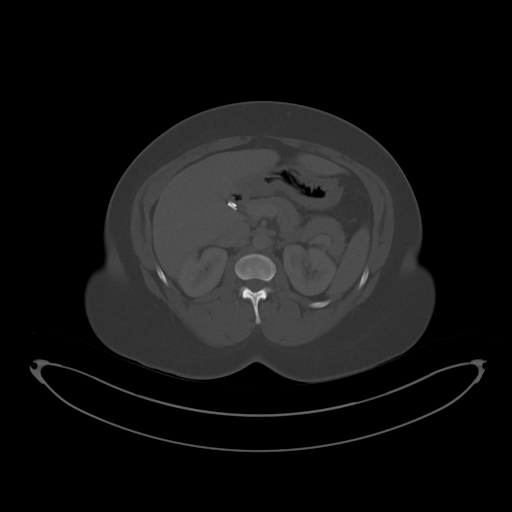
[im 67/95  soft-tissue]
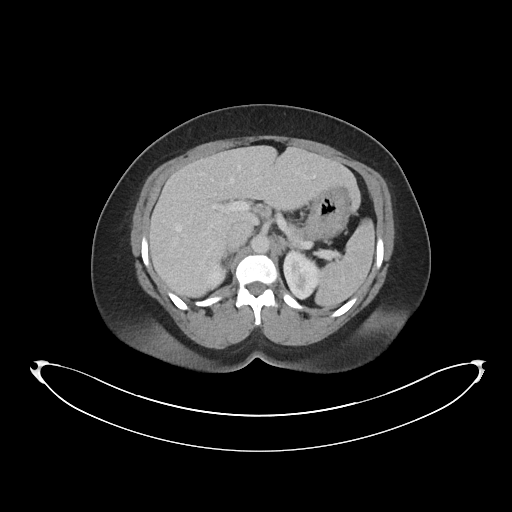
[im 75/95  soft-tissue]
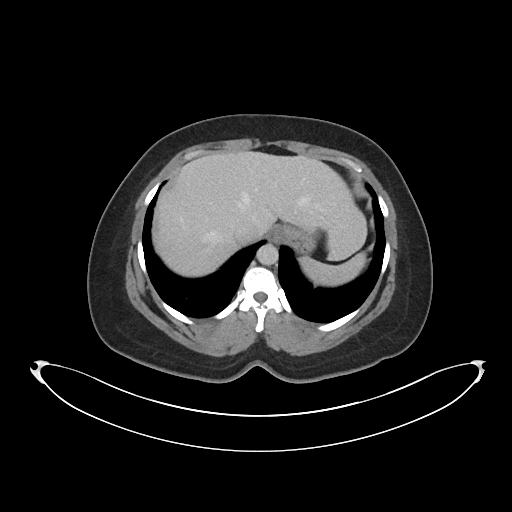
[im 83/95  soft-tissue]
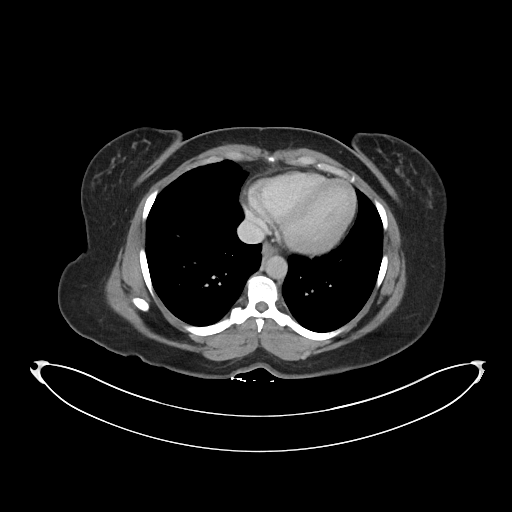
[im 91/95  soft-tissue]
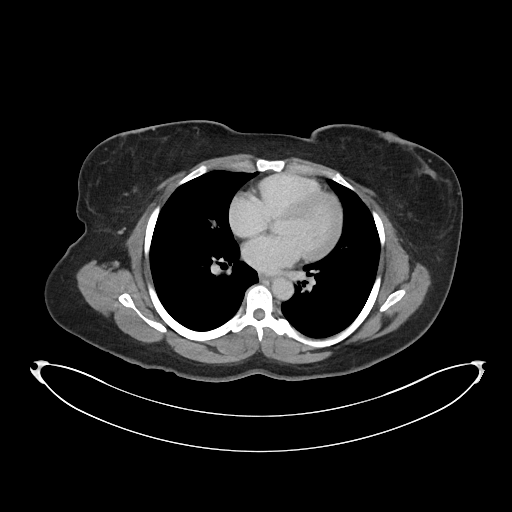

[Series 5: coronal (person_name) · coronal · 0.86mm/px · 3 of 95 slices shown]
[im 32/95  soft-tissue]
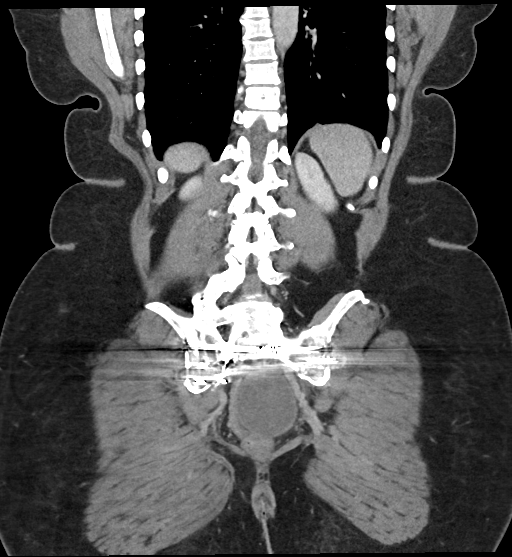
[im 42/95  soft-tissue]
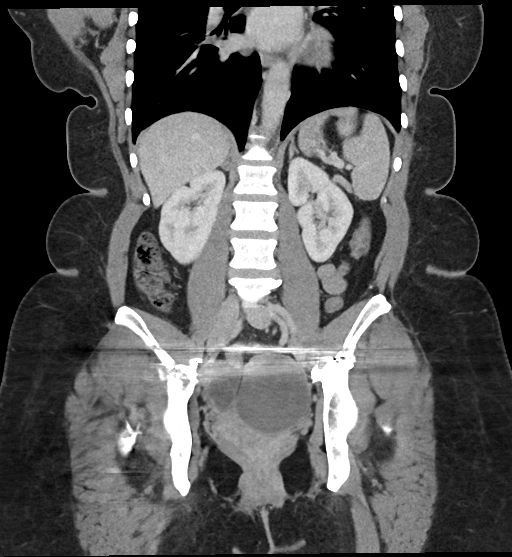
[im 53/95  soft-tissue]
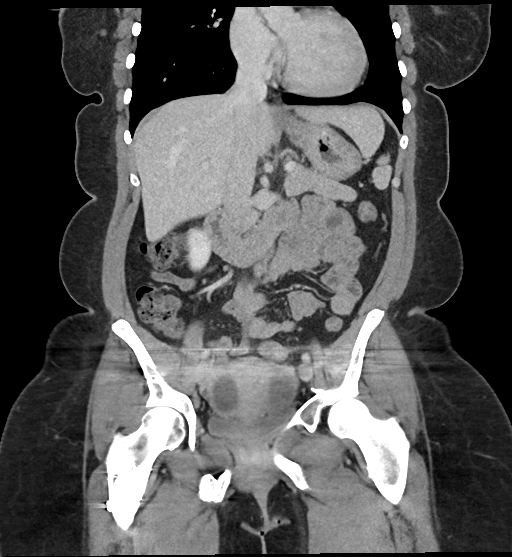

[16 of 46 positions shown; findings below may reference images not displayed]

RADIATION DOSE REDUCTION: This exam was performed according to the
departmental dose-optimization program which includes automated
exposure control, adjustment of the mA and/or kV according to
patient size and/or use of iterative reconstruction technique.

CONTRAST:  80mL OMNIPAQUE IOHEXOL 300 MG/ML  SOLN
FINDINGS: Lower chest: Normal

Hepatobiliary: Liver parenchyma is normal. Previous cholecystectomy.

Pancreas: Normal

Spleen: Normal

Adrenals/Urinary Tract: Adrenal glands are normal. Kidneys are
normal. Bladder is normal.

Stomach/Bowel: Stomach and small intestine are normal. Normal
appendix. No abnormal colon finding.

Vascular/Lymphatic: Normal

Reproductive: 2 cm leiomyoma of the ventral body of the uterus.
Slight enlargement of a right adnexal cyst, now measuring up to
cm in size. Slight enlargement of the left adnexal cystic lesion,
now measuring 9.7 x 6.4 by 7.0 cm. Medial wall nodule previously
seen is partially obscured by streak artifact.

Other: No free fluid or air.

Musculoskeletal: Chronic post traumatic findings of the pelvis as
seen previously.
IMPRESSION: Previous cholecystectomy.

Ventral hernias containing only fat, the largest in the
periumbilical region, increased in size since [DATE] cm leiomyoma of the uterus in the ventral body.

Bilateral cystic adnexal lesions. Slight enlargement on the right,
now measuring up to 4.9 cm in size. Further enlargement on the left
measuring 9.7 x 6.4 x 7.0 cm. The differential diagnosis is
endometriomas versus benign cysts versus cystic neoplasms. Follow-up
by US is recommended in 3-6 months. Note: This recommendation does
not apply to premenarchal patients and to those with increased risk
(genetic, family history, elevated tumor markers or other high-risk
factors) of ovarian cancer. Reference: JACR [DATE]):[id number]
